# Patient Record
Sex: Male | Born: 1955
Health system: Southern US, Community
[De-identification: ages and names within clinical notes are randomized; demographics above are authoritative.]

## PROBLEM LIST (undated history)

## (undated) DIAGNOSIS — I10 Essential (primary) hypertension: Secondary | ICD-10-CM

## (undated) DIAGNOSIS — M1990 Unspecified osteoarthritis, unspecified site: Secondary | ICD-10-CM

## (undated) DIAGNOSIS — E119 Type 2 diabetes mellitus without complications: Secondary | ICD-10-CM

## (undated) HISTORY — PX: COLONOSCOPY: SHX174

## (undated) HISTORY — PX: HERNIA REPAIR: SHX51

## (undated) HISTORY — PX: ROTATOR CUFF REPAIR: SHX139

## (undated) HISTORY — PX: KNEE SURGERY: SHX244

---

## 1999-05-20 ENCOUNTER — Encounter: Payer: Self-pay | Admitting: Emergency Medicine

## 1999-05-20 ENCOUNTER — Emergency Department (HOSPITAL_COMMUNITY): Admission: EM | Admit: 1999-05-20 | Discharge: 1999-05-20 | Payer: Self-pay | Admitting: Emergency Medicine

## 2000-12-25 ENCOUNTER — Ambulatory Visit (HOSPITAL_COMMUNITY): Admission: RE | Admit: 2000-12-25 | Discharge: 2000-12-25 | Payer: Self-pay | Admitting: *Deleted

## 2000-12-25 ENCOUNTER — Encounter: Payer: Self-pay | Admitting: *Deleted

## 2002-08-18 HISTORY — PX: ROTATOR CUFF REPAIR: SHX139

## 2003-08-19 HISTORY — PX: KNEE SURGERY: SHX244

## 2008-04-27 ENCOUNTER — Ambulatory Visit (HOSPITAL_BASED_OUTPATIENT_CLINIC_OR_DEPARTMENT_OTHER): Admission: RE | Admit: 2008-04-27 | Discharge: 2008-04-27 | Payer: Self-pay | Admitting: Surgery

## 2009-02-01 ENCOUNTER — Ambulatory Visit (HOSPITAL_COMMUNITY): Admission: RE | Admit: 2009-02-01 | Discharge: 2009-02-02 | Payer: Self-pay | Admitting: Urology

## 2009-02-20 ENCOUNTER — Ambulatory Visit (HOSPITAL_COMMUNITY): Admission: RE | Admit: 2009-02-20 | Discharge: 2009-02-20 | Payer: Self-pay | Admitting: Urology

## 2010-11-25 LAB — CBC
HCT: 47.7 % (ref 39.0–52.0)
Hemoglobin: 15.6 g/dL (ref 13.0–17.0)
MCHC: 32.7 g/dL (ref 30.0–36.0)
MCV: 88 fL (ref 78.0–100.0)
Platelets: 214 10*3/uL (ref 150–400)
RBC: 5.42 MIL/uL (ref 4.22–5.81)
RDW: 12.4 % (ref 11.5–15.5)
WBC: 6.3 10*3/uL (ref 4.0–10.5)

## 2010-11-25 LAB — ABO/RH: ABO/RH(D): O POS

## 2010-11-25 LAB — PROTIME-INR
INR: 1 (ref 0.00–1.49)
Prothrombin Time: 13.3 seconds (ref 11.6–15.2)

## 2010-11-25 LAB — TYPE AND SCREEN
ABO/RH(D): O POS
Antibody Screen: NEGATIVE

## 2010-12-31 NOTE — Op Note (Signed)
NAME:  JAMAREE, HOSIER NO.:  [id number]   MEDICAL RECORD NO.:  [id number]          PATIENT TYPE:  AMB   LOCATION:  DSC                          FACILITY:  [facility]   PHYSICIAN:  Currie Paris, M.D.DATE OF BIRTH:  [birth year]   DATE OF PROCEDURE:  04/27/2008  DATE OF DISCHARGE:                               OPERATIVE REPORT   PREOPERATIVE DIAGNOSIS:  Left inguinal hernia.   POSTOPERATIVE DIAGNOSIS:  Left inguinal hernia - direct.   OPERATION:  Repair of left inguinal hernia.   SURGEON:  Currie Paris, MD   ANESTHESIA:  General.   CLINICAL HISTORY:  Mr. Lienhard is a 55 year old with a gradually  enlarging left inguinal hernia that was reducible.  He desired to have  this repaired.   DESCRIPTION OF PROCEDURE:  The patient was seen in the preoperative area  and had no further questions.  We both initialed the left side as the  operative side.   The patient was taken to the operating room and after satisfactory  general anesthesia had been obtained, the left groin area was clipped,  prepped, and draped.  The time-out was done.   To help with postop pain relief, I injected the Marcaine using 0.25%  plain along the incision line and below the fascia at the anterior-  superior iliac spine.  Incision was made and deepened to the external  oblique aponeurosis with bleeders coagulated or tied.  The aponeurosis  was opened in the line of its fibers.   The cord structures were dissected up off the inguinal floor and  surrounded with a Penrose drain.  There was a very large direct defect  with a large amount of material protruding through.  I freed this up  where it was stuck to the undersurface of the cord and stuck down to the  pubic tubercle, and I managed to get all this reduced.   At this point, I then inspected the cord and at the deep ring, there  appeared to be some preperitoneal fat (lipoma of the cord) protruding  through which I amputated and  ligated.  There was no evidence of  indirect sac.   I elected to do the repair with a mesh patch.  I first used a running 2-  0 Prolene to close the transversalis to keep the hernia reduced.  I then  took a pre-cut mesh patch and sutured it in, anchoring it to the pubic  tubercle and running a suture from medial to lateral inferiorly along  the shelving edge until I got beyond the deep ring.  He was then put  onto the anterior surface of the internal oblique and tacked down with  several sutures of Prolene.  The tails were crossed and tacked down.  This produced a nice coverage of this entire area with no tension.  I  put more Marcaine in to help with postop pain relief.  I irrigated and  made sure everything was dry.   Incision was then closed using a 3-0 Vicryl to close the external  oblique over the repair and also to close  Scarpa's.  Skin was closed  with 4-0 Monocryl subcuticular plus Dermabond.   The patient tolerated the procedure well and there were no  complications.  All counts were correct.      Currie Paris, M.D.  Electronically Signed     CJS/MEDQ  D:  04/27/2008  T:  04/27/2008  Job:  [id number]   cc:   Lianne Bushy, M.D.

## 2010-12-31 NOTE — Op Note (Signed)
NAME:  Antonio Nash, Antonio Nash NO.:  [id number]   MEDICAL RECORD NO.:  [id number]          PATIENT TYPE:  AMB   LOCATION:  DAY                          FACILITY:  [facility]   PHYSICIAN:  Martina Sinner, MD DATE OF BIRTH:  [birth year]   DATE OF PROCEDURE:  02/01/2009  DATE OF DISCHARGE:                               OPERATIVE REPORT   PREOPERATIVE DIAGNOSIS:  Urethral stricture.   POSTOPERATIVE DIAGNOSIS:  Urethral stricture.   SURGERY:  Bulbar urethroplasty plus full-thickness penile skin graft  plus cystoscopy. (surgeon: MacDiarmid; assistant: Dr Bjorn Pippin)   DESCRIPTION OF PROCEDURE:  Mr. Broad has a 2 cm mid bulbar urethral  stricture.  The patient is prepped and draped in usual fashion.  Extra  care was taken in positioning of legs.  He is given preoperative  antibiotics.  Preoperative lab tests were normal.   I initially cystoscoped and found an 8 French stricture in the mid  bulbar urethra and passed a Sensor wire followed by an open-end ureteral  catheter up into the bladder and with suprapubic pressure, I was happy  with urine return.   I then made a again with the patient in moderately high lithotomy, I  made a perineal incision and dissected sharply down to the bulbous  spongiosis muscle splitting it in the midline and mobilized the bulbar  urethra.  I carried the incision a little bit into the scrotum because  of the distal aspect of the stricture.   I used an 18-French red rubber catheter to sound the distal aspect of  the stricture.  I had Babcock in place and used a small scalpel to cut  down on the tip of the red rubber catheter.  I used DeBakey's in the  urethra and the open-end ureteral catheter to open up the urethra  approximately 4.5 cm using 3-0 silk pop offs to hold the urethra with a  double ring retractor opened with the shape of almost a hexagon.  He had  a large roof strip.  Scarring was minimal.  I cystoscoped proximally and  distally  and found it proximally and distally with a red rubber catheter  and I was back to healthy tissue.  He had a little bit of whitish  urothelium for approximately 1 cm distal to the sphincter but I did not  feel I needed to spatulate back to the sphincter.  Prostatic urethra and  verumontanum were intact.  Bladder neck was intact.   I measured approximately 4 cm with a 2 cm urethral roof strip.  I laid  out a graft with shape of a hexagon though rounded the both ends that  was approximately 4 cm x 1.7 or 8 mm.  I mobilized this on the penile  skin ventrally and using hairless skin.  I removed the graft and  defatted it with my usual technique.  I used a wax block with small  needles to do so.  The penile donor site was dry and closed with  interrupted 3-0 chromic.   With the urethra open, I placed a 3-0 Vicryl on the left  and right side  of the apex proximally and a 4-0 Vicryl on the left and right side  distally.  I placed an 18-French Foley catheter.  I then sewed the graft  to the proximal apex and then the distal apex.  I ran the 3-0 Vicryl  distally for 1.5 cm towards the meatus.  I then ran the 4-0 Vicryl down  to meet that suture on both sides.  I was picking up healthy sponge and  making sure that the suture went through healthy urothelium.  I narrowed  the graft down some, to reduce the diverticulum formation.  I was very  happy with the graft and how it was laid in with nontension manner.  I  fenestrated it minimally.   I then using interrupted 3-0 Vicryl to pick up urethral sponge tissue  and also the base of the graft in the midline and reapproximated the  urethral sponge in three locations along the 4 cm length.  I then ran a  3-0 Vicryl distally to proximally full-thickness sponge for hemostasis.   A small Jackson-Pratt drain was placed next to the urethra below the  bulbous spongiosis muscle.  Bulbous spongiosis muscle was reapproximated  with 3-0 Vicryl.  Two more layers  of soft tissue was closed with 3-0  Vicryl followed by 4-0 Vicryl for the subcuticular.  Dermabond was  applied.   Dermabond was applied with the penis on stretch to minimize any  discomfort with erections.  I then placed small Telfa followed by a very  loose Coban dressing.  Foley catheter and penis were in a cephalad  direction with mesh pants and fluff dressing at the end of the case.  Leg position was excellent.  Estimated blood loss was 300 mL or less.  The patient was stable throughout the case.   I was very happy with patient's procedure and hopefully it reached his  treatment goal.           ______________________________  Martina Sinner, MD  Electronically Signed     SAM/MEDQ  D:  02/01/2009  T:  02/01/2009  Job:  [id number]

## 2011-01-03 NOTE — H&P (Signed)
[facility]  Patient:    Antonio Nash, Antonio Nash                     MRN: [id number] Adm. Date:  12/25/00 Attending:  Radene Knee., M.D.                         History and Physical  DATE OF BIRTH:  1956-01-16  REASON FOR ADMISSION:  This patient, age 55, is scheduled on Dec 25, 2000 at [facility] for flexible cystoscopy, urethral dilation, with a chief complaint of difficulty emptying his bladder and urethral strictures dating back to 1970.  PRESENT ILLNESS:  This 55 year old male has been followed in our office since 1999 and gave a history of having strictures dating back to 22.  At that time, he was investigated, found to have a posterior urethral stricture, which was dilated, and office cystoscopy was carried out in June of 2000 and he was dilated at that time to 22-French and then he was last dilated in August of 2000 to 24-French in the office.  Patient did not return for any followup visits until he came to the office on Dec 24, 2000, reporting that he had had slowing of the urinary stream, difficulty emptying for at least four weeks. He had discomfort in his bladder and suprapubic area, some dysuria.  He had noted a red spot on his shorts but no gross blood in the urine, no fever.  His urine in the office showed 5 to 10 wbcs, some clumps, no protein, no sugar, pH was 5, and he was started on Cipro 500 mg b.i.d. and culture was obtained. The patient reports that he has been getting up twice a night and voiding every hour.  ALLERGIES:  No known allergies.  MEDICATIONS:  No medications except for the Cipro.  PAST HISTORY:  The patient had knee surgery, December 2001.  He had the office CYSTO and dilatation as noted above.  REVIEW OF SYSTEMS:  General health has been good, weight stable.  HEENT: Unremarkable.  CARDIORESPIRATORY:  No chest pain, heart attack or asthma.  GI: No hepatitis or peptic ulcer disease.  BONES, JOINTS AND  MUSCLES:  Denies any gout or arthritis.  NEUROPSYCHIATRIC:  No stroke, fainting or falling-out spells.  FAMILY HISTORY:  His father is living in his 14s.  Mother died at age 28 of cancer and renal problems.  There is diabetes in the family.  He has three siblings, four children and no other familial diseases to his knowledge.  SOCIAL HISTORY:  The patient does give a history of smoking.  He is married and has four children, does not abuse alcohol and is employed by ______, doing Architectural technologist.  PHYSICAL EXAMINATION:  GENERAL:  A well-developed, well-nourished 55 year old male with difficulty emptying his bladder.  VITAL SIGNS:  Temperature 98.2, blood pressure 140/90, pulse 78, respirations 16.  HEENT:  The ears and tympanic membranes are unremarkable.  Eyes react normally to light and accommodation.  Extraocular movements intact.  Pharynx benign. Teeth in poor condition.  NECK:  No enlargement of thyroid.  No nodes are palpable.  CHEST:  Clear to percussion and auscultation.  HEART:  Normal sinus rhythm.  No murmur.  ABDOMEN:  Mildly to moderately obese.  Liver, kidneys, spleen, masses, hernia, tenderness not detected.  GU:  The penis is circumcised.  The meatus is normal.  A #14 catheter meets an obstruction in  the posterior urethra and will not pass.  The scrotum is normal.  The testes are of good size and symmetrical.  Anus and perineum normal.  RECTAL:  Rectal tone good.  Prostate 15 to 20 g, symmetrical, smooth, bifid, firm, nontender.  EXTREMITIES:  No edema.  Good peripheral pulses.  NEUROLOGIC:  Grossly normal reflexes and sensation.  ULTRASOUND FINDINGS:  Ultrasound examination reveals no hydronephrosis, right or left, but he does have approximately 4 ounces of residual urine.  DIAGNOSES: 1. Urethral stricture. 2. Urinary tract infection, culture pending. 3. Prostatitis and benign prostatic hypertrophy.  PLAN:  The plan is to culture the urine and  start Cipro 500 mg b.i.d.  We will schedule him for CYSTO and dilation under anesthesia, Dec 25, 2000, at [place]. DD:  12/24/00 TD:  12/25/00 Job: [id number]

## 2011-01-03 NOTE — Op Note (Signed)
[facility]  Patient:    Antonio Nash, Antonio Nash                      MRN: [id number] Proc. Date: 12/25/00 Adm. Date:  [id number] Attending:  Dalbert Mayotte                           Operative Report  PREOPERATIVE DIAGNOSES: 1. Urethral stricture. 2. Urinary tract infection.  OPERATION:  Flexible cystoscopy, urethral dilation with filiformes.  DESCRIPTION OF OPERATION:  This 55 year old male with a history of stricture dating back to 1970, presented in the office with urinary tract infection and 4-6 ounces of residual urine and was placed on Cipro preoperatively and brought to the hospital Dec 25, 2000, prepped and draped in a supine position after satisfactory induction of general anesthesia and receiving 80 mg gentamicin IV.  The patients urethra was inspected with a flexible cystoscope.  He had a tight posterior urethral stricture in the posterior bulb and using the flexible cystoscope, an .038 Glidewire was passed through the tight area into the bladder, and the flexible cystoscope was then removed, and the slip-over sounds were used to dilate the stricture through 30 [place].  The bladder was then emptied with a #18 catheter, and flexible cystoscopy was carried out.  The patient had mild to moderate hyperemic erythema, a little trabeculation of the bladder, but no stone, no tumor, no ulcer was noted.  The right and left ureteral orifices appeared normal.  The bladder neck was elevated posteriorly with median bar formation.  There was no lateral lobe hypertrophy.  The stricture appeared to be just below the membranous urethra and extended for about 2 cm of the posterior bulbous urethra.  The patients anterior urethra appeared relatively normal.  The bladder was drained with a #18 Foley catheter and left indwelling, and the patient returned to the recovery area in a stable condition.  The plan is for the patient to go home on Cipro 500 b.i.d.,  Vicodin 1-2 q.4h. p.r.n. for pain, and he is to return to the office Monday or Tuesday for removal of his Foley catheter. DD:  12/25/00 TD:  12/26/00 Job: [id number]

## 2011-02-06 ENCOUNTER — Ambulatory Visit (HOSPITAL_BASED_OUTPATIENT_CLINIC_OR_DEPARTMENT_OTHER)
Admission: RE | Admit: 2011-02-06 | Discharge: 2011-02-06 | Disposition: A | Discharge status: Home / Self Care | Payer: PRIVATE HEALTH INSURANCE | Source: Ambulatory Visit | Attending: Urology | Admitting: Urology

## 2011-02-06 DIAGNOSIS — I498 Other specified cardiac arrhythmias: Secondary | ICD-10-CM | POA: Insufficient documentation

## 2011-02-06 DIAGNOSIS — I10 Essential (primary) hypertension: Secondary | ICD-10-CM | POA: Insufficient documentation

## 2011-02-06 DIAGNOSIS — Z01812 Encounter for preprocedural laboratory examination: Secondary | ICD-10-CM | POA: Insufficient documentation

## 2011-02-06 DIAGNOSIS — Z01810 Encounter for preprocedural cardiovascular examination: Secondary | ICD-10-CM | POA: Insufficient documentation

## 2011-02-06 DIAGNOSIS — N35919 Unspecified urethral stricture, male, unspecified site: Secondary | ICD-10-CM | POA: Insufficient documentation

## 2011-02-06 DIAGNOSIS — F17200 Nicotine dependence, unspecified, uncomplicated: Secondary | ICD-10-CM | POA: Insufficient documentation

## 2011-02-06 DIAGNOSIS — E119 Type 2 diabetes mellitus without complications: Secondary | ICD-10-CM | POA: Insufficient documentation

## 2011-02-06 LAB — POCT I-STAT 4, (NA,K, GLUC, HGB,HCT)
Hemoglobin: 16 g/dL (ref 13.0–17.0)
Potassium: 4.2 mEq/L (ref 3.5–5.1)
Sodium: 143 mEq/L (ref 135–145)

## 2011-02-18 NOTE — Op Note (Signed)
  NAME:  Antonio Nash, SAXER NO.:  [id number]  MEDICAL RECORD NO.:  [id number]  LOCATION:                                 FACILITY:  PHYSICIAN:  Martina Sinner, MD      DATE OF BIRTH:  DATE OF PROCEDURE:  02/06/2011 DATE OF DISCHARGE:                              OPERATIVE REPORT   PREOPERATIVE DIAGNOSIS:  Urethral stricture.  POSTOPERATIVE DIAGNOSIS:  Urethral stricture.  SURGERY:  Cystoscopy, retrograde urethrogram, balloon dilation of stricture.  The patient has the above diagnosis.  He has had a formal urethroplasty. He was given preoperative antibiotics.  The patient was prepped and draped in usual fashion.  A 17-French scope was utilized.  Penile and distal bulbar urethra were normal.  In the proximal third of his bulbar urethra, he looked to have a 14-French narrowing.  One could argue it was even larger than this, but he did not feel comfortable scoping through it.  It was in the area of the urethroplasty cystoscopically and radiographically when I pressed on the fluoro.  I was actually quite surprised that he was symptomatic from this.  Retrograde urethrogram:  I then did a retrograde urethrogram using 6- [place] open-ended ureteral catheter placed into the mid bulbar urethra. I injected approximately 10 cc of contrast and it easily went up into the bladder.  One could argue there was a few millimeters of mild narrowing approximately 1 cm distal to the membranous urethra.  It was not that impressive.  He was in an AP lithotomy position.  I had the penis oblique to the left side under stretch.  I passed a sensor wire cystoscopically into the bladder.  I passed a balloon dilation catheter to the bladder neck.  I balloon dilated under 18 atmospheres of pressure for 5 minutes.  I deflated and removed the balloon.  I cystoscoped along the wire easily into the bladder with a 17- [place] scope.  I removed the wire.  Bladder mucosa and trigone were normal.   There was no stitch, foreign body or carcinoma.  Prostatic urethra was normal.  Membranous urethra normal.  One could see that I dilated his previous urethroplasty with approximately 24-French lumen in size.  There was no perforation.  There was no bleeding.  I did not catheterize the patient.  Hopefully, this will reach the patient's treatment goal.  Recurrence rate has been discussed.          ______________________________ Martina Sinner, MD     SAM/MEDQ  D:  02/06/2011  T:  02/06/2011  Job:  [id number]  Electronically Signed by Alfredo Martinez MD on 02/18/2011 12:29:19 PM

## 2011-05-21 LAB — BASIC METABOLIC PANEL
BUN: 11
CO2: 24
Chloride: 105
Creatinine, Ser: 0.94
Potassium: 3.9

## 2011-05-21 LAB — POCT HEMOGLOBIN-HEMACUE: Hemoglobin: 14.8

## 2017-03-10 DIAGNOSIS — R9431 Abnormal electrocardiogram [ECG] [EKG]: Secondary | ICD-10-CM | POA: Diagnosis not present

## 2019-02-16 DIAGNOSIS — E119 Type 2 diabetes mellitus without complications: Secondary | ICD-10-CM | POA: Diagnosis not present

## 2019-09-20 DIAGNOSIS — Z5181 Encounter for therapeutic drug level monitoring: Secondary | ICD-10-CM | POA: Diagnosis not present

## 2019-09-20 DIAGNOSIS — E119 Type 2 diabetes mellitus without complications: Secondary | ICD-10-CM | POA: Diagnosis not present

## 2019-09-20 DIAGNOSIS — Z7984 Long term (current) use of oral hypoglycemic drugs: Secondary | ICD-10-CM | POA: Diagnosis not present

## 2019-09-20 DIAGNOSIS — I10 Essential (primary) hypertension: Secondary | ICD-10-CM | POA: Diagnosis not present

## 2019-12-19 DIAGNOSIS — E119 Type 2 diabetes mellitus without complications: Secondary | ICD-10-CM | POA: Diagnosis not present

## 2020-02-02 DIAGNOSIS — M160 Bilateral primary osteoarthritis of hip: Secondary | ICD-10-CM | POA: Diagnosis not present

## 2020-02-02 DIAGNOSIS — M47816 Spondylosis without myelopathy or radiculopathy, lumbar region: Secondary | ICD-10-CM | POA: Diagnosis not present

## 2020-02-06 DIAGNOSIS — M1612 Unilateral primary osteoarthritis, left hip: Secondary | ICD-10-CM | POA: Diagnosis not present

## 2020-03-19 DIAGNOSIS — I10 Essential (primary) hypertension: Secondary | ICD-10-CM | POA: Diagnosis not present

## 2020-03-19 DIAGNOSIS — M25512 Pain in left shoulder: Secondary | ICD-10-CM | POA: Diagnosis not present

## 2020-03-19 DIAGNOSIS — E1140 Type 2 diabetes mellitus with diabetic neuropathy, unspecified: Secondary | ICD-10-CM | POA: Diagnosis not present

## 2020-03-19 DIAGNOSIS — M25552 Pain in left hip: Secondary | ICD-10-CM | POA: Diagnosis not present

## 2020-03-27 DIAGNOSIS — Z7984 Long term (current) use of oral hypoglycemic drugs: Secondary | ICD-10-CM | POA: Diagnosis not present

## 2020-03-27 DIAGNOSIS — E119 Type 2 diabetes mellitus without complications: Secondary | ICD-10-CM | POA: Diagnosis not present

## 2020-03-27 DIAGNOSIS — Z5181 Encounter for therapeutic drug level monitoring: Secondary | ICD-10-CM | POA: Diagnosis not present

## 2020-04-17 DIAGNOSIS — M1612 Unilateral primary osteoarthritis, left hip: Secondary | ICD-10-CM | POA: Diagnosis not present

## 2020-04-17 DIAGNOSIS — Z01811 Encounter for preprocedural respiratory examination: Secondary | ICD-10-CM | POA: Diagnosis not present

## 2020-04-17 DIAGNOSIS — Z01818 Encounter for other preprocedural examination: Secondary | ICD-10-CM | POA: Diagnosis not present

## 2020-04-17 DIAGNOSIS — M25552 Pain in left hip: Secondary | ICD-10-CM | POA: Diagnosis not present

## 2020-04-17 DIAGNOSIS — Z01812 Encounter for preprocedural laboratory examination: Secondary | ICD-10-CM | POA: Diagnosis not present

## 2020-04-17 DIAGNOSIS — Z01810 Encounter for preprocedural cardiovascular examination: Secondary | ICD-10-CM | POA: Diagnosis not present

## 2020-04-30 DIAGNOSIS — E119 Type 2 diabetes mellitus without complications: Secondary | ICD-10-CM | POA: Diagnosis not present

## 2020-04-30 DIAGNOSIS — Z5181 Encounter for therapeutic drug level monitoring: Secondary | ICD-10-CM | POA: Diagnosis not present

## 2020-04-30 DIAGNOSIS — Z7984 Long term (current) use of oral hypoglycemic drugs: Secondary | ICD-10-CM | POA: Diagnosis not present

## 2020-05-08 DIAGNOSIS — E119 Type 2 diabetes mellitus without complications: Secondary | ICD-10-CM | POA: Diagnosis not present

## 2020-07-09 DIAGNOSIS — Z0279 Encounter for issue of other medical certificate: Secondary | ICD-10-CM

## 2020-07-16 ENCOUNTER — Emergency Department (HOSPITAL_COMMUNITY): Payer: No Typology Code available for payment source

## 2020-07-16 ENCOUNTER — Other Ambulatory Visit: Payer: Self-pay

## 2020-07-16 ENCOUNTER — Encounter (HOSPITAL_COMMUNITY): Payer: Self-pay | Admitting: Emergency Medicine

## 2020-07-16 ENCOUNTER — Emergency Department (HOSPITAL_COMMUNITY)
Admission: EM | Admit: 2020-07-16 | Discharge: 2020-07-16 | Disposition: A | Discharge status: Home / Self Care | Payer: No Typology Code available for payment source | Attending: Emergency Medicine | Admitting: Emergency Medicine

## 2020-07-16 DIAGNOSIS — E119 Type 2 diabetes mellitus without complications: Secondary | ICD-10-CM | POA: Insufficient documentation

## 2020-07-16 DIAGNOSIS — Z20822 Contact with and (suspected) exposure to covid-19: Secondary | ICD-10-CM | POA: Diagnosis not present

## 2020-07-16 DIAGNOSIS — I10 Essential (primary) hypertension: Secondary | ICD-10-CM | POA: Diagnosis not present

## 2020-07-16 DIAGNOSIS — N3000 Acute cystitis without hematuria: Secondary | ICD-10-CM | POA: Diagnosis not present

## 2020-07-16 DIAGNOSIS — M791 Myalgia, unspecified site: Secondary | ICD-10-CM | POA: Insufficient documentation

## 2020-07-16 DIAGNOSIS — R509 Fever, unspecified: Secondary | ICD-10-CM | POA: Diagnosis not present

## 2020-07-16 DIAGNOSIS — R0602 Shortness of breath: Secondary | ICD-10-CM | POA: Diagnosis present

## 2020-07-16 HISTORY — DX: Type 2 diabetes mellitus without complications: E11.9

## 2020-07-16 HISTORY — DX: Essential (primary) hypertension: I10

## 2020-07-16 LAB — CBC WITH DIFFERENTIAL/PLATELET
Abs Immature Granulocytes: 0.04 10*3/uL (ref 0.00–0.07)
Basophils Absolute: 0 10*3/uL (ref 0.0–0.1)
Basophils Relative: 0 %
Eosinophils Absolute: 0 10*3/uL (ref 0.0–0.5)
Eosinophils Relative: 0 %
HCT: 44.4 % (ref 39.0–52.0)
Hemoglobin: 14 g/dL (ref 13.0–17.0)
Immature Granulocytes: 0 %
Lymphocytes Relative: 4 %
Lymphs Abs: 0.3 10*3/uL — ABNORMAL LOW (ref 0.7–4.0)
MCH: 28.1 pg (ref 26.0–34.0)
MCHC: 31.5 g/dL (ref 30.0–36.0)
MCV: 89 fL (ref 80.0–100.0)
Monocytes Absolute: 0.3 10*3/uL (ref 0.1–1.0)
Monocytes Relative: 3 %
Neutro Abs: 8.2 10*3/uL — ABNORMAL HIGH (ref 1.7–7.7)
Neutrophils Relative %: 93 %
Platelets: 171 10*3/uL (ref 150–400)
RBC: 4.99 MIL/uL (ref 4.22–5.81)
RDW: 13 % (ref 11.5–15.5)
WBC: 8.9 10*3/uL (ref 4.0–10.5)
nRBC: 0 % (ref 0.0–0.2)

## 2020-07-16 LAB — COMPREHENSIVE METABOLIC PANEL
ALT: 31 U/L (ref 0–44)
AST: 21 U/L (ref 15–41)
Albumin: 3.5 g/dL (ref 3.5–5.0)
Alkaline Phosphatase: 42 U/L (ref 38–126)
Anion gap: 13 (ref 5–15)
BUN: 21 mg/dL (ref 8–23)
CO2: 22 mmol/L (ref 22–32)
Calcium: 8.4 mg/dL — ABNORMAL LOW (ref 8.9–10.3)
Chloride: 101 mmol/L (ref 98–111)
Creatinine, Ser: 1.32 mg/dL — ABNORMAL HIGH (ref 0.61–1.24)
GFR, Estimated: 59 mL/min — ABNORMAL LOW (ref >60)
Glucose, Bld: 153 mg/dL — ABNORMAL HIGH (ref 70–99)
Potassium: 3.4 mmol/L — ABNORMAL LOW (ref 3.5–5.1)
Sodium: 136 mmol/L (ref 135–145)
Total Bilirubin: 1.1 mg/dL (ref 0.3–1.2)
Total Protein: 6.5 g/dL (ref 6.5–8.1)

## 2020-07-16 LAB — URINALYSIS, ROUTINE W REFLEX MICROSCOPIC
Bilirubin Urine: NEGATIVE
Glucose, UA: >=500 mg/dL — AB
Ketones, ur: 5 mg/dL — AB
Nitrite: NEGATIVE
Protein, ur: 30 mg/dL — AB
Specific Gravity, Urine: 1.029 (ref 1.005–1.030)
WBC, UA: >50 WBC/hpf — ABNORMAL HIGH (ref 0–5)
pH: 5 (ref 5.0–8.0)

## 2020-07-16 LAB — RESP PANEL BY RT-PCR (FLU A&B, COVID) ARPGX2
Influenza A by PCR: NEGATIVE
Influenza B by PCR: NEGATIVE
SARS Coronavirus 2 by RT PCR: NEGATIVE

## 2020-07-16 LAB — LACTIC ACID, PLASMA: Lactic Acid, Venous: 1.6 mmol/L (ref 0.5–1.9)

## 2020-07-16 MED ORDER — SODIUM CHLORIDE 0.9 % IV BOLUS (SEPSIS)
1000.0000 mL | Freq: Once | INTRAVENOUS | Status: AC
  Administered 2020-07-16: 1000 mL via INTRAVENOUS

## 2020-07-16 MED ORDER — SODIUM CHLORIDE 0.9 % IV SOLN
1.0000 g | Freq: Once | INTRAVENOUS | Status: AC
  Administered 2020-07-16: 1 g via INTRAVENOUS
  Filled 2020-07-16: qty 10

## 2020-07-16 MED ORDER — CEPHALEXIN 500 MG PO CAPS: 500.0000 mg | ORAL_CAPSULE | Freq: Two times a day (BID) | ORAL | 0 refills | Status: DC

## 2020-07-16 MED ORDER — ACETAMINOPHEN 500 MG PO TABS
1000.0000 mg | ORAL_TABLET | Freq: Once | ORAL | Status: AC
  Administered 2020-07-16: 1000 mg via ORAL
  Filled 2020-07-16: qty 2

## 2020-07-16 NOTE — ED Triage Notes (Signed)
Pt c/o difficulty breathing, fever up to 103 at home, chills, body aches, pt took tylenol PTA. No known sick contacts, +covid vaccine.

## 2020-07-16 NOTE — ED Notes (Signed)
Wife at bedside.

## 2020-07-16 NOTE — Discharge Instructions (Addendum)
You may take Tylenol 1000 mg every 6 hours as needed for pain and fever.  Your labs, chest x-ray today were reassuring.  Your Covid and flu test were negative.  Your urine appears infected today and likely the cause of your fever.  Steps to find a Primary Care Provider (PCP):  Call [phone] or [phone] to access "[place] Find a Doctor Service."  2.  You may also go on the [facility] website at [email or web address]  3.  [facility] also frequently accepts new patients.  [facility]  [address] [place] [address] [phone]  4.  There are also multiple [facility], [facility] and [facility] practices throughout the [place] that are frequently accepting new patients. You may find a clinic that is close to your home and contact them.  [facility] [email or web address] [phone]  [facility] [email or web address]  [email or web address] [phone]  [facility] [email or web address] [phone]  5.  Local Health Departments also can provide primary care services.  [facility]  [address] [place], [place] [address] [phone]  [facility] Department [address] [place] [address] [phone]  [facility] [address] [place] [address]  [place] [address] [phone]

## 2020-07-16 NOTE — ED Provider Notes (Addendum)
TIME SEEN: 4:53 AM  CHIEF COMPLAINT: Fever, nasal congestion, chills, body aches  HPI: Patient is a 64 year old male with history of hypertension, diabetes who presents to the emergency department with fever of 103 at home, shaking chills, body aches, nasal congestion.  States he is having difficulty breathing through his nose but no difficulty breathing through his mouth.  No cough.  No vomiting or diarrhea.  No dysuria or hematuria.  No headache, neck pain or neck stiffness.  No known sick contacts but did recently visit family in [place].  Has had 2 COVID-19 vaccinations.  Took Aleve and ibuprofen prior to arrival.  ROS: See HPI Constitutional:  fever  Eyes: no drainage  ENT:  runny nose   Cardiovascular:  no chest pain  Resp: no SOB  GI: no vomiting GU: no dysuria Integumentary: no rash  Allergy: no hives  Musculoskeletal: no leg swelling  Neurological: no slurred speech ROS otherwise negative  PAST MEDICAL HISTORY/PAST SURGICAL HISTORY:  Past Medical History:  Diagnosis Date   Diabetes mellitus without complication (HCC)    Hypertension     MEDICATIONS:  Prior to Admission medications   Not on File    ALLERGIES:  Allergies  Allergen Reactions   Codeine     SOCIAL HISTORY:  Social History   Tobacco Use   Smoking status: Never Smoker   Smokeless tobacco: Never Used  Substance Use Topics   Alcohol use: Not Currently    FAMILY HISTORY: No family history on file.  EXAM: BP 105/77 (BP Location: Right Arm)    Pulse (!) 110    Temp (!) 100.5 F (38.1 C) (Oral)    Resp 17    SpO2 95%  CONSTITUTIONAL: Alert and oriented and responds appropriately to questions. Well-appearing; well-nourished, nontoxic-appearing, well-hydrated HEAD: Normocephalic EYES: Conjunctivae clear, pupils appear equal, EOM appear intact ENT: normal nose; moist mucous membranes NECK: Supple, normal ROM CARD: Regular and tachycardic; S1 and S2 appreciated; no murmurs, no clicks, no  rubs, no gallops RESP: Normal chest excursion without splinting or tachypnea; breath sounds clear and equal bilaterally; no wheezes, no rhonchi, no rales, no hypoxia or respiratory distress, speaking full sentences ABD/GI: Normal bowel sounds; non-distended; soft, non-tender, no rebound, no guarding, no peritoneal signs, no hepatosplenomegaly BACK:  The back appears normal EXT: Normal ROM in all joints; no deformity noted, no edema; no cyanosis SKIN: Normal color for age and race; warm; no rash on exposed skin NEURO: Moves all extremities equally PSYCH: The patient's mood and manner are appropriate.   MEDICAL DECISION MAKING: Patient here with fever, chills, body aches.  Suspect viral illness.  Will test for COVID-19 and influenza.  Will give Tylenol here.  Will obtain labs, urine, cultures to ensure no bacterial source.  He is febrile and minimally tachycardic here but very well-appearing and I have low suspicion for sepsis at this time.  Will hold on IV fluids and antibiotics until we have more information.  ED PROGRESS: Patient's labs are reassuring.  Normal lactate.  Covid and flu swabs negative.  Chest x-ray clear.  Urine does show moderate leukocytes, greater than 50,000 white blood cells and many bacteria.  No previous urine cultures in our system.  Urine and blood cultures pending.  Continues to be well-appearing.  Vital signs have improved with antipyretics.  Will give dose of IV Rocephin but I feel patient is safe for discharge home on oral antibiotics.  He is comfortable with this plan.  Minimally elevated creatinine compared to 2012.  Unclear what his baseline is but will give 1 L of IV fluid here prior to discharge.   At this time, I do not feel there is any life-threatening condition present. I have reviewed, interpreted and discussed all results (EKG, imaging, lab, urine as appropriate) and exam findings with patient/family. I have reviewed nursing notes and appropriate previous  records.  I feel the patient is safe to be discharged home without further emergent workup and can continue workup as an outpatient as needed. Discussed usual and customary return precautions. Patient/family verbalize understanding and are comfortable with this plan.  Outpatient follow-up has been provided as needed. All questions have been answered.     EKG Interpretation  Date/Time:  Monday July 16 2020 05:21:58 EST Ventricular Rate:  95 PR Interval:    QRS Duration: 91 QT Interval:  337 QTC Calculation: 424 R Axis:   20 Text Interpretation: Sinus rhythm Borderline repolarization abnormality Confirmed by Rochele Raring [phone] on 07/16/2020 5:54:34 AM         Antonio Nash was evaluated in Emergency Department on 07/16/2020 for the symptoms described in the history of present illness. He was evaluated in the context of the global COVID-19 pandemic, which necessitated consideration that the patient might be at risk for infection with the SARS-CoV-2 virus that causes COVID-19. Institutional protocols and algorithms that pertain to the evaluation of patients at risk for COVID-19 are in a state of rapid change based on information released by regulatory bodies including the CDC and federal and state organizations. These policies and algorithms were followed during the patient's care in the ED.      Antonio Nash, Antonio Maw, DO 07/16/20 0743    Antonio Nash, Antonio Maw, DO 07/16/20 [phone]

## 2020-07-16 NOTE — ED Notes (Signed)
Patient verbalized understanding of discharge instructions. Opportunity for questions and answers.  

## 2020-07-17 LAB — URINE CULTURE

## 2020-07-17 LAB — CULTURE, BLOOD (ROUTINE X 2): Special Requests: ADEQUATE

## 2020-07-18 LAB — URINE CULTURE: Culture: 80000 — AB

## 2020-07-19 ENCOUNTER — Emergency Department (HOSPITAL_COMMUNITY)
Admission: EM | Admit: 2020-07-19 | Discharge: 2020-07-20 | Disposition: A | Discharge status: Home / Self Care | Payer: No Typology Code available for payment source | Attending: Emergency Medicine | Admitting: Emergency Medicine

## 2020-07-19 ENCOUNTER — Encounter (HOSPITAL_COMMUNITY): Payer: Self-pay | Admitting: Emergency Medicine

## 2020-07-19 ENCOUNTER — Other Ambulatory Visit: Payer: Self-pay

## 2020-07-19 ENCOUNTER — Telehealth: Payer: Self-pay | Admitting: *Deleted

## 2020-07-19 ENCOUNTER — Ambulatory Visit (HOSPITAL_COMMUNITY)
Admission: EM | Admit: 2020-07-19 | Discharge: 2020-07-19 | Disposition: A | Discharge status: Home / Self Care | Payer: No Typology Code available for payment source | Attending: Emergency Medicine | Admitting: Emergency Medicine

## 2020-07-19 DIAGNOSIS — R5383 Other fatigue: Secondary | ICD-10-CM | POA: Insufficient documentation

## 2020-07-19 DIAGNOSIS — I10 Essential (primary) hypertension: Secondary | ICD-10-CM | POA: Insufficient documentation

## 2020-07-19 DIAGNOSIS — S29012A Strain of muscle and tendon of back wall of thorax, initial encounter: Secondary | ICD-10-CM | POA: Insufficient documentation

## 2020-07-19 DIAGNOSIS — N3001 Acute cystitis with hematuria: Secondary | ICD-10-CM | POA: Diagnosis not present

## 2020-07-19 DIAGNOSIS — N12 Tubulo-interstitial nephritis, not specified as acute or chronic: Secondary | ICD-10-CM

## 2020-07-19 DIAGNOSIS — R059 Cough, unspecified: Secondary | ICD-10-CM

## 2020-07-19 DIAGNOSIS — M549 Dorsalgia, unspecified: Secondary | ICD-10-CM

## 2020-07-19 DIAGNOSIS — R509 Fever, unspecified: Secondary | ICD-10-CM

## 2020-07-19 DIAGNOSIS — T148XXA Other injury of unspecified body region, initial encounter: Secondary | ICD-10-CM

## 2020-07-19 DIAGNOSIS — E119 Type 2 diabetes mellitus without complications: Secondary | ICD-10-CM | POA: Insufficient documentation

## 2020-07-19 DIAGNOSIS — S299XXA Unspecified injury of thorax, initial encounter: Secondary | ICD-10-CM | POA: Diagnosis present

## 2020-07-19 DIAGNOSIS — X58XXXA Exposure to other specified factors, initial encounter: Secondary | ICD-10-CM | POA: Insufficient documentation

## 2020-07-19 DIAGNOSIS — S39012A Strain of muscle, fascia and tendon of lower back, initial encounter: Secondary | ICD-10-CM | POA: Diagnosis not present

## 2020-07-19 DIAGNOSIS — Z79899 Other long term (current) drug therapy: Secondary | ICD-10-CM | POA: Diagnosis not present

## 2020-07-19 LAB — POCT URINALYSIS DIPSTICK, ED / UC
Glucose, UA: NEGATIVE mg/dL
Ketones, ur: 80 mg/dL — AB
Leukocytes,Ua: NEGATIVE
Nitrite: NEGATIVE
Protein, ur: >=300 mg/dL — AB
Specific Gravity, Urine: >=1.03 (ref 1.005–1.030)
Urobilinogen, UA: 1 mg/dL (ref 0.0–1.0)
pH: 5 (ref 5.0–8.0)

## 2020-07-19 LAB — CULTURE, BLOOD (ROUTINE X 2): Special Requests: ADEQUATE

## 2020-07-19 MED ORDER — ACETAMINOPHEN 500 MG PO TABS
1000.0000 mg | ORAL_TABLET | Freq: Once | ORAL | Status: AC
  Administered 2020-07-20: 1000 mg via ORAL
  Filled 2020-07-19: qty 2

## 2020-07-19 NOTE — ED Triage Notes (Signed)
Pt c/o back pain onset yesterday. Pt states he was coughing and felt a sharp pull across his back. Pt states he feels like after he had a covid test he starting feeling sinus pressure.

## 2020-07-19 NOTE — Telephone Encounter (Signed)
Post ED Visit - Positive Culture Follow-up: Unsuccessful Patient Follow-up  Culture assessed and recommendations reviewed by:  []  , Pharm.D. []  Enzo Bi, Pharm.D., BCPS AQ-ID []  , Pharm.D., BCPS []  Celedonio Miyamoto, .D., BCPS []  [place], .D., BCPS, AAHIVP []  Georgina Pillion, Pharm.D., BCPS, AAHIVP []  [address], PharmD []  , PharmD, BCPS  Positive urine culture  []  Patient discharged without antimicrobial prescription and treatment is now indicated [x]  Organism is resistant to prescribed ED discharge antimicrobial []  Patient with positive blood cultures  Plan:  Stop Cephalexin and start Cefdinir 300mg  po BID x 10 days, [place], PA-C Unable to contact patient after 3 attempts, letter will be sent to address on file  [address] 07/19/2020, 10:06 AM

## 2020-07-19 NOTE — ED Provider Notes (Signed)
____________________________________________  Time seen: Approximately 8:20 PM  I have reviewed the triage vital signs and the nursing notes.   HISTORY  Chief Complaint Fever, Back Pain, and Cough   Historian Patient    HPI Antonio Nash is a 64 y.o. male with a history of diabetes and hypertension recently diagnosed with a febrile UTI, presents to the urgent care after 5 days of Keflex with worsening fever, malaise and low back pain.  Patient had IV Rocephin at emergency department encounter on 07/16/2020 and has not felt improved.  He denies chest pain, chest tightness or abdominal pain.  Patient is accompanied by his wife.  Urine culture obtained at emergency department encounter shows E. coli growth.   Past Medical History:  Diagnosis Date  . Diabetes mellitus without complication (HCC)   . Hypertension      Immunizations up to date:  Yes.     Past Medical History:  Diagnosis Date  . Diabetes mellitus without complication (HCC)   . Hypertension     There are no problems to display for this patient.   History reviewed. No pertinent surgical history.  Prior to Admission medications   Medication Sig Start Date End Date Taking? Authorizing Provider  cephALEXin (KEFLEX) 500 MG capsule Take 1 capsule (500 mg total) by mouth 2 (two) times daily. 07/16/20  Yes Ward, [name], DO    Allergies Codeine  History reviewed. No pertinent family history.  Social History Social History   Tobacco Use  . Smoking status: Never Smoker  . Smokeless tobacco: Never Used  Substance Use Topics  . Alcohol use: Not Currently  . Drug use: Never     Review of Systems  Constitutional: Patient has fever.  Eyes:  No discharge ENT: No upper respiratory complaints. Respiratory: no cough. No SOB/ use of accessory muscles to breath Gastrointestinal:   No nausea, no vomiting.  No diarrhea.  No constipation. Musculoskeletal: Patient has back pain.  Skin: Negative for rash,  abrasions, lacerations, ecchymosis.    ____________________________________________   PHYSICAL EXAM:  VITAL SIGNS: ED Triage Vitals  Enc Vitals Group     BP 07/19/20 1941 (!) 218/203     Pulse Rate 07/19/20 1941 (!) 110     Resp --      Temp 07/19/20 1941 (!) 101.9 F (38.8 C)     Temp Source 07/19/20 1941 Oral     SpO2 07/19/20 1941 97 %     Weight --      Height --      Head Circumference --      Peak Flow --      Pain Score 07/19/20 1939 9     Pain Loc --      Pain Edu? --      Excl. in GC? --      Constitutional: Alert and oriented. Well appearing and in no acute distress. Eyes: Conjunctivae are normal. PERRL. EOMI. Head: Atraumatic. ENT:      Nose: No congestion/rhinnorhea.      Mouth/Throat: Mucous membranes are moist.  Neck: No stridor.  No cervical spine tenderness to palpation. Cardiovascular: Normal rate, regular rhythm. Normal S1 and S2.  Good peripheral circulation. Respiratory: Normal respiratory effort without tachypnea or retractions. Lungs CTAB. Good air entry to the bases with no decreased or absent breath sounds Gastrointestinal: Bowel sounds x 4 quadrants. Soft and nontender to palpation. No guarding or rigidity. No distention. Patient has CVA tenderness.  Musculoskeletal: Full range of motion to all extremities.  No obvious deformities noted Neurologic:  Normal for age. No gross focal neurologic deficits are appreciated.  Skin:  Skin is warm, dry and intact. No rash noted. Psychiatric: Mood and affect are normal for age. Speech and behavior are normal.   ____________________________________________   LABS (all labs ordered are listed, but only abnormal results are displayed)  Labs Reviewed  POCT URINALYSIS DIPSTICK, ED / UC - Abnormal; Notable for the following components:      Result Value   Bilirubin Urine SMALL (*)    Ketones, ur 80 (*)    Hgb urine dipstick MODERATE (*)    Protein, ur >=300 (*)    All other components within normal  limits   ____________________________________________  EKG   ____________________________________________  RADIOLOGY  No results found.  ____________________________________________    PROCEDURES  Procedure(s) performed:     Procedures     Medications - No data to display   ____________________________________________   INITIAL IMPRESSION / ASSESSMENT AND PLAN / ED COURSE  Pertinent labs & imaging results that were available during my care of the patient were reviewed by me and considered in my medical decision making (see chart for details).      Assessment and Plan:  Pyelonephritis Fever 64 year old male presents to the urgent care with 5 days of fever currently on Keflex with a urine culture that indicates E. Coli growth  Patient was febrile, tachycardic and extremely hypertensive at triage.  Patient was referred to the emergency department for further care and management as patient will likely need admission for failed outpatient treatment for a urinary tract infection which now clinically suggest pyelonephritis.  Patient education regarding shortest available wait times were given.  Patient's wife feels comfortable taking patient to the emergency department.    ____________________________________________  FINAL CLINICAL IMPRESSION(S) / ED DIAGNOSES  Final diagnoses:  Fever, unspecified fever cause  Pyelonephritis      NEW MEDICATIONS STARTED DURING THIS VISIT:  ED Discharge Orders    None          This chart was dictated using voice recognition software/Dragon. Despite best efforts to proofread, errors can occur which can change the meaning. Any change was purely unintentional.     Orvil Feil, PA-C 07/19/20 2025

## 2020-07-19 NOTE — ED Triage Notes (Signed)
Pt sent by PCP for possible kidney infection from previously dx UTI. Pt was dx with UTI on Monday and was rx'd abx. Pt now c/o mid-upper back pain on both sides. Denies urinary symptoms. Denies N/V/D.

## 2020-07-19 NOTE — Discharge Instructions (Signed)
Please go straight to emergency department for further care and management.  

## 2020-07-20 LAB — CBC WITH DIFFERENTIAL/PLATELET
Abs Immature Granulocytes: 0.1 10*3/uL — ABNORMAL HIGH (ref 0.00–0.07)
Basophils Absolute: 0.1 10*3/uL (ref 0.0–0.1)
Basophils Relative: 1 %
Eosinophils Absolute: 0 10*3/uL (ref 0.0–0.5)
Eosinophils Relative: 0 %
HCT: 41.1 % (ref 39.0–52.0)
Hemoglobin: 13.4 g/dL (ref 13.0–17.0)
Immature Granulocytes: 1 %
Lymphocytes Relative: 12 %
Lymphs Abs: 1.1 10*3/uL (ref 0.7–4.0)
MCH: 28.6 pg (ref 26.0–34.0)
MCHC: 32.6 g/dL (ref 30.0–36.0)
MCV: 87.6 fL (ref 80.0–100.0)
Monocytes Absolute: 1.1 10*3/uL — ABNORMAL HIGH (ref 0.1–1.0)
Monocytes Relative: 12 %
Neutro Abs: 6.9 10*3/uL (ref 1.7–7.7)
Neutrophils Relative %: 74 %
Platelets: 162 10*3/uL (ref 150–400)
RBC: 4.69 MIL/uL (ref 4.22–5.81)
RDW: 13.1 % (ref 11.5–15.5)
WBC: 9.2 10*3/uL (ref 4.0–10.5)
nRBC: 0 % (ref 0.0–0.2)

## 2020-07-20 LAB — COMPREHENSIVE METABOLIC PANEL
ALT: 30 U/L (ref 0–44)
AST: 18 U/L (ref 15–41)
Albumin: 3.3 g/dL — ABNORMAL LOW (ref 3.5–5.0)
Alkaline Phosphatase: 45 U/L (ref 38–126)
Anion gap: 11 (ref 5–15)
BUN: 19 mg/dL (ref 8–23)
CO2: 22 mmol/L (ref 22–32)
Calcium: 8.6 mg/dL — ABNORMAL LOW (ref 8.9–10.3)
Chloride: 100 mmol/L (ref 98–111)
Creatinine, Ser: 1.03 mg/dL (ref 0.61–1.24)
GFR, Estimated: >60 mL/min (ref >60)
Glucose, Bld: 177 mg/dL — ABNORMAL HIGH (ref 70–99)
Potassium: 3.6 mmol/L (ref 3.5–5.1)
Sodium: 133 mmol/L — ABNORMAL LOW (ref 135–145)
Total Bilirubin: 1.2 mg/dL (ref 0.3–1.2)
Total Protein: 7 g/dL (ref 6.5–8.1)

## 2020-07-20 LAB — LACTIC ACID, PLASMA: Lactic Acid, Venous: 0.9 mmol/L (ref 0.5–1.9)

## 2020-07-20 MED ORDER — FENTANYL CITRATE (PF) 100 MCG/2ML IJ SOLN
50.0000 ug | Freq: Once | INTRAMUSCULAR | Status: AC
  Administered 2020-07-20: 50 ug via INTRAVENOUS
  Filled 2020-07-20: qty 2

## 2020-07-20 MED ORDER — SODIUM CHLORIDE 0.9 % IV BOLUS (SEPSIS)
1000.0000 mL | Freq: Once | INTRAVENOUS | Status: AC
  Administered 2020-07-20: 1000 mL via INTRAVENOUS

## 2020-07-20 MED ORDER — SULFAMETHOXAZOLE-TRIMETHOPRIM 800-160 MG PO TABS: 1.0000 | ORAL_TABLET | Freq: Two times a day (BID) | ORAL | 0 refills | Status: AC

## 2020-07-20 MED ORDER — SODIUM CHLORIDE 0.9 % IV SOLN
1000.0000 mL | INTRAVENOUS | Status: DC
  Administered 2020-07-20: 1000 mL via INTRAVENOUS

## 2020-07-20 MED ORDER — SULFAMETHOXAZOLE-TRIMETHOPRIM 800-160 MG PO TABS
1.0000 | ORAL_TABLET | Freq: Once | ORAL | Status: AC
  Administered 2020-07-20: 1 via ORAL
  Filled 2020-07-20: qty 1

## 2020-07-20 NOTE — ED Provider Notes (Signed)
[facility]-EMERGENCY DEPT Provider Note  CSN: [id number] Arrival date & time: 07/19/20 2034  Chief Complaint(s) Flank Pain  HPI Antonio Nash is a 64 y.o. male with a history of hypertension and diabetes recently diagnosed with a urinary tract infection currently on Keflex who presents to the emergency department for new onset back pain.  He was sent from urgent care for further evaluation.  He reports that the pain began after he sneezed.  Pain location is from the left mid thoracic back.  Pain is worse with palpation and movement.  He denies any falls or traumas.  No chest pain or shortness of breath.  No flank pain.  No abdominal pain.  No nausea or vomiting.  Patient is feeling fatigued.  Still having urinary symptoms.  HPI  Past Medical History Past Medical History:  Diagnosis Date  . Diabetes mellitus without complication (HCC)   . Hypertension    There are no problems to display for this patient.  Home Medication(s) Prior to Admission medications   Medication Sig Start Date End Date Taking? Authorizing Provider  carvedilol (COREG) 12.5 MG tablet Take 12.5 mg by mouth in the morning and at bedtime. 08/03/17  Yes [provider]  lisinopril (ZESTRIL) 40 MG tablet Take 1 tablet by mouth daily. 08/03/17  Yes [provider]  sulfamethoxazole-trimethoprim (BACTRIM DS) 800-160 MG tablet Take 1 tablet by mouth 2 (two) times daily for 10 days. 07/20/20 07/30/20  Nira Conn, MD                                                                                                                                    Past Surgical History History reviewed. No pertinent surgical history. Family History No family history on file.  Social History Social History   Tobacco Use  . Smoking status: Never Smoker  . Smokeless tobacco: Never Used  Substance Use Topics  . Alcohol use: Not Currently  . Drug use: Never   Allergies Codeine  Review  of Systems Review of Systems All other systems are reviewed and are negative for acute change except as noted in the HPI  Physical Exam Vital Signs  I have reviewed the triage vital signs BP (!) 141/80 (BP Location: Right Arm)   Pulse (!) 101   Temp (!) 101.7 F (38.7 C) (Oral)   Resp 16   Ht 5\' 9"  (1.753 m)   Wt 92.5 kg   SpO2 98%   BMI 30.13 kg/m   Physical Exam Vitals reviewed.  Constitutional:      General: He is not in acute distress.    Appearance: He is well-developed. He is not diaphoretic.  HENT:     Head: Normocephalic and atraumatic.     Nose: Nose normal.  Eyes:     General: No scleral icterus.       Right eye: No discharge.  Left eye: No discharge.     Conjunctiva/sclera: Conjunctivae normal.     Pupils: Pupils are equal, round, and reactive to light.  Cardiovascular:     Rate and Rhythm: Normal rate and regular rhythm.     Heart sounds: No murmur heard.  No friction rub. No gallop.   Pulmonary:     Effort: Pulmonary effort is normal. No respiratory distress.     Breath sounds: Normal breath sounds. No stridor. No rales.  Abdominal:     General: There is no distension.     Palpations: Abdomen is soft.     Tenderness: There is no abdominal tenderness. There is no right CVA tenderness or left CVA tenderness.  Musculoskeletal:     Cervical back: Normal range of motion and neck supple.     Thoracic back: Spasms and tenderness present.       Back:  Skin:    General: Skin is warm and dry.     Findings: No erythema or rash.  Neurological:     Mental Status: He is alert and oriented to person, place, and time.     ED Results and Treatments Labs (all labs ordered are listed, but only abnormal results are displayed) Labs Reviewed  CBC WITH DIFFERENTIAL/PLATELET - Abnormal; Notable for the following components:      Result Value   Monocytes Absolute 1.1 (*)    Abs Immature Granulocytes 0.10 (*)    All other components within normal limits   COMPREHENSIVE METABOLIC PANEL - Abnormal; Notable for the following components:   Sodium 133 (*)    Glucose, Bld 177 (*)    Calcium 8.6 (*)    Albumin 3.3 (*)    All other components within normal limits  LACTIC ACID, PLASMA                                                                                                                         EKG  EKG Interpretation  Date/Time:    Ventricular Rate:    PR Interval:    QRS Duration:   QT Interval:    QTC Calculation:   R Axis:     Text Interpretation:        Radiology No results found.  Pertinent labs & imaging results that were available during my care of the patient were reviewed by me and considered in my medical decision making (see chart for details).  Medications Ordered in ED Medications  sodium chloride 0.9 % bolus 1,000 mL (0 mLs Intravenous Stopped 07/20/20 0421)    Followed by  0.9 %  sodium chloride infusion (1,000 mLs Intravenous New Bag/Given 07/20/20 0329)  acetaminophen (TYLENOL) tablet 1,000 mg (1,000 mg Oral Given 07/20/20 0008)  fentaNYL (SUBLIMAZE) injection 50 mcg (50 mcg Intravenous Given 07/20/20 0422)  sulfamethoxazole-trimethoprim (BACTRIM DS) 800-160 MG per tablet 1 tablet (1 tablet Oral Given 07/20/20 0415)  Procedures Procedures  (including critical care time)  Medical Decision Making / ED Course I have reviewed the nursing notes for this encounter and the patient's prior records (if available in EHR or on provided paperwork).   Antonio Nash was evaluated in Emergency Department on 07/20/2020 for the symptoms described in the history of present illness. He was evaluated in the context of the global COVID-19 pandemic, which necessitated consideration that the patient might be at risk for infection with the SARS-CoV-2 virus that causes COVID-19. Institutional protocols  and algorithms that pertain to the evaluation of patients at risk for COVID-19 are in a state of rapid change based on information released by regulatory bodies including the CDC and federal and state organizations. These policies and algorithms were followed during the patient's care in the ED.  Patient presents with mid upper back pain after sneezing. Tenderness to palpation of the parascapular muscles on the left which appear to be spastic. This is most consistent with muscle strain/spasm. Low suspicion for intrathoracic processes.  No CVA tenderness the patient is currently being treated for urinary tract infection with Keflex. Patient spiked a fever here to 101.7. On review of records, patient's urine culture grew out E. coli resistant to first generation cephalosporins.  It is susceptible to all the generations of cephalosporins as well as fluoroquinolones and Bactrim.  Screening labs were obtained and are grossly reassuring without leukocytosis or anemia.  No significant electrolyte derangements.  Patient is not septic.  Given IV fluids.  Will change antibiotic to Bactrim.  Given first dose here.      Final Clinical Impression(s) / ED Diagnoses Final diagnoses:  Acute cystitis with hematuria  Muscle strain   The patient appears reasonably screened and/or stabilized for discharge and I doubt any other medical condition or other [facility] requiring further screening, evaluation, or treatment in the ED at this time prior to discharge. Safe for discharge with strict return precautions.  Disposition: Discharge  Condition: Good  I have discussed the results, Dx and Tx plan with the patient/family who expressed understanding and agree(s) with the plan. Discharge instructions discussed at length. The patient/family was given strict return precautions who verbalized understanding of the instructions. No further questions at time of discharge.    ED Discharge Orders         Ordered     sulfamethoxazole-trimethoprim (BACTRIM DS) 800-160 MG tablet  2 times daily        07/20/20 0608           Follow Up: Primary care provider  Call  To schedule an appointment for close follow up      This chart was dictated using voice recognition software.  Despite best efforts to proofread,  errors can occur which can change the documentation meaning.   Nira Conn, MD 07/20/20 [phone]

## 2020-07-20 NOTE — Discharge Instructions (Signed)
You may use over-the-counter Motrin (Ibuprofen), Acetaminophen (Tylenol), topical muscle creams such as SalonPas, Icy Hot, Bengay, etc. Please stretch, apply heat, and have massage therapy for additional assistance. ° °

## 2020-07-21 LAB — CULTURE, BLOOD (ROUTINE X 2)
Culture: NO GROWTH
Culture: NO GROWTH

## 2020-08-20 DIAGNOSIS — M546 Pain in thoracic spine: Secondary | ICD-10-CM | POA: Diagnosis not present

## 2020-08-28 DIAGNOSIS — M546 Pain in thoracic spine: Secondary | ICD-10-CM | POA: Diagnosis not present

## 2020-08-31 ENCOUNTER — Other Ambulatory Visit: Payer: Self-pay

## 2020-08-31 ENCOUNTER — Inpatient Hospital Stay (HOSPITAL_COMMUNITY)
Admission: EM | Admit: 2020-08-31 | Discharge: 2020-09-04 | DRG: 638 | Disposition: A | Discharge status: Home Health | Payer: No Typology Code available for payment source | Attending: Internal Medicine | Admitting: Internal Medicine

## 2020-08-31 ENCOUNTER — Encounter (HOSPITAL_COMMUNITY): Payer: Self-pay

## 2020-08-31 DIAGNOSIS — E1169 Type 2 diabetes mellitus with other specified complication: Principal | ICD-10-CM | POA: Diagnosis present

## 2020-08-31 DIAGNOSIS — D75839 Thrombocytosis, unspecified: Secondary | ICD-10-CM

## 2020-08-31 DIAGNOSIS — M869 Osteomyelitis, unspecified: Secondary | ICD-10-CM | POA: Diagnosis present

## 2020-08-31 DIAGNOSIS — Z885 Allergy status to narcotic agent status: Secondary | ICD-10-CM

## 2020-08-31 DIAGNOSIS — M4626 Osteomyelitis of vertebra, lumbar region: Secondary | ICD-10-CM | POA: Diagnosis not present

## 2020-08-31 DIAGNOSIS — E119 Type 2 diabetes mellitus without complications: Secondary | ICD-10-CM | POA: Diagnosis not present

## 2020-08-31 DIAGNOSIS — G061 Intraspinal abscess and granuloma: Secondary | ICD-10-CM | POA: Diagnosis not present

## 2020-08-31 DIAGNOSIS — Z20822 Contact with and (suspected) exposure to covid-19: Secondary | ICD-10-CM | POA: Diagnosis present

## 2020-08-31 DIAGNOSIS — Z79899 Other long term (current) drug therapy: Secondary | ICD-10-CM | POA: Diagnosis not present

## 2020-08-31 DIAGNOSIS — M4624 Osteomyelitis of vertebra, thoracic region: Secondary | ICD-10-CM | POA: Diagnosis not present

## 2020-08-31 DIAGNOSIS — Z0389 Encounter for observation for other suspected diseases and conditions ruled out: Secondary | ICD-10-CM | POA: Diagnosis not present

## 2020-08-31 DIAGNOSIS — M4644 Discitis, unspecified, thoracic region: Secondary | ICD-10-CM | POA: Diagnosis not present

## 2020-08-31 DIAGNOSIS — Z9889 Other specified postprocedural states: Secondary | ICD-10-CM

## 2020-08-31 DIAGNOSIS — M8608 Acute hematogenous osteomyelitis, other sites: Secondary | ICD-10-CM

## 2020-08-31 DIAGNOSIS — I10 Essential (primary) hypertension: Secondary | ICD-10-CM | POA: Diagnosis present

## 2020-08-31 LAB — CBC WITH DIFFERENTIAL/PLATELET
Abs Immature Granulocytes: 0.04 10*3/uL (ref 0.00–0.07)
Basophils Absolute: 0 10*3/uL (ref 0.0–0.1)
Basophils Relative: 0 %
Eosinophils Absolute: 0.1 10*3/uL (ref 0.0–0.5)
Eosinophils Relative: 1 %
HCT: 39.1 % (ref 39.0–52.0)
Hemoglobin: 11.9 g/dL — ABNORMAL LOW (ref 13.0–17.0)
Immature Granulocytes: 0 %
Lymphocytes Relative: 23 %
Lymphs Abs: 2.3 10*3/uL (ref 0.7–4.0)
MCH: 26.6 pg (ref 26.0–34.0)
MCHC: 30.4 g/dL (ref 30.0–36.0)
MCV: 87.5 fL (ref 80.0–100.0)
Monocytes Absolute: 1 10*3/uL (ref 0.1–1.0)
Monocytes Relative: 10 %
Neutro Abs: 6.6 10*3/uL (ref 1.7–7.7)
Neutrophils Relative %: 66 %
Platelets: 662 10*3/uL — ABNORMAL HIGH (ref 150–400)
RBC: 4.47 MIL/uL (ref 4.22–5.81)
RDW: 12.4 % (ref 11.5–15.5)
WBC: 10.1 10*3/uL (ref 4.0–10.5)
nRBC: 0 % (ref 0.0–0.2)

## 2020-08-31 LAB — COMPREHENSIVE METABOLIC PANEL
ALT: 20 U/L (ref 0–44)
AST: 17 U/L (ref 15–41)
Albumin: 3.3 g/dL — ABNORMAL LOW (ref 3.5–5.0)
Alkaline Phosphatase: 41 U/L (ref 38–126)
Anion gap: 11 (ref 5–15)
BUN: 13 mg/dL (ref 8–23)
CO2: 24 mmol/L (ref 22–32)
Calcium: 9.3 mg/dL (ref 8.9–10.3)
Chloride: 102 mmol/L (ref 98–111)
Creatinine, Ser: 1.27 mg/dL — ABNORMAL HIGH (ref 0.61–1.24)
GFR, Estimated: >60 mL/min (ref >60)
Glucose, Bld: 132 mg/dL — ABNORMAL HIGH (ref 70–99)
Potassium: 4.1 mmol/L (ref 3.5–5.1)
Sodium: 137 mmol/L (ref 135–145)
Total Bilirubin: 0.9 mg/dL (ref 0.3–1.2)
Total Protein: 8.8 g/dL — ABNORMAL HIGH (ref 6.5–8.1)

## 2020-08-31 LAB — SARS CORONAVIRUS 2 (TAT 6-24 HRS): SARS Coronavirus 2: NEGATIVE

## 2020-08-31 LAB — LACTIC ACID, PLASMA: Lactic Acid, Venous: 1.4 mmol/L (ref 0.5–1.9)

## 2020-08-31 LAB — PROTIME-INR
INR: 1.2 (ref 0.8–1.2)
Prothrombin Time: 14.6 seconds (ref 11.4–15.2)

## 2020-08-31 MED ORDER — MORPHINE SULFATE (PF) 4 MG/ML IV SOLN: 4.0000 mg | INTRAVENOUS | Status: DC | PRN

## 2020-08-31 MED ORDER — TRAMADOL HCL 50 MG PO TABS
50.0000 mg | ORAL_TABLET | Freq: Four times a day (QID) | ORAL | Status: DC | PRN
  Administered 2020-08-31 – 2020-09-04 (×10): 50 mg via ORAL
  Filled 2020-08-31 (×10): qty 1

## 2020-08-31 MED ORDER — MORPHINE SULFATE (PF) 2 MG/ML IV SOLN: 2.0000 mg | INTRAVENOUS | Status: DC | PRN

## 2020-08-31 MED ORDER — SENNOSIDES-DOCUSATE SODIUM 8.6-50 MG PO TABS: 1.0000 | ORAL_TABLET | Freq: Every evening | ORAL | Status: DC | PRN

## 2020-08-31 MED ORDER — ONDANSETRON HCL 4 MG PO TABS: 4.0000 mg | ORAL_TABLET | Freq: Four times a day (QID) | ORAL | Status: DC | PRN

## 2020-08-31 MED ORDER — ONDANSETRON HCL 4 MG/2ML IJ SOLN: 4.0000 mg | Freq: Four times a day (QID) | INTRAMUSCULAR | Status: DC | PRN

## 2020-08-31 MED ORDER — SODIUM CHLORIDE 0.9 % IV SOLN: Freq: Once | INTRAVENOUS | Status: AC

## 2020-08-31 MED ORDER — LISINOPRIL 20 MG PO TABS
40.0000 mg | ORAL_TABLET | Freq: Every day | ORAL | Status: DC
  Administered 2020-09-01 – 2020-09-04 (×4): 40 mg via ORAL
  Filled 2020-08-31 (×4): qty 2

## 2020-08-31 MED ORDER — CARVEDILOL 12.5 MG PO TABS
12.5000 mg | ORAL_TABLET | Freq: Two times a day (BID) | ORAL | Status: DC
  Administered 2020-08-31 – 2020-09-04 (×9): 12.5 mg via ORAL
  Filled 2020-08-31 (×10): qty 1

## 2020-08-31 NOTE — Plan of Care (Signed)

## 2020-08-31 NOTE — ED Provider Notes (Signed)
[facility] EMERGENCY DEPARTMENT Provider Note   CSN: [id number] Arrival date & time: 08/31/20  1155     History Chief Complaint  Patient presents with  . Back Pain  . Osteomyelitis     Antonio Nash is a 65 y.o. male.  HPI Was referred to the emergency department by Dr. Yevette Edwards.  Patient had MRI done that shows T5-T6 marrow edema with concern for osteomyelitis and discitis.  Patient referred for admission to medical service with plan for ID consult and biopsy.  Patient reports has been having a lot of problems with pain in his thoracic back.  It has been ongoing since mid December.  Earlier, he was diagnosed with pyelonephritis and treated with 2 courses of antibiotics.  He did have a urine culture that was positive for E. coli.  He reports last week he was having some low-grade fevers around 100 degrees.  Patient reports he is not having weakness of the legs.  However, if he stands he will start to get pain in tingling sensation from around his hips down the legs. He also reports that he is having difficulty having a bowel movement.  He denies abdominal pain.  He denies any recent cough or general body aches.  He reports pain medications that were prescribed were not very effective.  He is just trying Tylenol for some pain relief.    Past Medical History:  Diagnosis Date  . Diabetes mellitus without complication (HCC)   . Hypertension     There are no problems to display for this patient.   History reviewed. No pertinent surgical history.     No family history on file.  Social History   Tobacco Use  . Smoking status: Never Smoker  . Smokeless tobacco: Never Used  Substance Use Topics  . Alcohol use: Not Currently  . Drug use: Never    Home Medications Prior to Admission medications   Medication Sig Start Date End Date Taking? Authorizing Provider  carvedilol (COREG) 12.5 MG tablet Take 12.5 mg by mouth in the morning and at bedtime. 08/03/17    [provider]  lisinopril (ZESTRIL) 40 MG tablet Take 1 tablet by mouth daily. 08/03/17   [provider]    Allergies    Codeine  Review of Systems   Review of Systems 10 systems reviewed and negative except as per HPI Physical Exam Updated Vital Signs BP (!) 147/84 (BP Location: Right Arm)   Pulse 84   Resp 18   SpO2 100%   Physical Exam Constitutional:      Comments: Alert and nontoxic.  Mental status clear.  HENT:     Head: Normocephalic and atraumatic.  Eyes:     Conjunctiva/sclera: Conjunctivae normal.  Cardiovascular:     Rate and Rhythm: Normal rate and regular rhythm.  Pulmonary:     Effort: Pulmonary effort is normal.     Breath sounds: Normal breath sounds.  Abdominal:     General: There is no distension.     Palpations: Abdomen is soft.     Tenderness: There is no abdominal tenderness. There is no guarding.  Musculoskeletal:     Cervical back: Neck supple.     Comments: Patient experiences severe midthoracic back trying to sit for the forward flexion.  Also precipitated by rotational twisting.  Some reproducible element of pain in the mid thoracic spine to palpation.  No soft tissue abnormalities overlying.  No peripheral edema of the lower extremities.  Feet are  warm and dry.  Skin:    General: Skin is warm and dry.  Neurological:     General: No focal deficit present.     Mental Status: He is oriented to person, place, and time.     Coordination: Coordination normal.     ED Results / Procedures / Treatments   Labs (all labs ordered are listed, but only abnormal results are displayed) Labs Reviewed  COMPREHENSIVE METABOLIC PANEL - Abnormal; Notable for the following components:      Result Value   Glucose, Bld 132 (*)    Creatinine, Ser 1.27 (*)    Total Protein 8.8 (*)    Albumin 3.3 (*)    All other components within normal limits  CBC WITH DIFFERENTIAL/PLATELET - Abnormal; Notable for the following components:   Hemoglobin  11.9 (*)    Platelets 662 (*)    All other components within normal limits  CULTURE, BLOOD (ROUTINE X 2)  CULTURE, BLOOD (ROUTINE X 2)  SARS CORONAVIRUS 2 (TAT 6-24 HRS)  LACTIC ACID, PLASMA  PROTIME-INR    EKG None  Radiology No results found.  Procedures Procedures (including critical care time)  Medications Ordered in ED Medications  0.9 %  sodium chloride infusion (has no administration in time range)  morphine 4 MG/ML injection 4 mg (has no administration in time range)    ED Course  I have reviewed the triage vital signs and the nursing notes.  Pertinent labs & imaging results that were available during my care of the patient were reviewed by me and considered in my medical decision making (see chart for details).    MDM Rules/Calculators/A&P                          Consult: [facility] hospitalist for admission  Patient presents with plan for admission due to abnormalities identified on MRI concerning for osteomyelitis and discitis of the T5 T6 region.  Patient has been experiencing pain for a number of weeks.  He does describe some low-grade fevers.  He was diagnosed with a UTI early December.  He does not appear to have acute neurologic deficits.  Dr. Yevette Edwards of [facility] orthopedic and sports medicine has referred the patient.  Consultation placed to hospitalist for admission.  Orders placed for pain control and fluids. Final Clinical Impression(s) / ED Diagnoses Final diagnoses:  Osteomyelitis of other site, unspecified type [facility])    Rx / DC Orders ED Discharge Orders    None       Arby Barrette, MD 08/31/20 1641

## 2020-08-31 NOTE — ED Triage Notes (Signed)
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 Pt sent here for admission and ID consult. Pt ambulatory, back brace in place.

## 2020-08-31 NOTE — H&P (Addendum)
History and Physical    Antonio Nash YPP:[id number] DOB: 1956-05-04 DOA: 08/31/2020  PCP: Patient, No Pcp Per   Patient coming from: Home  I have personally briefly reviewed patient's old medical records in [facility]  Chief Complaint: Back pain  HPI: Antonio Nash is a 65 y.o. male with medical history significant of hypertension and diverticulitis type II: Probably diet controlled was referred to ED by Dr. Dumonski/orthopedics for MRI done as an outpatient which showed concern for osteomyelitis/discitis.  Patient states that he has been having worsening back pain for almost 6 weeks now, mostly in the lower back region, 6-7 out of 10 in intensity, worsening with movement, with no bowel or bladder incontinence and intermittent low-grade fevers.  Patient denies any weakness in his lower extremities but complains of pain and tingling sensation from around his hips down the legs.  Denies abdominal pain, diarrhea, dysuria, cough, shortness of breath, loss of consciousness, seizures.  He was recently treated for pyelonephritis and treated with 2 courses of antibiotics.  He had an MRI done as an outpatient few days ago and was subsequently seen at a Dr. Marshell Levan office today and patient was sent to ED for hospitalization, CT-guided biopsy and ID consult.  MRI was concerning for osteomyelitis and discitis of the T5-T6 region.  ED Course: He was afebrile with no leukocytosis.   Hospitalist service was called to evaluate the patient.  Review of Systems: As per HPI otherwise all other systems were reviewed and are negative.   Past Medical History:  Diagnosis Date  . Diabetes mellitus without complication (HCC)   . Hypertension     History reviewed. No pertinent surgical history.  Social history  reports that he has never smoked. He has never used smokeless tobacco. He reports previous alcohol use. He reports that he does not use drugs.  Allergies  Allergen Reactions  . Codeine      Other reaction(s): Other (See Comments) Skin crawls    No history of cancer or TB  Prior to Admission medications   Medication Sig Start Date End Date Taking? Authorizing Provider  carvedilol (COREG) 12.5 MG tablet Take 12.5 mg by mouth in the morning and at bedtime. 08/03/17  Yes [provider]  lisinopril (ZESTRIL) 40 MG tablet Take 1 tablet by mouth daily. 08/03/17  Yes [provider]    Physical Exam: Vitals:   08/31/20 1600 08/31/20 1615 08/31/20 1630 08/31/20 1645  BP: (!) 147/84 (!) 146/83 140/78 (!) 147/85  Pulse: 84 89 90 92  Resp: 18 18 17  (!) 22  SpO2: 100% 100% 100% 100%    Constitutional: NAD, calm, comfortable Vitals:   08/31/20 1600 08/31/20 1615 08/31/20 1630 08/31/20 1645  BP: (!) 147/84 (!) 146/83 140/78 (!) 147/85  Pulse: 84 89 90 92  Resp: 18 18 17  (!) 22  SpO2: 100% 100% 100% 100%   Eyes: PERRL, lids and conjunctivae normal ENMT: Mucous membranes are moist. Posterior pharynx clear of any exudate or lesions. Neck: normal, supple, no masses, no thyromegaly Respiratory: bilateral decreased breath sounds at bases, no wheezing, no crackles. Normal respiratory effort. No accessory muscle use.  Cardiovascular: S1 S2 positive, rate controlled. No extremity edema. 2+ pedal pulses.  Abdomen: no tenderness, no masses palpated. No hepatosplenomegaly. Bowel sounds positive.  Musculoskeletal: no clubbing / cyanosis. No joint deformity upper and lower extremities.  Skin: no rashes, lesions, ulcers. No induration Neurologic: CN 2-12 grossly intact. Moving extremities. No focal neurologic deficits.  Psychiatric: Normal  judgment and insight. Alert and oriented x 3. Normal mood.  Back: Tenderness over the mid thoracic/lower thoracic back with some paravertebral tenderness   Labs on Admission: I have personally reviewed following labs and imaging studies  CBC: Recent Labs  Lab 08/31/20 1208  WBC 10.1  NEUTROABS 6.6  HGB 11.9*  HCT 39.1  MCV  87.5  PLT 662*   Basic Metabolic Panel: Recent Labs  Lab 08/31/20 1208  NA 137  K 4.1  CL 102  CO2 24  GLUCOSE 132*  BUN 13  CREATININE 1.27*  CALCIUM 9.3   GFR: CrCl cannot be calculated (Unknown ideal weight.). Liver Function Tests: Recent Labs  Lab 08/31/20 1208  AST 17  ALT 20  ALKPHOS 41  BILITOT 0.9  PROT 8.8*  ALBUMIN 3.3*   No results for input(s): LIPASE, AMYLASE in the last 168 hours. No results for input(s): AMMONIA in the last 168 hours. Coagulation Profile: Recent Labs  Lab 08/31/20 1208  INR 1.2   Cardiac Enzymes: No results for input(s): CKTOTAL, CKMB, CKMBINDEX, TROPONINI in the last 168 hours. BNP (last 3 results) No results for input(s): PROBNP in the last 8760 hours. HbA1C: No results for input(s): HGBA1C in the last 72 hours. CBG: No results for input(s): GLUCAP in the last 168 hours. Lipid Profile: No results for input(s): CHOL, HDL, LDLCALC, TRIG, CHOLHDL, LDLDIRECT in the last 72 hours. Thyroid Function Tests: No results for input(s): TSH, T4TOTAL, FREET4, T3FREE, THYROIDAB in the last 72 hours. Anemia Panel: No results for input(s): VITAMINB12, FOLATE, FERRITIN, TIBC, IRON, RETICCTPCT in the last 72 hours. Urine analysis:    Component Value Date/Time   COLORURINE YELLOW 07/16/2020 0641   APPEARANCEUR CLOUDY (A) 07/16/2020 0641   LABSPEC >=1.030 07/19/2020 1943   PHURINE 5.0 07/19/2020 1943   GLUCOSEU NEGATIVE 07/19/2020 1943   HGBUR MODERATE (A) 07/19/2020 1943   BILIRUBINUR SMALL (A) 07/19/2020 1943   KETONESUR 80 (A) 07/19/2020 1943   PROTEINUR >=300 (A) 07/19/2020 1943   UROBILINOGEN 1.0 07/19/2020 1943   NITRITE NEGATIVE 07/19/2020 1943   LEUKOCYTESUR NEGATIVE 07/19/2020 1943    Radiological Exams on Admission: No results found.  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 Marshell Levan office and sent for admission and CT-guided biopsy and ID consult -I have spoken to Dr. Almyra Free: patient will possibly have IR guided biopsy in a.m.  Keep n.p.o. past midnight. -I have communicated with infectious disease/Dr. Luciana Axe regarding the patient.  Patient is currently afebrile with no leukocytosis: will hold off on starting antibiotics at this time -Spoke to Dr. Yevette Edwards on phone: He mentioned no need for IV steroids at this time since patient is not having neurological symptoms of weakness of lower extremities.  He is expecting that the MRI findings of phlegmon/edema will improve with antibiotic treatment and patient does not need any surgical intervention at this time.    Hypertension -Resume home regimen of Coreg and lisinopril. -Monitor blood pressure  Diabetes mellitus type 2, probably  diet controlled -A1c in AM.  CBGs with SSI  Thrombocytosis -Possibly reactive.  Monitor.   DVT prophylaxis: SCDs.  Will not start Lovenox till CT-guided biopsy Code Status: Full Family Communication: None at bedside Disposition Plan: To be decided at this time Consults called: IR/ID.  Spoke to Dr. Yevette Edwards on phone Admission status: Inpatient/MedSurg  Severity of Illness: The appropriate patient status for this patient is INPATIENT. Inpatient status is judged to be reasonable and necessary in order to provide the required intensity of service to ensure the patient's safety. The patient's  presenting symptoms, physical exam findings, and initial radiographic and laboratory data in the context of their chronic comorbidities is felt to place them at high risk for further clinical deterioration. Furthermore, it is not anticipated that the patient will be medically stable for discharge from the hospital within 2 midnights of admission. The following factors support the patient status of inpatient.   " The patient's presenting symptoms include worsening back pain. " The worrisome physical exam findings include back tenderness. " The initial radiographic and laboratory data are worrisome because of discitis/osteomyelitis of T5-6. " The chronic co-morbidities include diabetes mellitus type 2, hypertension.   * I certify that at the point of admission it is my clinical judgment that the patient will require inpatient hospital care spanning beyond 2 midnights from the point of admission due to high intensity of service, high risk for further deterioration and high frequency of surveillance required.Glade Lloyd MD [facility]  08/31/2020, 5:07 PM

## 2020-08-31 NOTE — Plan of Care (Signed)

## 2020-09-01 ENCOUNTER — Inpatient Hospital Stay: Payer: Self-pay

## 2020-09-01 ENCOUNTER — Inpatient Hospital Stay (HOSPITAL_COMMUNITY): Payer: No Typology Code available for payment source

## 2020-09-01 DIAGNOSIS — M869 Osteomyelitis, unspecified: Secondary | ICD-10-CM | POA: Diagnosis not present

## 2020-09-01 DIAGNOSIS — I10 Essential (primary) hypertension: Secondary | ICD-10-CM | POA: Diagnosis not present

## 2020-09-01 DIAGNOSIS — M4626 Osteomyelitis of vertebra, lumbar region: Secondary | ICD-10-CM

## 2020-09-01 DIAGNOSIS — E119 Type 2 diabetes mellitus without complications: Secondary | ICD-10-CM | POA: Diagnosis not present

## 2020-09-01 LAB — CBC WITH DIFFERENTIAL/PLATELET
Abs Immature Granulocytes: 0.04 10*3/uL (ref 0.00–0.07)
Basophils Absolute: 0 10*3/uL (ref 0.0–0.1)
Basophils Relative: 0 %
Eosinophils Absolute: 0.1 10*3/uL (ref 0.0–0.5)
Eosinophils Relative: 2 %
HCT: 32.8 % — ABNORMAL LOW (ref 39.0–52.0)
Hemoglobin: 10.2 g/dL — ABNORMAL LOW (ref 13.0–17.0)
Immature Granulocytes: 1 %
Lymphocytes Relative: 34 %
Lymphs Abs: 2.6 10*3/uL (ref 0.7–4.0)
MCH: 26.8 pg (ref 26.0–34.0)
MCHC: 31.1 g/dL (ref 30.0–36.0)
MCV: 86.1 fL (ref 80.0–100.0)
Monocytes Absolute: 1 10*3/uL (ref 0.1–1.0)
Monocytes Relative: 12 %
Neutro Abs: 4 10*3/uL (ref 1.7–7.7)
Neutrophils Relative %: 51 %
Platelets: 580 10*3/uL — ABNORMAL HIGH (ref 150–400)
RBC: 3.81 MIL/uL — ABNORMAL LOW (ref 4.22–5.81)
RDW: 12.6 % (ref 11.5–15.5)
WBC: 7.8 10*3/uL (ref 4.0–10.5)
nRBC: 0 % (ref 0.0–0.2)

## 2020-09-01 LAB — C-REACTIVE PROTEIN: CRP: 8 mg/dL — ABNORMAL HIGH (ref <1.0)

## 2020-09-01 LAB — BASIC METABOLIC PANEL
Anion gap: 10 (ref 5–15)
BUN: 15 mg/dL (ref 8–23)
CO2: 22 mmol/L (ref 22–32)
Calcium: 8.5 mg/dL — ABNORMAL LOW (ref 8.9–10.3)
Chloride: 104 mmol/L (ref 98–111)
Creatinine, Ser: 0.99 mg/dL (ref 0.61–1.24)
GFR, Estimated: >60 mL/min (ref >60)
Glucose, Bld: 112 mg/dL — ABNORMAL HIGH (ref 70–99)
Potassium: 4 mmol/L (ref 3.5–5.1)
Sodium: 136 mmol/L (ref 135–145)

## 2020-09-01 LAB — GLUCOSE, CAPILLARY
Glucose-Capillary: 138 mg/dL — ABNORMAL HIGH (ref 70–99)
Glucose-Capillary: 135 mg/dL — ABNORMAL HIGH (ref 70–99)

## 2020-09-01 LAB — HEMOGLOBIN A1C
Hgb A1c MFr Bld: 7.4 % — ABNORMAL HIGH (ref 4.8–5.6)
Mean Plasma Glucose: 165.68 mg/dL

## 2020-09-01 LAB — MAGNESIUM: Magnesium: 2.1 mg/dL (ref 1.7–2.4)

## 2020-09-01 LAB — HIV ANTIBODY (ROUTINE TESTING W REFLEX): HIV Screen 4th Generation wRfx: NONREACTIVE

## 2020-09-01 MED ORDER — FENTANYL CITRATE (PF) 100 MCG/2ML IJ SOLN
INTRAMUSCULAR | Status: AC | PRN
  Administered 2020-09-01: 50 ug via INTRAVENOUS

## 2020-09-01 MED ORDER — MIDAZOLAM HCL 2 MG/2ML IJ SOLN
INTRAMUSCULAR | Status: AC
  Filled 2020-09-01: qty 2

## 2020-09-01 MED ORDER — INSULIN ASPART 100 UNIT/ML ~~LOC~~ SOLN: 0.0000 [IU] | Freq: Every day | SUBCUTANEOUS | Status: DC

## 2020-09-01 MED ORDER — LIDOCAINE HCL 1 % IJ SOLN
INTRAMUSCULAR | Status: AC
  Filled 2020-09-01: qty 20

## 2020-09-01 MED ORDER — INSULIN ASPART 100 UNIT/ML ~~LOC~~ SOLN
0.0000 [IU] | Freq: Three times a day (TID) | SUBCUTANEOUS | Status: DC
  Administered 2020-09-01: 2 [IU] via SUBCUTANEOUS
  Administered 2020-09-02: 3 [IU] via SUBCUTANEOUS
  Administered 2020-09-03: 2 [IU] via SUBCUTANEOUS

## 2020-09-01 MED ORDER — VANCOMYCIN HCL 2000 MG/400ML IV SOLN
2000.0000 mg | Freq: Once | INTRAVENOUS | Status: AC
  Administered 2020-09-01: 2000 mg via INTRAVENOUS
  Filled 2020-09-01: qty 400

## 2020-09-01 MED ORDER — SODIUM CHLORIDE 0.9 % IV SOLN
2.0000 g | INTRAVENOUS | Status: DC
  Administered 2020-09-01 – 2020-09-04 (×4): 2 g via INTRAVENOUS
  Filled 2020-09-01 (×4): qty 20

## 2020-09-01 MED ORDER — MIDAZOLAM HCL 2 MG/2ML IJ SOLN
INTRAMUSCULAR | Status: AC | PRN
  Administered 2020-09-01: 1 mg via INTRAVENOUS

## 2020-09-01 MED ORDER — FENTANYL CITRATE (PF) 100 MCG/2ML IJ SOLN
INTRAMUSCULAR | Status: AC
  Filled 2020-09-01: qty 2

## 2020-09-01 MED ORDER — VANCOMYCIN HCL 1750 MG/350ML IV SOLN
1750.0000 mg | INTRAVENOUS | Status: DC
  Filled 2020-09-01: qty 350

## 2020-09-01 NOTE — Progress Notes (Signed)
Patient ID: Antonio Nash, male   DOB: 01/03/1956, 65 y.o.   MRN: [id number]  PROGRESS NOTE    Antonio Nash  TIW:[id number] DOB: 1955-08-27 DOA: 08/31/2020 PCP: Patient, No Pcp Per   Brief Narrative:  65 y.o. male with medical history significant of hypertension and diabetes mellitus type II: Probably diet controlled was referred to ED on 08/30/2020 by Dr. Dumonski/orthopedics for MRI done as an outpatient which showed concern for osteomyelitis/discitis of T5-T6.  ID and IR were consulted.  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 Marshell Levan office and sent for admission and CT-guided biopsy and ID consult -Spoke to Dr. Yevette Edwards on phone on 08/31/2020: He mentioned no need for IV steroids at this time since patient is not having neurological symptoms of weakness of lower extremities.  He is expecting that the MRI findings of phlegmon/edema will improve with antibiotic treatment and patient does not need any surgical intervention at this time.   -For possible CT-guided biopsy by IR today.  Keep n.p.o. for today.  Hold off on antibiotics.  Follow ID recommendations. -Afebrile since admission.  No leukocytosis.  CRP today  Hypertension -Resume home regimen of Coreg and lisinopril. -Monitor blood pressure  Diabetes mellitus type 2, probably diet controlled -A1c pending. CBGs with SSI  Thrombocytosis -Possibly reactive.  Monitor.  DVT prophylaxis: SCDs.  Will not start Lovenox till CT-guided biopsy Code Status: Full Family Communication: None at bedside Disposition Plan: Status is: Inpatient  Remains inpatient appropriate because:Inpatient level of care appropriate due to severity of illness   Dispo: The patient is from: Home              Anticipated d/c is to: Home              Anticipated  d/c date is: To be decided              Patient currently is not medically stable to d/c.  Consultants: IR/ID.  Spoke to Dr. Yevette Edwards on phone  Procedures: None  Antimicrobials: None   Subjective: Patient seen and examined at bedside.  Denies overnight fever, vomiting, worsening shortness of breath.  No weakness in his lower extremities.  Complains of back pain.  Objective: Vitals:   08/31/20 1820 08/31/20 2028 09/01/20 0404 09/01/20 0405  BP: (!) 148/82 119/71 122/72   Pulse: 95 88 79   Resp: 17 18 18    Temp: 99.7 F (37.6 C) 99.5 F (37.5 C)  97.6 F (36.4 C)  TempSrc: Oral Oral  Oral  SpO2: 93% 100% 100%     Intake/Output Summary (Last 24 hours) at 09/01/2020 0817 Last data filed at 09/01/2020 0350 Gross per 24 hour  Intake 245.83 ml  Output 400 ml  Net -154.17 ml   There were no vitals filed for this visit.  Examination:  General exam: No acute distress.   Respiratory system: Bilateral decreased breath sounds at bases Cardiovascular system: S1 & S2 heard, Rate controlled Gastrointestinal system: Abdomen is nondistended, soft and nontender. Normal bowel sounds heard. Extremities: No cyanosis, clubbing; trace lower extremity edema Central nervous system: Alert and oriented. No focal neurological deficits. Moving extremities Skin: No rashes, lesions or ulcers Psychiatry: Judgement and insight appear normal. Mood & affect appropriate.     Data Reviewed: I have personally reviewed following labs and imaging studies  CBC: Recent Labs  Lab 08/31/20 1208 09/01/20 0214  WBC 10.1 7.8  NEUTROABS 6.6 4.0  HGB 11.9* 10.2*  HCT 39.1 32.8*  MCV 87.5 86.1  PLT 662* 580*   Basic Metabolic Panel: Recent Labs  Lab 08/31/20 1208 09/01/20 0214  NA 137 136  K 4.1 4.0  CL 102 104  CO2 24 22  GLUCOSE 132* 112*  BUN 13 15  CREATININE 1.27* 0.99  CALCIUM 9.3 8.5*  MG  --  2.1   GFR: CrCl cannot be calculated (Unknown ideal weight.). Liver Function  Tests: Recent Labs  Lab 08/31/20 1208  AST 17  ALT 20  ALKPHOS 41  BILITOT 0.9  PROT 8.8*  ALBUMIN 3.3*   No results for input(s): LIPASE, AMYLASE in the last 168 hours. No results for input(s): AMMONIA in the last 168 hours. Coagulation Profile: Recent Labs  Lab 08/31/20 1208  INR 1.2   Cardiac Enzymes: No results for input(s): CKTOTAL, CKMB, CKMBINDEX, TROPONINI in the last 168 hours. BNP (last 3 results) No results for input(s): PROBNP in the last 8760 hours. HbA1C: No results for input(s): HGBA1C in the last 72 hours. CBG: No results for input(s): GLUCAP in the last 168 hours. Lipid Profile: No results for input(s): CHOL, HDL, LDLCALC, TRIG, CHOLHDL, LDLDIRECT in the last 72 hours. Thyroid Function Tests: No results for input(s): TSH, T4TOTAL, FREET4, T3FREE, THYROIDAB in the last 72 hours. Anemia Panel: No results for input(s): VITAMINB12, FOLATE, FERRITIN, TIBC, IRON, RETICCTPCT in the last 72 hours. Sepsis Labs: Recent Labs  Lab 08/31/20 1215  LATICACIDVEN 1.4    Recent Results (from the past 240 hour(s))  SARS CORONAVIRUS 2 (TAT 6-24 HRS) Nasopharyngeal Nasopharyngeal Swab     Status: None   Collection Time: 08/31/20  4:10 PM   Specimen: Nasopharyngeal Swab  Result Value Ref Range Status   SARS Coronavirus 2 NEGATIVE NEGATIVE Final    Comment: (NOTE) SARS-CoV-2 target nucleic acids are NOT DETECTED.  The SARS-CoV-2 RNA is generally detectable in upper and lower respiratory specimens during the acute phase of infection. Negative results do not preclude SARS-CoV-2 infection, do not rule out co-infections with other pathogens, and should not be used as the sole basis for treatment or other patient management decisions. Negative results must be combined with clinical observations, patient history, and epidemiological information. The expected result is Negative.  Fact Sheet for Patients: HairSlick.no  Fact Sheet for  Healthcare Providers: quierodirigir.com  This test is not yet approved or cleared by the [place] FDA and  has been authorized for detection and/or diagnosis of SARS-CoV-2 by FDA under an Emergency Use Authorization (EUA). This EUA will remain  in effect (meaning this test can be used) for the duration of the COVID-19 declaration under Se ction 564(b)(1) of the Act, 21 U.S.C. section 360bbb-3(b)(1), unless the authorization is terminated or revoked sooner.  Performed at [facility], [address] [place], [place] [address]          Radiology Studies: No results found.      Scheduled Meds: . carvedilol  12.5 mg Oral BID WC  . lisinopril  40 mg Oral Daily   Continuous Infusions:        Glade Lloyd, MD [facility] 09/01/2020, 8:17 AM

## 2020-09-01 NOTE — Procedures (Signed)
Interventional Radiology Procedure:   Indications: Concern for discitis / osteomyelitis at T5-T6.   Procedure: CT aspiration of right paraspinal fluid / material in thoracic spine  Findings: Needle directed in right paraspinal soft tissue thickening / inflammation near T5-T6 level.  1 ml of bloody fluid aspirated.  Complications: None     EBL: Minimal, less than 5 ml  Plan: Send fluid for culture.  Will get CXR in one hour because needle directed near pleural space.     Princesa Willig R. Lowella Dandy, MD  Pager: [phone]

## 2020-09-01 NOTE — Progress Notes (Signed)
Pharmacy Antibiotic Note  Antonio Nash is a 65 y.o. male admitted on 08/31/2020 with osteomyelitis/discitis .  Pharmacy has been consulted for vancomycin dosing.   Plan: - Vancomycin 2000 mg IV once, followed by 1750 mg IV every 24 hours (estimated AUC 450, goal 400-500) - Continue ceftriaxone 2 g IV every 24 hours - Monitor clinical status and renal function - Follow-up cultures to de-escalate     Temp (24hrs), Avg:99.2 F (37.3 C), Min:97.6 F (36.4 C), Max:100 F (37.8 C)  Recent Labs  Lab 08/31/20 1208 08/31/20 1215 09/01/20 0214  WBC 10.1  --  7.8  CREATININE 1.27*  --  0.99  LATICACIDVEN  --  1.4  --     CrCl cannot be calculated (Unknown ideal weight.).    Allergies  Allergen Reactions  . Codeine     Other reaction(s): Other (See Comments) Skin crawls    Antimicrobials this admission: 1/15 CTX >> 1/15 Vancomycin >>  Dose adjustments this admission:   Microbiology results: 1/14 COVID: negative 1/14 BCx x2: NG <24 hours, pending 1/15 Abscess cx: sent      Zareah Hunzeker L. Arlester Marker, PharmD, MBA [facility] PGY2 Pharmacy Resident Weekends 7:00 am - 3:00 pm, please call [phone] 09/01/20      2:25 PM  Please check AMION for all [facility] phone numbers After 10:00 PM, call the Main Pharmacy [phone]

## 2020-09-01 NOTE — Progress Notes (Signed)
Chief Complaint: Patient was seen in consultation today for  Chief Complaint  Patient presents with  . Back Pain  . Osteomyelitis     Referring Physician(s): Dr. Hanley Ben  Supervising Physician: Richarda Overlie  Patient Status: [facility] - In-pt  History of Present Illness: Antonio Nash is a 65 y.o. male with several week hx of progressive back. Was seen by Dr. Yevette Edwards as outpt and had MRI done. This suggested discitis/osteomyelitis at the T5-T6 level canal stenosis and cord compression. He was admitted for further workup. IR is asked to perform disc aspiration for sampling. PMHx, meds, labs, imaging, allergies reviewed. Has been NPO this am    Past Medical History:  Diagnosis Date  . Diabetes mellitus without complication (HCC)   . Hypertension     History reviewed. No pertinent surgical history.  Allergies: Codeine  Medications:  Current Facility-Administered Medications:  .  carvedilol (COREG) tablet 12.5 mg, 12.5 mg, Oral, BID WC, Alekh, Kshitiz, MD, 12.5 mg at 09/01/20 1019 .  insulin aspart (novoLOG) injection 0-15 Units, 0-15 Units, Subcutaneous, TID WC, Alekh, Kshitiz, MD .  insulin aspart (novoLOG) injection 0-5 Units, 0-5 Units, Subcutaneous, QHS, Alekh, Kshitiz, MD .  lisinopril (ZESTRIL) tablet 40 mg, 40 mg, Oral, Daily, Alekh, Kshitiz, MD, 40 mg at 09/01/20 1018 .  morphine 2 MG/ML injection 2 mg, 2 mg, Intravenous, Q2H PRN, Alekh, Kshitiz, MD .  ondansetron (ZOFRAN) tablet 4 mg, 4 mg, Oral, Q6H PRN **OR** ondansetron (ZOFRAN) injection 4 mg, 4 mg, Intravenous, Q6H PRN, Alekh, Kshitiz, MD .  senna-docusate (Senokot-S) tablet 1 tablet, 1 tablet, Oral, QHS PRN, Hanley Ben, Kshitiz, MD .  traMADol (ULTRAM) tablet 50 mg, 50 mg, Oral, Q6H PRN, Hanley Ben, Kshitiz, MD, 50 mg at 09/01/20 1037    No family history on file.  Social History   Socioeconomic History  . Marital status: Married    Spouse name: Not on file  . Number of children: Not on file  . Years of  education: Not on file  . Highest education level: Not on file  Occupational History  . Not on file  Tobacco Use  . Smoking status: Never Smoker  . Smokeless tobacco: Never Used  Substance and Sexual Activity  . Alcohol use: Not Currently  . Drug use: Never  . Sexual activity: Not on file  Other Topics Concern  . Not on file  Social History Narrative  . Not on file   Social Determinants of Health   Financial Resource Strain: Not on file  Food Insecurity: Not on file  Transportation Needs: Not on file  Physical Activity: Not on file  Stress: Not on file  Social Connections: Not on file    Review of Systems: A 12 point ROS discussed and pertinent positives are indicated in the HPI above.  All other systems are negative.  Review of Systems  Vital Signs: BP 115/76 (BP Location: Right Arm)   Pulse 79   Temp 99.1 F (37.3 C) (Oral)   Resp 18   SpO2 100%   Physical Exam Constitutional:      Appearance: Normal appearance. He is not ill-appearing or toxic-appearing.  HENT:     Mouth/Throat:     Mouth: Mucous membranes are moist.     Pharynx: Oropharynx is clear.  Cardiovascular:     Rate and Rhythm: Normal rate and regular rhythm.     Heart sounds: Normal heart sounds.  Pulmonary:     Effort: Pulmonary effort is normal. No respiratory distress.  Breath sounds: Normal breath sounds.  Skin:    General: Skin is warm and dry.  Neurological:     General: No focal deficit present.     Mental Status: He is alert and oriented to person, place, and time.  Psychiatric:        Mood and Affect: Mood normal.        Thought Content: Thought content normal.        Judgment: Judgment normal.     Imaging: No results found.  Labs:  CBC: Recent Labs    07/16/20 0603 07/20/20 0315 08/31/20 1208 09/01/20 0214  WBC 8.9 9.2 10.1 7.8  HGB 14.0 13.4 11.9* 10.2*  HCT 44.4 41.1 39.1 32.8*  PLT 171 162 662* 580*    COAGS: Recent Labs    08/31/20 1208  INR 1.2     BMP: Recent Labs    07/16/20 0603 07/20/20 0315 08/31/20 1208 09/01/20 0214  NA 136 133* 137 136  K 3.4* 3.6 4.1 4.0  CL 101 100 102 104  CO2 22 22 24 22   GLUCOSE 153* 177* 132* 112*  BUN 21 19 13 15   CALCIUM 8.4* 8.6* 9.3 8.5*  CREATININE 1.32* 1.03 1.27* 0.99  GFRNONAA 59* >60 >60 >60    LIVER FUNCTION TESTS: Recent Labs    07/16/20 0603 07/20/20 0315 08/31/20 1208  BILITOT 1.1 1.2 0.9  AST 21 18 17   ALT 31 30 20   ALKPHOS 42 45 41  PROT 6.5 7.0 8.8*  ALBUMIN 3.5 3.3* 3.3*    TUMOR MARKERS: No results for input(s): AFPTM, CEA, CA199, CHROMGRNA in the last 8760 hours.  Assessment and Plan: T5-T6 discitis/osteomyelitis Plan for CT guided aspiration/biopsy Labs reviewed. Risks and benefits of disc aspiration was discussed with the patient and/or patient's family including, but not limited to bleeding, infection, damage to adjacent structures or low yield requiring additional tests.  All of the questions were answered and there is agreement to proceed.  Consent signed and in chart.    Thank you for this interesting consult.  I greatly enjoyed meeting Antonio Nash and look forward to participating in their care.  A copy of this report was sent to the requesting provider on this date.  Electronically Signed: 09/02/20, PA-C 09/01/2020, 10:42 AM   I spent a total of 20 minutes in face to face in clinical consultation, greater than 50% of which was counseling/coordinating care for disc aspiration

## 2020-09-01 NOTE — Consult Note (Addendum)
[facility]       Reason for Consult: discitis/osteomyelitis   Referring Physician: Dr. Hanley Ben  Principal Problem:   Osteomyelitis [facility]) Active Problems:   HTN (hypertension)   Type 2 diabetes mellitus without complication (HCC)   . carvedilol  12.5 mg Oral BID WC  . fentaNYL      . insulin aspart  0-15 Units Subcutaneous TID WC  . insulin aspart  0-5 Units Subcutaneous QHS  . lidocaine      . lisinopril  40 mg Oral Daily  . midazolam        Recommendations: picc line Vancomycin and ceftriaxone  Assessment: He has osteomyelitis/discitis of the T5-6 region on MRI done by Dr. Yevette Edwards (report not available to me).  Now s/p disc aspiration so will start empiric antibiotics and order picc line.  Ok for discharge from ID standpoint once that is done, I can make changes after discharge as needed.  Blood cultures were sent yesterday so will wait until tomorrow for the picc line to be sure there is no early growth on the blood cultures.  If negative cultures tomorrow am, ok to place the picc line then.    Antibiotics: none  HPI: Antonio Nash is a 65 y.o. male with a history of DM, HTN who has had mid back pain since about November 29th and has progressively worsened.  In December he was treated for a UTI due to the back pain though was having no dysuria or frequency.  He was given Keflex for a UTI then Bactrim but continued to have mid back pain and fever.  He recent then went to Dr. Yevette Edwards and MRI done, c/w infection as above.  Seen by IR today and s/p aspiration.  He was also given antibiotics in September at the [facility] after blood work.  He was told his blood work determined he had an infection.     Review of Systems:  Constitutional: negative for malaise and anorexia Gastrointestinal: negative for diarrhea Integument/breast: negative for rash All other systems reviewed and are negative    Past Medical History:  Diagnosis Date  . Diabetes  mellitus without complication (HCC)   . Hypertension     Social History   Tobacco Use  . Smoking status: Never Smoker  . Smokeless tobacco: Never Used  Substance Use Topics  . Alcohol use: Not Currently  . Drug use: Never    No family history on file.  Allergies  Allergen Reactions  . Codeine     Other reaction(s): Other (See Comments) Skin crawls    Physical Exam: Constitutional: in no apparent distress  Vitals:   09/01/20 1255 09/01/20 1320  BP: 116/73 124/78  Pulse: 79 76  Resp: 14 14  Temp:    SpO2: 100% 98%   EYES: anicteric Cardiovascular: Cor RRR Respiratory: clear; Musculoskeletal: no edema Skin: negatives: no rash Neuro: non-focal  Lab Results  Component Value Date   WBC 7.8 09/01/2020   HGB 10.2 (L) 09/01/2020   HCT 32.8 (L) 09/01/2020   MCV 86.1 09/01/2020   PLT 580 (H) 09/01/2020    Lab Results  Component Value Date   CREATININE 0.99 09/01/2020   BUN 15 09/01/2020   NA 136 09/01/2020   K 4.0 09/01/2020   CL 104 09/01/2020   CO2 22 09/01/2020    Lab Results  Component Value Date   ALT 20 08/31/2020   AST 17 08/31/2020   ALKPHOS 41 08/31/2020  Microbiology: Recent Results (from the past 240 hour(s))  Culture, blood (Routine x 2)     Status: None (Preliminary result)   Collection Time: 08/31/20 12:15 PM   Specimen: Site Not Specified; Blood  Result Value Ref Range Status   Specimen Description SITE NOT SPECIFIED  Final   Special Requests   Final    BOTTLES DRAWN AEROBIC AND ANAEROBIC Blood Culture adequate volume   Culture   Final    NO GROWTH < 24 HOURS Performed at [facility], [address], [place], [place] [address]    Report Status PENDING  Incomplete  Culture, blood (Routine x 2)     Status: None (Preliminary result)   Collection Time: 08/31/20  3:29 PM   Specimen: BLOOD  Result Value Ref Range Status   Specimen Description BLOOD RIGHT ANTECUBITAL  Final   Special Requests   Final    BOTTLES DRAWN AEROBIC  AND ANAEROBIC Blood Culture adequate volume   Culture   Final    NO GROWTH < 24 HOURS Performed at [facility], [address] [place], [place] [address]    Report Status PENDING  Incomplete  SARS CORONAVIRUS 2 (TAT 6-24 HRS) Nasopharyngeal Nasopharyngeal Swab     Status: None   Collection Time: 08/31/20  4:10 PM   Specimen: Nasopharyngeal Swab  Result Value Ref Range Status   SARS Coronavirus 2 NEGATIVE NEGATIVE Final    Comment: (NOTE) SARS-CoV-2 target nucleic acids are NOT DETECTED.  The SARS-CoV-2 RNA is generally detectable in upper and lower respiratory specimens during the acute phase of infection. Negative results do not preclude SARS-CoV-2 infection, do not rule out co-infections with other pathogens, and should not be used as the sole basis for treatment or other patient management decisions. Negative results must be combined with clinical observations, patient history, and epidemiological information. The expected result is Negative.  Fact Sheet for Patients: HairSlick.no  Fact Sheet for Healthcare Providers: quierodirigir.com  This test is not yet approved or cleared by the [place] FDA and  has been authorized for detection and/or diagnosis of SARS-CoV-2 by FDA under an Emergency Use Authorization (EUA). This EUA will remain  in effect (meaning this test can be used) for the duration of the COVID-19 declaration under Se ction 564(b)(1) of the Act, 21 U.S.C. section 360bbb-3(b)(1), unless the authorization is terminated or revoked sooner.  Performed at [facility], [address], [place], [place] [address]     Gardiner Barefoot, MD [facility] [email or web address] 09/01/2020, 1:52 PM

## 2020-09-01 NOTE — Evaluation (Signed)
Physical Therapy Evaluation Patient Details Name: Antonio Nash MRN: [id number] DOB: [birth year] Today's Date: 09/01/2020   History of Present Illness  Patient is a 65 y/o male with PMH significant for DM, HTN, and diverticulitis. Patient found to have osteomyelitis/discitis of T5-6 area.  Clinical Impression  PTA, patient lives with wife and independent with mobility. Patient presents with impaired balance and generalized weakness. Patient requires min guard for ambulation with no AD due to balance deficits noted. Patient will benefit from skilled PT services to address listed deficits and improve dynamic standing balance. Recommend OPPT following discharge to improve mobility and balance to maximize independence.     Follow Up Recommendations Outpatient PT    Equipment Recommendations  None recommended by PT    Recommendations for Other Services       Precautions / Restrictions Precautions Precautions: Fall;Back Precaution Booklet Issued: No Precaution Comments: discussed precautions to prevent strain on back, patient receptive Required Braces or Orthoses: Other Brace Other Brace: Patient owns lumbar corset brace, however wears in around chest area near T5-6 ("where my pain is") Restrictions Weight Bearing Restrictions: No      Mobility  Bed Mobility Overal bed mobility: Needs Assistance Bed Mobility: Rolling;Sidelying to Sit;Sit to Sidelying Rolling: Supervision Sidelying to sit: Supervision     Sit to sidelying: Supervision General bed mobility comments: cues for log roll technique    Transfers Overall transfer level: Needs assistance Equipment used: None Transfers: Sit to/from Stand Sit to Stand: Supervision            Ambulation/Gait Ambulation/Gait assistance: Min guard Gait Distance (Feet): 250 Feet Assistive device: None Gait Pattern/deviations: Step-through pattern;Decreased stride length;Drifts right/left     General Gait Details: Drifts L/R  with mild scissoring. Patient demos unsteadiness with ambulation requiring min guard for safety.  Stairs Stairs: Yes Stairs assistance: Min guard Stair Management: No rails;Alternating pattern;Forwards (step to with descent) Number of Stairs: 3 General stair comments: min guard for safety  Wheelchair Mobility    Modified Rankin (Stroke Patients Only)       Balance Overall balance assessment: Needs assistance Sitting-balance support: No upper extremity supported;Feet supported Sitting balance-Leahy Scale: Good     Standing balance support: No upper extremity supported;During functional activity Standing balance-Leahy Scale: Poor Standing balance comment: unsteadiness noted with ambulation and stair negotiation, min guard for safety                             Pertinent Vitals/Pain Pain Assessment: 0-10 Pain Score: 5  Pain Location: back Pain Descriptors / Indicators: Grimacing Pain Intervention(s): Monitored during session    Home Living Family/patient expects to be discharged to:: Private residence Living Arrangements: Spouse/significant other Available Help at Discharge: Family;Available PRN/intermittently Type of Home: House Home Access: Stairs to enter Entrance Stairs-Rails: None Entrance Stairs-Number of Steps: 3 Home Layout: One level Home Equipment: Cane - single point      Prior Function Level of Independence: Independent         Comments: works as a Actor Extremity Assessment Upper Extremity Assessment: Overall WFL for tasks assessed    Lower Extremity Assessment Lower Extremity Assessment: Generalized weakness       Communication   Communication: No difficulties  Cognition Arousal/Alertness: Awake/alert Behavior During Therapy: WFL for tasks assessed/performed Overall Cognitive Status: Within Functional Limits for tasks assessed  General Comments      Exercises     Assessment/Plan    PT Assessment Patient needs continued PT services  PT Problem List Decreased strength;Decreased activity tolerance;Decreased balance;Decreased mobility       PT Treatment Interventions DME instruction;Gait training;Stair training;Functional mobility training;Therapeutic activities;Therapeutic exercise;Balance training;Patient/family education    PT Goals (Current goals can be found in the Care Plan section)  Acute Rehab PT Goals Patient Stated Goal: to reduce pain PT Goal Formulation: With patient Time For Goal Achievement: 09/15/20 Potential to Achieve Goals: Good    Frequency Min 3X/week   Barriers to discharge        Co-evaluation               AM-PAC PT "6 Clicks" Mobility  Outcome Measure Help needed turning from your back to your side while in a flat bed without using bedrails?: A Little Help needed moving from lying on your back to sitting on the side of a flat bed without using bedrails?: A Little Help needed moving to and from a bed to a chair (including a wheelchair)?: A Little Help needed standing up from a chair using your arms (e.g., wheelchair or bedside chair)?: A Little Help needed to walk in hospital room?: A Little Help needed climbing 3-5 steps with a railing? : A Little 6 Click Score: 18    End of Session Equipment Utilized During Treatment: Gait belt;Back brace Activity Tolerance: Patient tolerated treatment well Patient left: in bed;with call bell/phone within reach Nurse Communication: Mobility status PT Visit Diagnosis: Unsteadiness on feet (R26.81);Muscle weakness (generalized) (M62.81)    Time: 2876-8115 PT Time Calculation (min) (ACUTE ONLY): 13 min   Charges:   PT Evaluation $PT Eval Low Complexity: 1 Low          Thelton Graca A. Dan Humphreys PT, DPT Acute Rehabilitation Services Pager [phone] Office [phone]   Elissa Lovett 09/01/2020, 12:25 PM

## 2020-09-02 DIAGNOSIS — M869 Osteomyelitis, unspecified: Secondary | ICD-10-CM | POA: Diagnosis not present

## 2020-09-02 DIAGNOSIS — E119 Type 2 diabetes mellitus without complications: Secondary | ICD-10-CM | POA: Diagnosis not present

## 2020-09-02 DIAGNOSIS — M4626 Osteomyelitis of vertebra, lumbar region: Secondary | ICD-10-CM | POA: Diagnosis not present

## 2020-09-02 DIAGNOSIS — I10 Essential (primary) hypertension: Secondary | ICD-10-CM | POA: Diagnosis not present

## 2020-09-02 LAB — GLUCOSE, CAPILLARY
Glucose-Capillary: 156 mg/dL — ABNORMAL HIGH (ref 70–99)
Glucose-Capillary: 93 mg/dL (ref 70–99)
Glucose-Capillary: 122 mg/dL — ABNORMAL HIGH (ref 70–99)
Glucose-Capillary: 129 mg/dL — ABNORMAL HIGH (ref 70–99)

## 2020-09-02 LAB — SEDIMENTATION RATE: Sed Rate: 79 mm/hr — ABNORMAL HIGH (ref 0–16)

## 2020-09-02 MED ORDER — VANCOMYCIN HCL 2000 MG/400ML IV SOLN
2000.0000 mg | INTRAVENOUS | Status: DC
  Administered 2020-09-02 – 2020-09-03 (×2): 2000 mg via INTRAVENOUS
  Filled 2020-09-02 (×4): qty 400

## 2020-09-02 MED ORDER — CHLORHEXIDINE GLUCONATE CLOTH 2 % EX PADS
6.0000 | MEDICATED_PAD | Freq: Every day | CUTANEOUS | Status: DC
  Administered 2020-09-03 – 2020-09-04 (×2): 6 via TOPICAL

## 2020-09-02 MED ORDER — SODIUM CHLORIDE 0.9% FLUSH
10.0000 mL | Freq: Two times a day (BID) | INTRAVENOUS | Status: DC
  Administered 2020-09-02 – 2020-09-03 (×2): 10 mL

## 2020-09-02 MED ORDER — SODIUM CHLORIDE 0.9% FLUSH: 10.0000 mL | INTRAVENOUS | Status: DC | PRN

## 2020-09-02 NOTE — Progress Notes (Signed)
Pharmacy Antibiotic Note  Antonio Nash is a 65 y.o. male admitted on 08/31/2020 with osteomyelitis/discitis .  Pharmacy has been consulted for vancomycin dosing.  WBC remains WNL, AF. Patient's weight now updated, will adjust vancomyin dose appropriately.  Plan: - Increase vancomycin to 2000 mg IV every 24 hours (estimated AUC 427, goal 400-500) - Continue ceftriaxone 2 g IV every 24 hours - Monitor clinical status and renal function - Follow-up cultures to de-escalate   Height: 5\' 9"  (175.3 cm) Weight: 84.4 kg (186 lb) IBW/kg (Calculated) : 70.7  Temp (24hrs), Avg:99.7 F (37.6 C), Min:99.4 F (37.4 C), Max:100 F (37.8 C)  Recent Labs  Lab 08/31/20 1208 08/31/20 1215 09/01/20 0214  WBC 10.1  --  7.8  CREATININE 1.27*  --  0.99  LATICACIDVEN  --  1.4  --     Estimated Creatinine Clearance: 73.4 mL/min (by C-G formula based on SCr of 0.99 mg/dL).    Allergies  Allergen Reactions  . Codeine     Other reaction(s): Other (See Comments) Skin crawls    Antimicrobials this admission: 1/15 CTX >> 1/15 Vancomycin >>  Dose adjustments this admission: Vancomycin 1750 mg IV every 24 hours >> 2000 mg IV every 24 hours  Microbiology results: 1/14 COVID: negative 1/14 BCx x2: NG <24 hours, pending 1/15 Abscess cx: sent      Kymani Laursen L. 2/15, PharmD, MBA [facility] PGY2 Pharmacy Resident Weekends 7:00 am - 3:00 pm, please call [phone] 09/02/20      10:26 AM  Please check AMION for all [facility] Pharmacy phone numbers After 10:00 PM, call the Main Pharmacy [phone]

## 2020-09-02 NOTE — Progress Notes (Addendum)
PHARMACY CONSULT NOTE FOR: Antonio Nash  OUTPATIENT  PARENTERAL ANTIBIOTIC THERAPY (OPAT)  Indication: Osteomyelitis Regimen:  - Ceftriaxone 2 g IV every 24 hours  - Vancomycin 2000 mg IV every 24 hours End date: Friday, October 12, 2020  IV antibiotic discharge orders are pended. To discharging provider:  please sign these orders via discharge navigator,  Select New Orders & click on the button choice - Manage This Unsigned Work.     Yanilen Adamik L. Arlester Marker, PharmD, MBA [facility] PGY2 Pharmacy Resident Weekends 7:00 am - 3:00 pm, please call [phone] 09/02/20      10:18 AM  Please check AMION for all [facility] Pharmacy phone numbers After 10:00 PM, call the Main Pharmacy [phone]

## 2020-09-02 NOTE — Plan of Care (Signed)
  Problem: Education: Goal: Knowledge of General Education information will improve Description Including pain rating scale, medication(s)/side effects and non-pharmacologic comfort measures Outcome: Progressing   Problem: Health Behavior/Discharge Planning: Goal: Ability to manage health-related needs will improve Outcome: Progressing   

## 2020-09-02 NOTE — Progress Notes (Signed)
[place] [facility]   Reason for visit: Follow up on discitis, osteomyelitis  Interval History: no growth from culture to date  Physical Exam: Constitutional:  Vitals:   09/02/20 0438 09/02/20 0820  BP: 121/71 128/78  Pulse: 75 74  Resp: 16 17  Temp: 99.4 F (37.4 C) 99.6 F (37.6 C)  SpO2: 100% 100%   patient appears in NAD  Impression: discitis/osteomyelitis.  On vancomycin and ceftriaxone.   Plan: 1.  [place] for PICC line today.  2.  [place] for discharge from [facility] for antibiotics through 10/12/20 Follow up arranged with me 09/19/20  Diagnosis: Thoracic discitis   Culture Result: pending, no growth to date  Allergies  Allergen Reactions  . Codeine     Other reaction(s): Other (See Comments) Skin crawls    OPAT Orders Discharge antibiotics to be given via PICC line Discharge antibiotics: vancomycin IV per pharmacy protocol and ceftriaxone 2 grams daily IV Per pharmacy protocol yes Aim for Vancomycin trough 15-20 or AUC 400-550 (unless otherwise indicated) Duration: 6 weeks End Date: 10/09/20  [facility] Care Per Protocol: yes  Home health RN for IV administration and teaching; PICC line care and labs.    Labs weekly while on IV antibiotics: _x_ CBC with differential __ BMP _x_ CMP _x_ CRP _x_ ESR _x_ Vancomycin trough __ CK  __ Please pull PIC at completion of IV antibiotics _x_ Please leave PIC in place until doctor has seen patient or been notified  Fax weekly labs to [phone]  Clinic Follow Up Appt: 09/19/20  @ 2;30 pm with Dr. Linus Salmons

## 2020-09-02 NOTE — Progress Notes (Signed)
Peripherally Inserted Central Catheter Placement  The IV Nurse has discussed with the patient and/or persons authorized to consent for the patient, the purpose of this procedure and the potential benefits and risks involved with this procedure.  The benefits include less needle sticks, lab draws from the catheter, and the patient may be discharged home with the catheter. Risks include, but not limited to, infection, bleeding, blood clot (thrombus formation), and puncture of an artery; nerve damage and irregular heartbeat and possibility to perform a PICC exchange if needed/ordered by physician.  Alternatives to this procedure were also discussed.  Bard Power PICC patient education guide, fact sheet on infection prevention and patient information card has been provided to patient /or left at bedside.    PICC Placement Documentation  PICC Single Lumen 09/02/20 PICC Right Brachial 40 cm 0 cm (Active)  Indication for Insertion or Continuance of Line Home intravenous therapies (PICC only) 09/02/20 1837  Exposed Catheter (cm) 0 cm 09/02/20 1837  Site Assessment Clean;Dry;Intact 09/02/20 1837  Line Status Flushed;Saline locked;Blood return noted 09/02/20 1837  Dressing Type Transparent 09/02/20 1837  Dressing Status Clean;Dry;Intact 09/02/20 1837  Antimicrobial disc in place? Yes 09/02/20 1837  Safety Lock Not Applicable 09/02/20 1837  Dressing Intervention New dressing 09/02/20 1837  Dressing Change Due 09/09/20 09/02/20 1837       Ethelda Chick 09/02/2020, 6:39 PM

## 2020-09-02 NOTE — Progress Notes (Signed)
Patient ID: Antonio Nash, male   DOB: Aug 15, 1956, 65 y.o.   MRN: [id number]  PROGRESS NOTE    Antonio Nash  IZT:[id number] DOB: June 09, 1956 DOA: 08/31/2020 PCP: Patient, No Pcp Per   Brief Narrative:  65 y.o. male with medical history significant of hypertension and diabetes mellitus type II: Probably diet controlled was referred to ED on 08/30/2020 by Dr. Dumonski/orthopedics for MRI done as an outpatient which showed concern for osteomyelitis/discitis of T5-T6.  ID and IR were consulted.  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 Marshell Levan office and sent for admission and CT-guided biopsy and ID consult -Spoke to Dr. Yevette Edwards on phone on 08/31/2020: He mentioned no need for IV steroids at this time since patient is not having neurological symptoms of weakness of lower extremities.  He is expecting that the MRI findings of phlegmon/edema will improve with antibiotic treatment and patient does not need any surgical intervention at this time.   -Status post CT-guided biopsy by IR on 09/01/2020. Follow cultures. Blood cultures have been negative so far.  -ID evaluation appreciated: For PICC line placement today. Currently on Rocephin and vancomycin. We will follow-up with ID regarding duration of treatment. -Afebrile since admission.  No leukocytosis.  CRP 8.0 on 09/01/2020  Hypertension -Continue Coreg and lisinopril. -Monitor blood pressure  Diabetes mellitus type 2, probably diet controlled -A1c 7.4. CBGs with SSI  Thrombocytosis -Possibly reactive.  Monitor.  DVT prophylaxis: SCDs.  Will not start Lovenox till CT-guided biopsy Code Status: Full Family Communication: None at bedside Disposition Plan: Status is: Inpatient  Remains inpatient appropriate because:Inpatient level of care  appropriate due to severity of illness   Dispo: The patient is from: Home              Anticipated d/c is to: Home              Anticipated d/c date is: 1 day              Patient currently is not medically stable to d/c. Awaiting PICC line placement and finalization of antibiotic regimen by ID  Consultants: IR/ID.  Spoke to Dr. Yevette Edwards on phone  Procedures: None  Antimicrobials: Rocephin and vancomycin from 09/01/2020 onwards   Subjective: Patient seen and examined at bedside. Denies worsening abdominal pain, nausea, vomiting or back pain. Hoping to go home tomorrow. Objective: Vitals:   09/01/20 1500 09/01/20 1952 09/02/20 0438 09/02/20 0820  BP:  117/72 121/71 128/78  Pulse:  76 75 74  Resp:  17 16 17   Temp:  100 F (37.8 C) 99.4 F (37.4 C) 99.6 F (37.6 C)  TempSrc:  Oral Oral Oral  SpO2:  100% 100% 100%  Weight: 84.4 kg     Height: 5\' 9"  (1.753 m)       Intake/Output Summary (Last 24 hours) at 09/02/2020 0956 Last data filed at 09/02/2020 0820 Gross per 24 hour  Intake 240 ml  Output 450 ml  Net -210 ml   Filed Weights   09/01/20 1500  Weight: 84.4 kg    Examination:  General exam: No distress. Respiratory system: Bilateral decreased breath sounds at bases with some scattered crackles Cardiovascular system: Rate controlled, S1-S2 heard Gastrointestinal system: Abdomen is nondistended, soft and nontender. Bowel sounds are heard  extremities: Mild lower extremity edema. No clubbing Central nervous system: Alert  and awake. No focal neurological deficits. Moves extremities Skin: No obvious ecchymosis/lesions Psychiatry: Normal mood and affect and judgment    Data Reviewed: I have personally reviewed following labs and imaging studies  CBC: Recent Labs  Lab 08/31/20 1208 09/01/20 0214  WBC 10.1 7.8  NEUTROABS 6.6 4.0  HGB 11.9* 10.2*  HCT 39.1 32.8*  MCV 87.5 86.1  PLT 662* 580*   Basic Metabolic Panel: Recent Labs  Lab 08/31/20 1208  09/01/20 0214  NA 137 136  K 4.1 4.0  CL 102 104  CO2 24 22  GLUCOSE 132* 112*  BUN 13 15  CREATININE 1.27* 0.99  CALCIUM 9.3 8.5*  MG  --  2.1   GFR: Estimated Creatinine Clearance: 73.4 mL/min (by C-G formula based on SCr of 0.99 mg/dL). Liver Function Tests: Recent Labs  Lab 08/31/20 1208  AST 17  ALT 20  ALKPHOS 41  BILITOT 0.9  PROT 8.8*  ALBUMIN 3.3*   No results for input(s): LIPASE, AMYLASE in the last 168 hours. No results for input(s): AMMONIA in the last 168 hours. Coagulation Profile: Recent Labs  Lab 08/31/20 1208  INR 1.2   Cardiac Enzymes: No results for input(s): CKTOTAL, CKMB, CKMBINDEX, TROPONINI in the last 168 hours. BNP (last 3 results) No results for input(s): PROBNP in the last 8760 hours. HbA1C: Recent Labs    09/01/20 0214  HGBA1C 7.4*   CBG: Recent Labs  Lab 09/01/20 1708 09/01/20 2309 09/02/20 0700  GLUCAP 138* 135* 156*   Lipid Profile: No results for input(s): CHOL, HDL, LDLCALC, TRIG, CHOLHDL, LDLDIRECT in the last 72 hours. Thyroid Function Tests: No results for input(s): TSH, T4TOTAL, FREET4, T3FREE, THYROIDAB in the last 72 hours. Anemia Panel: No results for input(s): VITAMINB12, FOLATE, FERRITIN, TIBC, IRON, RETICCTPCT in the last 72 hours. Sepsis Labs: Recent Labs  Lab 08/31/20 1215  LATICACIDVEN 1.4    Recent Results (from the past 240 hour(s))  Culture, blood (Routine x 2)     Status: None (Preliminary result)   Collection Time: 08/31/20 12:15 PM   Specimen: Site Not Specified; Blood  Result Value Ref Range Status   Specimen Description SITE NOT SPECIFIED  Final   Special Requests   Final    BOTTLES DRAWN AEROBIC AND ANAEROBIC Blood Culture adequate volume   Culture   Final    NO GROWTH 2 DAYS Performed at [facility], [address] [place], [place] [address]    Report Status PENDING  Incomplete  Culture, blood (Routine x 2)     Status: None (Preliminary result)   Collection Time: 08/31/20   3:29 PM   Specimen: BLOOD  Result Value Ref Range Status   Specimen Description BLOOD RIGHT ANTECUBITAL  Final   Special Requests   Final    BOTTLES DRAWN AEROBIC AND ANAEROBIC Blood Culture adequate volume   Culture   Final    NO GROWTH 2 DAYS Performed at [facility], [address], [place], [place] [address]    Report Status PENDING  Incomplete  SARS CORONAVIRUS 2 (TAT 6-24 HRS) Nasopharyngeal Nasopharyngeal Swab     Status: None   Collection Time: 08/31/20  4:10 PM   Specimen: Nasopharyngeal Swab  Result Value Ref Range Status   SARS Coronavirus 2 NEGATIVE NEGATIVE Final    Comment: (NOTE) SARS-CoV-2 target nucleic acids are NOT DETECTED.  The SARS-CoV-2 RNA is generally detectable in upper and lower respiratory specimens during the acute phase of infection. Negative results do not  preclude SARS-CoV-2 infection, do not rule out co-infections with other pathogens, and should not be used as the sole basis for treatment or other patient management decisions. Negative results must be combined with clinical observations, patient history, and epidemiological information. The expected result is Negative.  Fact Sheet for Patients: HairSlick.no  Fact Sheet for Healthcare Providers: quierodirigir.com  This test is not yet approved or cleared by the [place] FDA and  has been authorized for detection and/or diagnosis of SARS-CoV-2 by FDA under an Emergency Use Authorization (EUA). This EUA will remain  in effect (meaning this test can be used) for the duration of the COVID-19 declaration under Se ction 564(b)(1) of the Act, 21 U.S.C. section 360bbb-3(b)(1), unless the authorization is terminated or revoked sooner.  Performed at [facility], [address], [place], [place] [address]   Aerobic/Anaerobic Culture (surgical/deep wound)     Status: None (Preliminary result)   Collection Time: 09/01/20  1:23 PM    Specimen: Abscess  Result Value Ref Range Status   Specimen Description ABSCESS  Final   Special Requests RIGHT PARASPINAL FLUID  Final   Gram Stain   Final    RARE WBC PRESENT, PREDOMINANTLY PMN NO ORGANISMS SEEN Performed at [facility], [address], [place], [place] [address]    Culture PENDING  Incomplete   Report Status PENDING  Incomplete         Radiology Studies: CT ASPIRATION  Result Date: 09/01/2020 INDICATION: 65 year old with an presumed osteomyelitis and discitis at T5-T6. Patient needs tissue sampling. EXAM: CT-GUIDED ASPIRATION OF THORACIC PARASPINAL TISSUE MEDICATIONS: Moderate sedation ANESTHESIA/SEDATION: Fentanyl 50 mcg IV; Versed 1.0 mg IV Moderate Sedation Time:  11 minutes The patient was continuously monitored during the procedure by the interventional radiology nurse under my direct supervision. COMPLICATIONS: None immediate. PROCEDURE: Informed written consent was obtained from the patient after a thorough discussion of the procedural risks, benefits and alternatives. All questions were addressed. A timeout was performed prior to the initiation of the procedure. Patient was placed prone. CT images through the thoracic spine were obtained. Paraspinal tissue and bone abnormalities at T5-T6 was targeted. The right side of the back was prepped with chlorhexidine and sterile field was created. Skin was anesthetized using 1% lidocaine. Using CT guidance, an 18 gauge trocar needle was directed into the right paraspinal tissues. Approximately 1 mL of bloody fluid was aspirated. Fluid was sent for culture. Needle was removed. Bandage placed over the puncture site. Follow up CT images were obtained. FINDINGS: Marked soft tissue and bone abnormality at the T5-T6 level compatible with previous thoracic MRI. Needle was directed into the right paraspinal tissue just lateral to the vertebral bodies. Small amount of bloody fluid was aspirated. IMPRESSION: CT-guided  aspiration of the abnormal right paraspinal tissue. Fluid was sent for culture. Electronically Signed   By: Richarda Overlie M.D.   On: 09/01/2020 13:59   DG Chest Port 1 View  Result Date: 09/01/2020 CLINICAL DATA:  Status post T-spine biopsy. EXAM: PORTABLE CHEST 1 VIEW COMPARISON:  07/16/2020 FINDINGS: Cardiac silhouette normal in size. No mediastinal or hilar masses. No evidence of a paravertebral hematoma. Clear lungs.  No pleural effusion or pneumothorax. Skeletal structures are grossly intact. IMPRESSION: No active disease. Electronically Signed   By: Amie Portland M.D.   On: 09/01/2020 13:50   [place] EKG SITE RITE  Result Date: 09/01/2020 If Site Rite image not attached, placement could not be confirmed due to current cardiac rhythm.  Scheduled Meds: . carvedilol  12.5 mg Oral BID WC  . insulin aspart  0-15 Units Subcutaneous TID WC  . insulin aspart  0-5 Units Subcutaneous QHS  . lisinopril  40 mg Oral Daily   Continuous Infusions: . cefTRIAXone (ROCEPHIN)  IV 2 g (09/01/20 1621)  . vancomycin            Antonio LloydKshitiz Gionni Freese, MD [facility] 09/02/2020, 9:56 AM

## 2020-09-02 NOTE — Plan of Care (Signed)
  Problem: Activity: Goal: Risk for activity intolerance will decrease Outcome: Progressing   Problem: Coping: Goal: Level of anxiety will decrease Outcome: Progressing   Problem: Pain Managment: Goal: General experience of comfort will improve Outcome: Progressing   Problem: Safety: Goal: Ability to remain free from injury will improve Outcome: Progressing   Problem: Skin Integrity: Goal: Risk for impaired skin integrity will decrease Outcome: Progressing   

## 2020-09-03 DIAGNOSIS — I10 Essential (primary) hypertension: Secondary | ICD-10-CM | POA: Diagnosis not present

## 2020-09-03 DIAGNOSIS — M869 Osteomyelitis, unspecified: Secondary | ICD-10-CM | POA: Diagnosis not present

## 2020-09-03 DIAGNOSIS — E119 Type 2 diabetes mellitus without complications: Secondary | ICD-10-CM | POA: Diagnosis not present

## 2020-09-03 LAB — GLUCOSE, CAPILLARY
Glucose-Capillary: 104 mg/dL — ABNORMAL HIGH (ref 70–99)
Glucose-Capillary: 111 mg/dL — ABNORMAL HIGH (ref 70–99)
Glucose-Capillary: 118 mg/dL — ABNORMAL HIGH (ref 70–99)
Glucose-Capillary: 121 mg/dL — ABNORMAL HIGH (ref 70–99)

## 2020-09-03 MED ORDER — CEFTRIAXONE IV (FOR PTA / DISCHARGE USE ONLY): 2.0000 g | INTRAVENOUS | 0 refills | Status: DC

## 2020-09-03 MED ORDER — VANCOMYCIN IV (FOR PTA / DISCHARGE USE ONLY): 2000.0000 mg | INTRAVENOUS | 0 refills | Status: DC

## 2020-09-03 MED ORDER — TRAMADOL HCL 50 MG PO TABS: 50.0000 mg | ORAL_TABLET | Freq: Four times a day (QID) | ORAL | 0 refills | Status: DC | PRN

## 2020-09-03 NOTE — Plan of Care (Signed)

## 2020-09-03 NOTE — Discharge Summary (Addendum)
Physician Discharge Summary  Antonio Nash IEP:[id number] DOB: October 19, 1955 DOA: 08/31/2020  PCP: Patient, No Pcp Per  Admit date: 08/31/2020 Discharge date: 09/03/2020  Admitted From: Home Disposition: Home  Recommendations for Outpatient Follow-up:  1. Follow up with PCP in 1 week with repeat CBC/BMP 2. Outpatient follow-up with infectious disease.  Complete IV antibiotic treatment at home as per ID recommendations 3. Follow-up with Dr. Lynann Bologna as needed 4. Follow up in ED if symptoms worsen or new appear   Home Health: Home health RN Equipment/Devices: PICC line placed on 09/02/2020  Discharge Condition: Stable CODE STATUS: Full Diet recommendation: Heart healthy/carb modified  Brief/Interim Summary:65 y.o.malewith medical history significant ofhypertension and diabetes mellitus type II: Probably diet controlled was referred to ED on 08/30/2020 by Dr. Dumonski/orthopedics for MRI done as an outpatient which showed concern for osteomyelitis/discitis of T5-T6.  ID and IR were consulted.  He underwent CT-guided biopsy by IR on 09/01/2020.  Cultures are pending.  Blood cultures are negative so far.  He had PICC line placed on 09/02/2020.  ID has recommended  Iv antibiotics till 10/12/2020 with outpatient follow-up with ID.  He is hemodynamically stable, afebrile with no worsening neurological symptoms.  He will be discharged home today.   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 Laurena Bering office and sent for admission and CT-guided biopsy and ID consult -Spoke to Dr. Lynann Bologna on phone on 08/31/2020: He mentioned no need forIV steroids at this time since patient is not having neurological symptoms of weakness of lower extremities. He is expecting that the MRI findings of phlegmon/edema will improve  with antibiotic treatment and patient does not need any surgical intervention at this time.  -Status post CT-guided biopsy by IR on 09/01/2020.  -Cultures are pending.  Blood cultures are negative so far.  He had PICC line placed on 09/02/2020.  ID has recommended iv antibiotics till 10/12/2020 with outpatient follow-up with ID.  ID has cleared the patient for discharge. -He is hemodynamically stable, afebrile with no worsening neurological symptoms.  He will be discharged home today.  Hypertension -Continue Coreg and lisinopril. -Outpatient follow-up.  Diabetes mellitus type 2, probably diet controlled -A1c 7.4.  Carb modified diet.  Outpatient follow-up  Thrombocytosis -Possibly reactive. Monitor.  Discharge Instructions  Discharge Instructions    [facility] pharmacist to adjust dose for Vancomycin, Aminoglycosides and other anti-infective therapies as requested by physician.   Complete by: As directed    [facility] to provide Cath Flo 36m   Complete by: As directed    Administer for PICC line occlusion and as ordered by physician for other access device issues.   Ambulatory referral to Infectious Disease   Complete by: As directed    Osteomyelitis followup   Anaphylaxis Kit: Provided to treat any anaphylactic reaction to the medication being provided to the patient if First Dose or when requested by physician   Complete by: As directed    Epinephrine 157mml vial / amp: Administer 0.80m71m0.80ml2mubcutaneously once for moderate to severe anaphylaxis, nurse to call physician and pharmacy when reaction occurs and call 911 if needed for immediate care   Diphenhydramine 50mg25mIV vial: Administer 25-50mg 47mM PRN for first dose reaction, rash, itching, mild reaction, nurse to call physician and pharmacy when reaction occurs   Sodium Chloride 0.9% NS 500ml I35mdminister if  needed for hypovolemic blood pressure drop or as ordered by physician after call to  physician with anaphylactic reaction   Change dressing on IV access line weekly and PRN   Complete by: As directed    Diet - low sodium heart healthy   Complete by: As directed    Diet Carb Modified   Complete by: As directed    Flush IV access with Sodium Chloride 0.9% and Heparin 10 units/ml or 100 units/ml   Complete by: As directed    Home infusion instructions - [facility]   Complete by: As directed    Instructions: Flush IV access with Sodium Chloride 0.9% and Heparin 10units/ml or 100units/ml   Change dressing on IV access line: Weekly and PRN   Instructions Cath Flo 31m: Administer for PICC Line occlusion and as ordered by physician for other access device   [facility] pharmacist to adjust dose for: Vancomycin, Aminoglycosides and other anti-infective therapies as requested by physician   Increase activity slowly   Complete by: As directed    Method of administration may be changed at the discretion of home infusion pharmacist based upon assessment of the patient and/or caregiver's ability to self-administer the medication ordered   Complete by: As directed    No wound care   Complete by: As directed      Anti-infectives (From admission, onward)   Start     Dose/Rate Route Frequency Ordered Stop   09/04/20 1200  DAPTOmycin (CUBICIN) 750 mg in sodium chloride 0.9 % IVPB        750 mg 230 mL/hr over 30 Minutes Intravenous Daily 09/04/20 1045     09/04/20 0000  daptomycin (CUBICIN) IVPB        750 mg Intravenous Every 24 hours 09/04/20 1105 10/12/20 2359   09/03/20 0000  cefTRIAXone (ROCEPHIN) IVPB        2 g Intravenous Every 24 hours 09/03/20 0852 10/13/20 2359   09/03/20 0000  vancomycin IVPB  Status:  Discontinued        2,000 mg Intravenous Every 24 hours 09/03/20 0852 09/04/20    09/02/20 1500  vancomycin (VANCOREADY) IVPB 1750 mg/350 mL  Status:  Discontinued        1,750 mg 175 mL/hr over 120 Minutes Intravenous Every 24 hours 09/01/20 1425  09/02/20 1027   09/02/20 1500  vancomycin (VANCOREADY) IVPB 2000 mg/400 mL  Status:  Discontinued        2,000 mg 200 mL/hr over 120 Minutes Intravenous Every 24 hours 09/02/20 1027 09/04/20 1045   09/01/20 1515  vancomycin (VANCOREADY) IVPB 2000 mg/400 mL        2,000 mg 200 mL/hr over 120 Minutes Intravenous  Once 09/01/20 1425 09/01/20 1923   09/01/20 1445  cefTRIAXone (ROCEPHIN) 2 g in sodium chloride 0.9 % 100 mL IVPB        2 g 200 mL/hr over 30 Minutes Intravenous Every 24 hours 09/01/20 1356         Follow-up Information    PCP. Schedule an appointment as soon as possible for a visit in 1 week(s).              Allergies  Allergen Reactions  . Codeine     Other reaction(s): Other (See Comments) Skin crawls    Consultations: IR/ID.Spoke toDr. DMaye Hidesphone   Procedures/Studies: CT ASPIRATION  Result Date: 09/01/2020 INDICATION: 65year old with an presumed osteomyelitis and discitis at T5-T6. Patient needs tissue sampling. EXAM: CT-GUIDED ASPIRATION OF THORACIC  PARASPINAL TISSUE MEDICATIONS: Moderate sedation ANESTHESIA/SEDATION: Fentanyl 50 mcg IV; Versed 1.0 mg IV Moderate Sedation Time:  11 minutes The patient was continuously monitored during the procedure by the interventional radiology nurse under my direct supervision. COMPLICATIONS: None immediate. PROCEDURE: Informed written consent was obtained from the patient after a thorough discussion of the procedural risks, benefits and alternatives. All questions were addressed. A timeout was performed prior to the initiation of the procedure. Patient was placed prone. CT images through the thoracic spine were obtained. Paraspinal tissue and bone abnormalities at T5-T6 was targeted. The right side of the back was prepped with chlorhexidine and sterile field was created. Skin was anesthetized using 1% lidocaine. Using CT guidance, an 18 gauge trocar needle was directed into the right paraspinal tissues. Approximately 1  mL of bloody fluid was aspirated. Fluid was sent for culture. Needle was removed. Bandage placed over the puncture site. Follow up CT images were obtained. FINDINGS: Marked soft tissue and bone abnormality at the T5-T6 level compatible with previous thoracic MRI. Needle was directed into the right paraspinal tissue just lateral to the vertebral bodies. Small amount of bloody fluid was aspirated. IMPRESSION: CT-guided aspiration of the abnormal right paraspinal tissue. Fluid was sent for culture. Electronically Signed   By: Markus Daft M.D.   On: 09/01/2020 13:59   DG Chest Port 1 View  Result Date: 09/01/2020 CLINICAL DATA:  Status post T-spine biopsy. EXAM: PORTABLE CHEST 1 VIEW COMPARISON:  07/16/2020 FINDINGS: Cardiac silhouette normal in size. No mediastinal or hilar masses. No evidence of a paravertebral hematoma. Clear lungs.  No pleural effusion or pneumothorax. Skeletal structures are grossly intact. IMPRESSION: No active disease. Electronically Signed   By: Lajean Manes M.D.   On: 09/01/2020 13:50   [place] EKG SITE RITE  Result Date: 09/01/2020 If Site Rite image not attached, placement could not be confirmed due to current cardiac rhythm.      Subjective: Patient seen and examined at bedside.  Denies worsening back pain.  No overnight fever, vomiting, shortness of breath reported.  Discharge Exam: Vitals:   09/03/20 0739 09/03/20 0743  BP: 128/74 (!) 121/92  Pulse: 75 82  Resp: 16 17  Temp: 98.7 F (37.1 C) 98.7 F (37.1 C)  SpO2: 100% 100%    General: Pt is alert, awake, not in acute distress Cardiovascular: rate controlled, S1/S2 + Respiratory: bilateral decreased breath sounds at bases Abdominal: Soft, NT, ND, bowel sounds + Extremities: Trace lower extremity edema; no cyanosis    The results of significant diagnostics from this hospitalization (including imaging, microbiology, ancillary and laboratory) are listed below for reference.     Microbiology: Recent Results  (from the past 240 hour(s))  Culture, blood (Routine x 2)     Status: None (Preliminary result)   Collection Time: 08/31/20 12:15 PM   Specimen: Site Not Specified; Blood  Result Value Ref Range Status   Specimen Description SITE NOT SPECIFIED  Final   Special Requests   Final    BOTTLES DRAWN AEROBIC AND ANAEROBIC Blood Culture adequate volume   Culture   Final    NO GROWTH 2 DAYS Performed at [facility], [address] [place], [place] [address]    Report Status PENDING  Incomplete  Culture, blood (Routine x 2)     Status: None (Preliminary result)   Collection Time: 08/31/20  3:29 PM   Specimen: BLOOD  Result Value Ref Range Status   Specimen Description BLOOD RIGHT ANTECUBITAL  Final  Special Requests   Final    BOTTLES DRAWN AEROBIC AND ANAEROBIC Blood Culture adequate volume   Culture   Final    NO GROWTH 2 DAYS Performed at [facility], [place] [address] [place], [place] [address]    Report Status PENDING  Incomplete  SARS CORONAVIRUS 2 (TAT 6-24 HRS) Nasopharyngeal Nasopharyngeal Swab     Status: None   Collection Time: 08/31/20  4:10 PM   Specimen: Nasopharyngeal Swab  Result Value Ref Range Status   SARS Coronavirus 2 NEGATIVE NEGATIVE Final    Comment: (NOTE) SARS-CoV-2 target nucleic acids are NOT DETECTED.  The SARS-CoV-2 RNA is generally detectable in upper and lower respiratory specimens during the acute phase of infection. Negative results do not preclude SARS-CoV-2 infection, do not rule out co-infections with other pathogens, and should not be used as the sole basis for treatment or other patient management decisions. Negative results must be combined with clinical observations, patient history, and epidemiological information. The expected result is Negative.  Fact Sheet for Patients: SugarRoll.be  Fact Sheet for Healthcare Providers: https://www.woods-mathews.com/  This test is not yet approved  or cleared by the [place] FDA and  has been authorized for detection and/or diagnosis of SARS-CoV-2 by FDA under an Emergency Use Authorization (EUA). This EUA will remain  in effect (meaning this test can be used) for the duration of the COVID-19 declaration under Se ction 564(b)(1) of the Act, 21 U.S.C. section 360bbb-3(b)(1), unless the authorization is terminated or revoked sooner.  Performed at [facility], [place] [address], [place], [place] [address]   Aerobic/Anaerobic Culture (surgical/deep wound)     Status: None (Preliminary result)   Collection Time: 09/01/20  1:23 PM   Specimen: Abscess  Result Value Ref Range Status   Specimen Description ABSCESS  Final   Special Requests RIGHT PARASPINAL FLUID  Final   Gram Stain   Final    RARE WBC PRESENT, PREDOMINANTLY PMN NO ORGANISMS SEEN    Culture   Final    NO GROWTH < 24 HOURS Performed at [facility], [place] [address], [place], [place] [address]    Report Status PENDING  Incomplete     Labs: BNP (last 3 results) No results for input(s): BNP in the last 8760 hours. Basic Metabolic Panel: Recent Labs  Lab 08/31/20 1208 09/01/20 0214  NA 137 136  K 4.1 4.0  CL 102 104  CO2 24 22  GLUCOSE 132* 112*  BUN 13 15  CREATININE 1.27* 0.99  CALCIUM 9.3 8.5*  MG  --  2.1   Liver Function Tests: Recent Labs  Lab 08/31/20 1208  AST 17  ALT 20  ALKPHOS 41  BILITOT 0.9  PROT 8.8*  ALBUMIN 3.3*   No results for input(s): LIPASE, AMYLASE in the last 168 hours. No results for input(s): AMMONIA in the last 168 hours. CBC: Recent Labs  Lab 08/31/20 1208 09/01/20 0214  WBC 10.1 7.8  NEUTROABS 6.6 4.0  HGB 11.9* 10.2*  HCT 39.1 32.8*  MCV 87.5 86.1  PLT 662* 580*   Cardiac Enzymes: No results for input(s): CKTOTAL, CKMB, CKMBINDEX, TROPONINI in the last 168 hours. BNP: Invalid input(s): POCBNP CBG: Recent Labs  Lab 09/02/20 0700 09/02/20 1200 09/02/20 1752 09/02/20 2247 09/03/20 0733   GLUCAP 156* 93 122* 129* 118*   D-Dimer No results for input(s): DDIMER in the last 72 hours. Hgb A1c Recent Labs    09/01/20 0214  HGBA1C 7.4*  Lipid Profile No results for input(s): CHOL, HDL, LDLCALC, TRIG, CHOLHDL, LDLDIRECT in the last 72 hours. Thyroid function studies No results for input(s): TSH, T4TOTAL, T3FREE, THYROIDAB in the last 72 hours.  Invalid input(s): FREET3 Anemia work up No results for input(s): VITAMINB12, FOLATE, FERRITIN, TIBC, IRON, RETICCTPCT in the last 72 hours. Urinalysis    Component Value Date/Time   COLORURINE YELLOW 07/16/2020 0641   APPEARANCEUR CLOUDY (A) 07/16/2020 0641   LABSPEC >=1.030 07/19/2020 1943   PHURINE 5.0 07/19/2020 1943   GLUCOSEU NEGATIVE 07/19/2020 1943   HGBUR MODERATE (A) 07/19/2020 1943   BILIRUBINUR SMALL (A) 07/19/2020 1943   KETONESUR 80 (A) 07/19/2020 1943   PROTEINUR >=300 (A) 07/19/2020 1943   UROBILINOGEN 1.0 07/19/2020 1943   NITRITE NEGATIVE 07/19/2020 1943   LEUKOCYTESUR NEGATIVE 07/19/2020 1943   Sepsis Labs Invalid input(s): PROCALCITONIN,  WBC,  LACTICIDVEN Microbiology Recent Results (from the past 240 hour(s))  Culture, blood (Routine x 2)     Status: None (Preliminary result)   Collection Time: 08/31/20 12:15 PM   Specimen: Site Not Specified; Blood  Result Value Ref Range Status   Specimen Description SITE NOT SPECIFIED  Final   Special Requests   Final    BOTTLES DRAWN AEROBIC AND ANAEROBIC Blood Culture adequate volume   Culture   Final    NO GROWTH 2 DAYS Performed at [facility], [address] [place], [place] [address]    Report Status PENDING  Incomplete  Culture, blood (Routine x 2)     Status: None (Preliminary result)   Collection Time: 08/31/20  3:29 PM   Specimen: BLOOD  Result Value Ref Range Status   Specimen Description BLOOD RIGHT ANTECUBITAL  Final   Special Requests   Final    BOTTLES DRAWN AEROBIC AND ANAEROBIC Blood Culture adequate volume   Culture   Final     NO GROWTH 2 DAYS Performed at [facility], [place] [address] [place], [place] [address]    Report Status PENDING  Incomplete  SARS CORONAVIRUS 2 (TAT 6-24 HRS) Nasopharyngeal Nasopharyngeal Swab     Status: None   Collection Time: 08/31/20  4:10 PM   Specimen: Nasopharyngeal Swab  Result Value Ref Range Status   SARS Coronavirus 2 NEGATIVE NEGATIVE Final    Comment: (NOTE) SARS-CoV-2 target nucleic acids are NOT DETECTED.  The SARS-CoV-2 RNA is generally detectable in upper and lower respiratory specimens during the acute phase of infection. Negative results do not preclude SARS-CoV-2 infection, do not rule out co-infections with other pathogens, and should not be used as the sole basis for treatment or other patient management decisions. Negative results must be combined with clinical observations, patient history, and epidemiological information. The expected result is Negative.  Fact Sheet for Patients: SugarRoll.be  Fact Sheet for Healthcare Providers: https://www.woods-mathews.com/  This test is not yet approved or cleared by the [place] FDA and  has been authorized for detection and/or diagnosis of SARS-CoV-2 by FDA under an Emergency Use Authorization (EUA). This EUA will remain  in effect (meaning this test can be used) for the duration of the COVID-19 declaration under Se ction 564(b)(1) of the Act, 21 U.S.C. section 360bbb-3(b)(1), unless the authorization is terminated or revoked sooner.  Performed at [facility], [place] [address], [place],  [address]   Aerobic/Anaerobic Culture (surgical/deep wound)     Status: None (Preliminary result)   Collection Time: 09/01/20  1:23 PM   Specimen: Abscess  Result Value  Ref Range Status   Specimen Description ABSCESS  Final   Special Requests RIGHT PARASPINAL FLUID  Final   Gram Stain   Final    RARE WBC PRESENT, PREDOMINANTLY PMN NO ORGANISMS SEEN     Culture   Final    NO GROWTH < 24 HOURS Performed at [place] [address], [place], [place] [address]    Report Status PENDING  Incomplete     Time coordinating discharge: 35 minutes  SIGNED:   Aline August, MD  [facility] 09/03/2020, 8:52 AM

## 2020-09-03 NOTE — TOC Initial Note (Addendum)
Transition of Care [facility]) - Initial/Assessment Note    Patient Details  Name: Antonio Nash MRN: [id number] Date of Birth: 02-24-1956  Transition of Care [facility] CM/SW Contact:    Epifanio Lesches, RN Phone Number: 09/03/2020, 10:10 AM  Clinical Narrative:                 Presents with T5-6 discitis/osteomyelitis , worsening back pain. From home with supportive wife. Pt states works [employer]. PTA independent with ADL's, no DME usage.   NCM called and left voice message with [facility] transfer coordinator informing of pt's current [facility] visit...[facility]  Pt states PCP: Dr. Leotis Shames [place] [place]  Per ID pt will need LT IV ABX until 10/12/2020. PICC line pending. Referral made with [facility] and [facility] approval pending. [facility] with [facility] benefits , authorization can take up to 24-48 hrs for approval, MD and pt made aware. Home infusion teaching needs to be completed per [facility] per to d/c. Pt states wife to assist with home infusion. Pt's home address: [address] [place], [address].  TOC team will continue to monitor and assist with needs......Marland Kitchen   09/04/2019 @ 1149 NCM received acceptance from [facility]/ [place] for [facility] services.  Expected Discharge Plan: Home w Home Health Services Barriers to Discharge: Other (comment) (PICC pending, awaiting authorization for home infusion)   Patient Goals and CMS Choice Patient states their goals for this hospitalization and ongoing recovery are:: to get home   Choice offered to / list presented to : Patient  Expected Discharge Plan and Services Expected Discharge Plan: Home w Home Health Services   Discharge Planning Services: CM Consult   Living arrangements for the past 2 months: Single Family Home Expected Discharge Date: 09/03/20                 DME Agency:  ([facility]/ IVabx therapy) Date DME Agency Contacted: 09/03/20 Time DME Agency  Contacted: 1004 Representative spoke with at DME Agency: [facility] HH Arranged: RN HH Agency: [facility] Date [facility] Agency Contacted: 09/03/20 Time HH Agency Contacted: 1005 Representative spoke with at [facility] Agency: Kandee Keen  Prior Living Arrangements/Services Living arrangements for the past 2 months: Single Family Home Lives with:: Spouse Patient language and need for interpreter reviewed:: Yes Do you feel safe going back to the place where you live?: Yes      Need for Family Participation in Patient Care: Yes (Comment) Care giver support system in place?: Yes (comment)   Criminal Activity/Legal Involvement Pertinent to Current Situation/Hospitalization: No - Comment as needed  Activities of Daily Living      Permission Sought/Granted                  Emotional Assessment Appearance:: Appears stated age Attitude/Demeanor/Rapport: Gracious Affect (typically observed): Accepting Orientation: : Oriented to Self,Oriented to Place,Oriented to  Time,Oriented to Situation Alcohol / Substance Use: Not Applicable Psych Involvement: No (comment)  Admission diagnosis:  Osteomyelitis (HCC) [M86.9] Osteomyelitis of other site, unspecified type [facility]) [M86.9] Patient Active Problem List   Diagnosis Date Noted  . Osteomyelitis (HCC) 08/31/2020  . HTN (hypertension) 08/31/2020  . Type 2 diabetes mellitus without complication (HCC) 08/31/2020   PCP:  Patient, No Pcp Per Pharmacy:   [id number] [place], [place] - [address] AT [facility] [address] [place] [address] Phone: [phone] Fax: [phone]     Social  Determinants of Health (SDOH) Interventions    Readmission Risk Interventions No flowsheet data found.

## 2020-09-04 ENCOUNTER — Encounter (HOSPITAL_COMMUNITY): Payer: Self-pay | Admitting: Internal Medicine

## 2020-09-04 LAB — GLUCOSE, CAPILLARY
Glucose-Capillary: 107 mg/dL — ABNORMAL HIGH (ref 70–99)
Glucose-Capillary: 102 mg/dL — ABNORMAL HIGH (ref 70–99)
Glucose-Capillary: 119 mg/dL — ABNORMAL HIGH (ref 70–99)

## 2020-09-04 LAB — BASIC METABOLIC PANEL
Anion gap: 10 (ref 5–15)
BUN: 13 mg/dL (ref 8–23)
CO2: 22 mmol/L (ref 22–32)
Calcium: 8.7 mg/dL — ABNORMAL LOW (ref 8.9–10.3)
Chloride: 102 mmol/L (ref 98–111)
Creatinine, Ser: 0.88 mg/dL (ref 0.61–1.24)
GFR, Estimated: >60 mL/min (ref >60)
Glucose, Bld: 122 mg/dL — ABNORMAL HIGH (ref 70–99)
Potassium: 4 mmol/L (ref 3.5–5.1)
Sodium: 134 mmol/L — ABNORMAL LOW (ref 135–145)

## 2020-09-04 LAB — CK: Total CK: 61 U/L (ref 49–397)

## 2020-09-04 MED ORDER — OXYCODONE HCL 5 MG PO TABS: 5.0000 mg | ORAL_TABLET | Freq: Four times a day (QID) | ORAL | Status: DC | PRN

## 2020-09-04 MED ORDER — SODIUM CHLORIDE 0.9 % IV SOLN
750.0000 mg | Freq: Every day | INTRAVENOUS | Status: DC
  Administered 2020-09-04: 750 mg via INTRAVENOUS
  Filled 2020-09-04: qty 15

## 2020-09-04 MED ORDER — ACETAMINOPHEN 325 MG PO TABS
650.0000 mg | ORAL_TABLET | Freq: Four times a day (QID) | ORAL | Status: DC | PRN
  Administered 2020-09-04: 650 mg via ORAL
  Filled 2020-09-04: qty 2

## 2020-09-04 MED ORDER — DAPTOMYCIN IV (FOR PTA / DISCHARGE USE ONLY): 750.0000 mg | INTRAVENOUS | 0 refills | Status: DC

## 2020-09-04 NOTE — Plan of Care (Signed)
  Problem: Coping: Goal: Level of anxiety will decrease Outcome: Progressing   Problem: Pain Managment: Goal: General experience of comfort will improve Outcome: Progressing   Problem: Safety: Goal: Ability to remain free from injury will improve Outcome: Progressing   

## 2020-09-04 NOTE — Progress Notes (Signed)
Patient ID: Antonio Nash, male   DOB: July 12, 1956, 65 y.o.   MRN: [id number] Patient was supposed to be discharged home on 09/03/2020 but home antibiotics infusion arrangements are still being made and hence patient is still in the hospital.  Patient seen and examined at bedside.  He is currently medically stable for discharge.  Please refer to the full discharge summary done by me on 09/03/2020 for full details.

## 2020-09-04 NOTE — Progress Notes (Addendum)
Pharmacy Antibiotic Note  Antonio Nash is a 65 y.o. male admitted on 08/31/2020 with osteomyelitis/discitis .  Originally was planned to do ceftriaxone and vancomycin for culture negative vertebral osteomyelitis. However, patient can have antibiotics covered at the [place] so we will do daptoymcin instead of vancomycin, continue ceftriaxone as planned. Spoke with Dr. Daiva Eves who agrees with the plan.  Plan: -Stop vancomycin -Start daptomycin 750mg  IV daily (9mg /kg) -Continue ceftriaxone 2 g IV every 24 hours - Monitor clinical status and renal function - Follow-up cultures to de-escalate   Height: 5\' 9"  (175.3 cm) Weight: 84.4 kg (186 lb) IBW/kg (Calculated) : 70.7  Temp (24hrs), Avg:98.8 F (37.1 C), Min:98.5 F (36.9 C), Max:99.1 F (37.3 C)  Recent Labs  Lab 08/31/20 1208 08/31/20 1215 09/01/20 0214 09/04/20 0321  WBC 10.1  --  7.8  --   CREATININE 1.27*  --  0.99 0.88  LATICACIDVEN  --  1.4  --   --     Estimated Creatinine Clearance: 82.6 mL/min (by C-G formula based on SCr of 0.88 mg/dL).    Allergies  Allergen Reactions  . Codeine     Other reaction(s): Other (See Comments) Skin crawls    Antimicrobials this admission: 1/15 CTX >> 1/15 Vancomycin >>  Dose adjustments this admission: Vancomycin 1750 mg IV every 24 hours >> 2000 mg IV every 24 hours  Microbiology results: 1/14 COVID: negative 1/14 BCx x2: NG <24 hours, pending 1/15 Abscess cx: sent      2/14, PharmD, BCIDP Infectious Disease Pharmacist  Phone: [phone] 09/04/20      10:47 AM  Please check AMION for all [facility] phone numbers After 10:00 PM, call the Main Pharmacy [phone]

## 2020-09-04 NOTE — Progress Notes (Signed)
PHARMACY CONSULT NOTE FOR: Antonio Nash  OUTPATIENT  PARENTERAL ANTIBIOTIC THERAPY (OPAT)  Indication: Osteomyelitis Regimen:  - Ceftriaxone 2 g IV every 24 hours  - Daptomycin 750 mg IV every 24 hours End date: Friday, October 12, 2020  IV antibiotic discharge orders are pended. To discharging provider:  please sign these orders via discharge navigator,  Select New Orders & click on the button choice - Manage This Unsigned Work.     Jettie Pagan, PharmD, BCIDP Infectious Disease Pharmacist  Phone: [phone] 09/04/20      10:46 AM  Please check AMION for all [facility] phone numbers After 10:00 PM, call the Main Pharmacy [phone]

## 2020-09-04 NOTE — Progress Notes (Signed)
Discharge package printed and instructions given to patient. Patient verbalizes understanding. 

## 2020-09-04 NOTE — Progress Notes (Signed)
Physical Therapy Treatment Patient Details Name: Antonio Nash MRN: [id number] DOB: March 16, 1956 Today's Date: 09/04/2020    History of Present Illness Patient is a 65 y/o male with PMH significant for DM, HTN, and diverticulitis. Patient found to have osteomyelitis/discitis of T5-6 area.    PT Comments    Pt reporting moderate back pain upon PT arrival to room, agreeable to OOB with prompting. Pt ambulatory around unit today, but required use of RW as pt with multiple LOB without use of AD. Pt states his LEs feel weak, instructed in EOB exercise and encouraged to perform sit to stands later with RN assist for LE strengthening. Will continue to follow.     Follow Up Recommendations  Outpatient PT     Equipment Recommendations  None recommended by PT;Other (comment) (pt states his father has an extra RW he can use)    Recommendations for Other Services       Precautions / Restrictions Precautions Precautions: Fall;Back Precaution Booklet Issued: No Restrictions Weight Bearing Restrictions: No    Mobility  Bed Mobility Overal bed mobility: Needs Assistance Bed Mobility: Supine to Sit;Sit to Supine     Supine to sit: Min assist Sit to supine: Supervision   General bed mobility comments: min assist for pull to sit from sidelying position, pt did good job of not forward flexing spine. Increased time and effort.  Transfers Overall transfer level: Needs assistance Equipment used: None Transfers: Sit to/from Stand Sit to Stand: Min guard         General transfer comment: for safety, reaching for environment to self-steady once standing.  Ambulation/Gait Ambulation/Gait assistance: Min assist Gait Distance (Feet): 400 Feet Assistive device: Rolling walker (2 wheeled);1 person hand held assist Gait Pattern/deviations: Step-through pattern;Decreased stride length;Drifts right/left;Trunk flexed Gait velocity: decr   General Gait Details: Initially performing gait  training without AD, pt with LOB x2 especially when manipulating environment (opening door, turning around bed) so PT opted for RW use. Verbal cuing for upright posture, placement inside RW.   Stairs             Wheelchair Mobility    Modified Rankin (Stroke Patients Only)       Balance Overall balance assessment: Needs assistance Sitting-balance support: No upper extremity supported;Feet supported Sitting balance-Leahy Scale: Good     Standing balance support: No upper extremity supported;During functional activity Standing balance-Leahy Scale: Poor Standing balance comment: reliant on external assist today                            Cognition Arousal/Alertness: Awake/alert Behavior During Therapy: WFL for tasks assessed/performed Overall Cognitive Status: Within Functional Limits for tasks assessed                                        Exercises General Exercises - Lower Extremity Long Arc Quad: AROM;Both;10 reps;Seated    General Comments        Pertinent Vitals/Pain Pain Assessment: Faces Faces Pain Scale: Hurts little more Pain Location: back Pain Descriptors / Indicators: Grimacing Pain Intervention(s): Limited activity within patient's tolerance;Monitored during session;Repositioned    Home Living                      Prior Function            PT Goals (current goals can now  be found in the care plan section) Acute Rehab PT Goals Patient Stated Goal: to reduce pain PT Goal Formulation: With patient Time For Goal Achievement: 09/15/20 Potential to Achieve Goals: Good Progress towards PT goals: Progressing toward goals    Frequency    Min 3X/week      PT Plan Current plan remains appropriate    Co-evaluation              AM-PAC PT "6 Clicks" Mobility   Outcome Measure  Help needed turning from your back to your side while in a flat bed without using bedrails?: A Little Help needed moving  from lying on your back to sitting on the side of a flat bed without using bedrails?: A Little Help needed moving to and from a bed to a chair (including a wheelchair)?: A Little Help needed standing up from a chair using your arms (e.g., wheelchair or bedside chair)?: A Little Help needed to walk in hospital room?: A Little Help needed climbing 3-5 steps with a railing? : A Little 6 Click Score: 18    End of Session   Activity Tolerance: Patient tolerated treatment well;Patient limited by pain Patient left: in bed;with call bell/phone within reach;with family/visitor present Nurse Communication: Mobility status PT Visit Diagnosis: Unsteadiness on feet (R26.81);Muscle weakness (generalized) (M62.81)     Time: 6073-7106 PT Time Calculation (min) (ACUTE ONLY): 14 min  Charges:  $Gait Training: 8-22 mins                     Marye Round, PT Acute Rehabilitation Services Pager [phone]  Office [phone]    Tyrone Apple E Christain [place] 09/04/2020, 5:11 PM

## 2020-09-05 LAB — CULTURE, BLOOD (ROUTINE X 2)
Culture: NO GROWTH
Culture: NO GROWTH
Special Requests: ADEQUATE
Special Requests: ADEQUATE

## 2020-09-05 NOTE — TOC Transition Note (Signed)
Transition of Care [facility]) - CM/SW Discharge Note   Patient Details  Name: Antonio Nash MRN: [id number] Date of Birth: [birth year]  Transition of Care [facility]) CM/SW Contact:  Deatra Robinson, [place] Phone Number: 09/05/2020, 11:43 AM   Clinical Narrative:   [facility]X [facility] by [facility]. Teaching completed by Pam with [facility]. Cory with [facility] aware of HHRN need.     Final next level of care: Home w Home Health Services Barriers to Discharge: No Barriers Identified   Patient Goals and CMS Choice Patient states their goals for this hospitalization and ongoing recovery are:: to get home CMS Medicare.gov Compare Post Acute Care list provided to:: Patient Choice offered to / list presented to : Patient  Discharge Placement                  Name of family member notified: [name] Patient and family notified of of transfer: 09/04/20  Discharge Plan and Services   Discharge Planning Services: CM Consult              DME Agency:  ([facility]/ IVabx therapy) Date DME Agency Contacted: 09/03/20 Time DME Agency Contacted: 1004 Representative spoke with at DME Agency: Elita Quick HH Arranged: RN HH Agency: [facility] Date [facility] Agency Contacted: 09/04/20 Time HH Agency Contacted: 1600 Representative spoke with at [facility] Agency: Elita Quick ([facility]) Kandee Keen [name])  Social Determinants of Health (SDOH) Interventions     Readmission Risk Interventions No flowsheet data found.

## 2020-09-06 LAB — AEROBIC/ANAEROBIC CULTURE W GRAM STAIN (SURGICAL/DEEP WOUND): Culture: NO GROWTH

## 2020-09-14 DIAGNOSIS — M4620 Osteomyelitis of vertebra, site unspecified: Secondary | ICD-10-CM | POA: Diagnosis not present

## 2020-09-14 DIAGNOSIS — M4640 Discitis, unspecified, site unspecified: Secondary | ICD-10-CM | POA: Diagnosis not present

## 2020-09-14 DIAGNOSIS — M25552 Pain in left hip: Secondary | ICD-10-CM | POA: Diagnosis not present

## 2020-09-14 DIAGNOSIS — I10 Essential (primary) hypertension: Secondary | ICD-10-CM | POA: Diagnosis not present

## 2020-09-18 DIAGNOSIS — J189 Pneumonia, unspecified organism: Secondary | ICD-10-CM

## 2020-09-18 HISTORY — DX: Pneumonia, unspecified organism: J18.9

## 2020-09-19 ENCOUNTER — Encounter: Payer: Self-pay | Admitting: Internal Medicine

## 2020-09-19 ENCOUNTER — Ambulatory Visit (INDEPENDENT_AMBULATORY_CARE_PROVIDER_SITE_OTHER): Payer: No Typology Code available for payment source | Admitting: Internal Medicine

## 2020-09-19 ENCOUNTER — Other Ambulatory Visit: Payer: Self-pay

## 2020-09-19 DIAGNOSIS — Z5181 Encounter for therapeutic drug level monitoring: Secondary | ICD-10-CM | POA: Insufficient documentation

## 2020-09-19 DIAGNOSIS — M4645 Discitis, unspecified, thoracolumbar region: Secondary | ICD-10-CM | POA: Insufficient documentation

## 2020-09-19 DIAGNOSIS — Z452 Encounter for adjustment and management of vascular access device: Secondary | ICD-10-CM | POA: Diagnosis not present

## 2020-09-19 NOTE — Assessment & Plan Note (Signed)
Will continue to follow labs including inflammatory markers.

## 2020-09-19 NOTE — Assessment & Plan Note (Signed)
He is doing better overall with improved pain.  No recent labs for comparison. I will have him return around 2/22 to see if he is doing well and can stop antibiotics at that time.  rtc 3 weeks

## 2020-09-19 NOTE — Progress Notes (Signed)
   Subjective:    Patient ID: Antonio Nash, male    DOB: 20-Feb-1956, 65 y.o.   MRN: [id number]  HPI Here for hsfu. He had several months of back pain and went to see Dr. Lynann Bologna and MRI c/w T5-6 discitis/osteomyelitis, though film or report not available to me.  He was sent in for management in the hospital and underwent IR guided biopsy, placement of picc line and started on vancomycin and ceftriaxone.  Culture remained negative and he has remained on the two antibiotics.  Baseline CRP of 8.0 and ESR 79.  He is feeling better with less back pain.  He did have a fall at home and using the walker more now.  No associated rash or diarrhea.    Review of Systems  Constitutional: Negative for fever.  Gastrointestinal: Negative for diarrhea and nausea.  Skin: Negative for rash.       Objective:   Physical Exam Eyes:     General: No scleral icterus. Cardiovascular:     Rate and Rhythm: Normal rate and regular rhythm.  Pulmonary:     Effort: Pulmonary effort is normal.  Neurological:     General: No focal deficit present.     Mental Status: He is alert.  Psychiatric:        Mood and Affect: Mood normal.   SH: no tobacco        Assessment & Plan:

## 2020-09-19 NOTE — Assessment & Plan Note (Signed)
Working well, no issues.   

## 2020-10-02 ENCOUNTER — Ambulatory Visit (INDEPENDENT_AMBULATORY_CARE_PROVIDER_SITE_OTHER): Payer: BC Managed Care – PPO | Admitting: Orthopaedic Surgery

## 2020-10-02 ENCOUNTER — Ambulatory Visit (INDEPENDENT_AMBULATORY_CARE_PROVIDER_SITE_OTHER): Payer: BC Managed Care – PPO

## 2020-10-02 ENCOUNTER — Encounter: Payer: Self-pay | Admitting: Orthopaedic Surgery

## 2020-10-02 ENCOUNTER — Ambulatory Visit (INDEPENDENT_AMBULATORY_CARE_PROVIDER_SITE_OTHER): Payer: No Typology Code available for payment source

## 2020-10-02 VITALS — Ht 69.0 in | Wt 192.0 lb

## 2020-10-02 DIAGNOSIS — M1612 Unilateral primary osteoarthritis, left hip: Secondary | ICD-10-CM

## 2020-10-02 DIAGNOSIS — M25552 Pain in left hip: Secondary | ICD-10-CM | POA: Diagnosis not present

## 2020-10-02 MED ORDER — TRAMADOL HCL 50 MG PO TABS
50.0000 mg | ORAL_TABLET | Freq: Three times a day (TID) | ORAL | 0 refills | Status: DC | PRN
Start: 2020-10-02 — End: 2020-10-15

## 2020-10-02 NOTE — Progress Notes (Signed)
Office Visit Note   Patient: Antonio Nash           Date of Birth: July 17, 1956           MRN: [id number] Visit Date: 10/02/2020              Requested by: No referring provider defined for this encounter. PCP: Antonio Nash   Assessment & Plan: Visit Diagnoses:  1. Primary osteoarthritis of left hip     Plan: Impression is advanced degenerative joint disease to the left hip and discitis at T5-T6.  In regards to his left hip, we did discuss treatment options for the future to include cortisone injection as well as surgical intervention.  Due to his current situation with the discitis we do not feel comfortable referring him for cortisone injection.  He is interested in a hip replacement in the future so we have also discussed the need for the lab work from his infection to be within normal limits and for him to be cleared by infectious disease before proceeding.  He will follow up with [place] at this point for further discussion.  Total face to face encounter time was greater than 45 minutes and over half of this time was spent in counseling and/or coordination of care.  Follow-Up Instructions: Return if symptoms worsen or fail to improve.   Orders:  Orders Placed This Encounter  Procedures  . XR HIP UNILAT W OR W/O PELVIS 2-3 VIEWS LEFT  . XR Lumbar Spine 2-3 Views   Meds ordered this encounter  Medications  . traMADol (ULTRAM) 50 MG tablet    Sig: Take 1 tablet (50 mg total) by mouth 3 (three) times daily as needed.    Dispense:  30 tablet    Refill:  0      Procedures: No procedures performed   Clinical Data: No additional findings.   Subjective: Chief Complaint  Patient presents with  . Left Hip - Pain    HPI patient is a pleasant 65 year old gentleman who comes in today with left hip pain.  He has been dealing with this for a while now where he has been seen by the [place].  He most recently has been dealing with discitis T5-T6 from a UTI.  He currently has a  PICC line and is getting IV antibiotics.  He is being followed by Dr. Luciana Nash of infectious disease.  The pain he is currently having is primarily to the lateral hip and occasionally down the left lower leg.  He is also complaining of pain to the mid thoracic spine.  He is taking tramadol for this.  No bowel or bladder change or saddle paresthesias.  Review of Systems as detailed in HPI.  All others reviewed and are negative.   Objective: Vital Signs: Ht 5\' 9"  (1.753 m)   Wt 192 lb (87.1 kg)   BMI 28.35 kg/m   Physical Exam well-developed well-nourished gentleman in no acute distress.  Alert and oriented x3.  Ortho Exam back exam shows moderate tenderness along the mid thoracic spine.  He also has mild tenderness to the paraspinous region of the lower lumbar spine.  He has increased pain with lumbar flexion.  No pain with extension.  Positive logroll and FADIR.  Very mild tenderness to the greater trochanter.  No focal weakness.  He is neurovascular intact distally.  Specialty Comments:  No specialty comments available.  Imaging: XR HIP UNILAT W OR W/O PELVIS 2-3 VIEWS LEFT  Result  Date: 10/02/2020 Advanced joint space narrowing consistent with degenerative joint disease  XR Lumbar Spine 2-3 Views  Result Date: 10/02/2020 Degenerative changes L4-5 and L5-S1    PMFS History: Patient Active Problem List   Diagnosis Date Noted  . Discitis of thoracolumbar region 09/19/2020  . PICC (peripherally inserted central catheter) in place 09/19/2020  . Medication monitoring encounter 09/19/2020  . Osteomyelitis (HCC) 08/31/2020  . HTN (hypertension) 08/31/2020  . Type 2 diabetes mellitus without complication (HCC) 08/31/2020   Past Medical History:  Diagnosis Date  . Diabetes mellitus without complication (HCC)   . Hypertension     History reviewed. No pertinent family history.  History reviewed. No pertinent surgical history. Social History   Occupational History  . Not on file   Tobacco Use  . Smoking status: Never Smoker  . Smokeless tobacco: Never Used  Substance and Sexual Activity  . Alcohol use: Not Currently  . Drug use: Never  . Sexual activity: Not on file

## 2020-10-03 ENCOUNTER — Telehealth: Payer: Self-pay

## 2020-10-03 NOTE — Telephone Encounter (Signed)
[employer] for short term disability requesting attending provider statement form completion.   Copy of forms placed in triage. One placed in MD box in triage.  Valarie Cones

## 2020-10-05 ENCOUNTER — Telehealth: Payer: Self-pay | Admitting: Infectious Diseases

## 2020-10-05 NOTE — Telephone Encounter (Signed)
Have not seen any labs come in for patient from [facility] team. I called [facility] for some assistance in tracking these down.   Spoke with Eunice Blase - They will fax results to our clinic.   Rexene Alberts, NP

## 2020-10-08 ENCOUNTER — Emergency Department (HOSPITAL_COMMUNITY): Payer: No Typology Code available for payment source

## 2020-10-08 ENCOUNTER — Telehealth: Payer: Self-pay

## 2020-10-08 ENCOUNTER — Other Ambulatory Visit: Payer: Self-pay

## 2020-10-08 ENCOUNTER — Inpatient Hospital Stay (HOSPITAL_COMMUNITY)
Admission: EM | Admit: 2020-10-08 | Discharge: 2020-10-15 | DRG: 314 | Disposition: A | Discharge status: Home / Self Care | Payer: No Typology Code available for payment source | Attending: Internal Medicine | Admitting: Internal Medicine

## 2020-10-08 ENCOUNTER — Encounter (HOSPITAL_COMMUNITY): Payer: Self-pay | Admitting: Emergency Medicine

## 2020-10-08 DIAGNOSIS — R509 Fever, unspecified: Secondary | ICD-10-CM

## 2020-10-08 DIAGNOSIS — I2699 Other pulmonary embolism without acute cor pulmonale: Secondary | ICD-10-CM | POA: Diagnosis present

## 2020-10-08 DIAGNOSIS — E119 Type 2 diabetes mellitus without complications: Secondary | ICD-10-CM | POA: Diagnosis not present

## 2020-10-08 DIAGNOSIS — E222 Syndrome of inappropriate secretion of antidiuretic hormone: Secondary | ICD-10-CM | POA: Diagnosis present

## 2020-10-08 DIAGNOSIS — I82A11 Acute embolism and thrombosis of right axillary vein: Secondary | ICD-10-CM | POA: Diagnosis present

## 2020-10-08 DIAGNOSIS — Z79899 Other long term (current) drug therapy: Secondary | ICD-10-CM

## 2020-10-08 DIAGNOSIS — Y848 Other medical procedures as the cause of abnormal reaction of the patient, or of later complication, without mention of misadventure at the time of the procedure: Secondary | ICD-10-CM | POA: Diagnosis present

## 2020-10-08 DIAGNOSIS — Z7901 Long term (current) use of anticoagulants: Secondary | ICD-10-CM | POA: Diagnosis not present

## 2020-10-08 DIAGNOSIS — G9519 Other vascular myelopathies: Secondary | ICD-10-CM | POA: Diagnosis not present

## 2020-10-08 DIAGNOSIS — Z452 Encounter for adjustment and management of vascular access device: Secondary | ICD-10-CM

## 2020-10-08 DIAGNOSIS — Z87891 Personal history of nicotine dependence: Secondary | ICD-10-CM

## 2020-10-08 DIAGNOSIS — Z6826 Body mass index (BMI) 26.0-26.9, adult: Secondary | ICD-10-CM | POA: Diagnosis not present

## 2020-10-08 DIAGNOSIS — R042 Hemoptysis: Secondary | ICD-10-CM | POA: Diagnosis not present

## 2020-10-08 DIAGNOSIS — Z7984 Long term (current) use of oral hypoglycemic drugs: Secondary | ICD-10-CM

## 2020-10-08 DIAGNOSIS — I2609 Other pulmonary embolism with acute cor pulmonale: Secondary | ICD-10-CM | POA: Diagnosis not present

## 2020-10-08 DIAGNOSIS — Z888 Allergy status to other drugs, medicaments and biological substances status: Secondary | ICD-10-CM

## 2020-10-08 DIAGNOSIS — T82868A Thrombosis of vascular prosthetic devices, implants and grafts, initial encounter: Principal | ICD-10-CM | POA: Diagnosis present

## 2020-10-08 DIAGNOSIS — M4644 Discitis, unspecified, thoracic region: Secondary | ICD-10-CM

## 2020-10-08 DIAGNOSIS — M40204 Unspecified kyphosis, thoracic region: Secondary | ICD-10-CM | POA: Diagnosis not present

## 2020-10-08 DIAGNOSIS — G9589 Other specified diseases of spinal cord: Secondary | ICD-10-CM | POA: Diagnosis not present

## 2020-10-08 DIAGNOSIS — E1169 Type 2 diabetes mellitus with other specified complication: Secondary | ICD-10-CM | POA: Diagnosis present

## 2020-10-08 DIAGNOSIS — M4624 Osteomyelitis of vertebra, thoracic region: Secondary | ICD-10-CM | POA: Diagnosis present

## 2020-10-08 DIAGNOSIS — I10 Essential (primary) hypertension: Secondary | ICD-10-CM | POA: Diagnosis present

## 2020-10-08 DIAGNOSIS — Z20822 Contact with and (suspected) exposure to covid-19: Secondary | ICD-10-CM | POA: Diagnosis present

## 2020-10-08 DIAGNOSIS — J9811 Atelectasis: Secondary | ICD-10-CM | POA: Diagnosis not present

## 2020-10-08 DIAGNOSIS — J181 Lobar pneumonia, unspecified organism: Secondary | ICD-10-CM | POA: Diagnosis present

## 2020-10-08 DIAGNOSIS — I82611 Acute embolism and thrombosis of superficial veins of right upper extremity: Secondary | ICD-10-CM | POA: Diagnosis present

## 2020-10-08 DIAGNOSIS — I82409 Acute embolism and thrombosis of unspecified deep veins of unspecified lower extremity: Secondary | ICD-10-CM | POA: Diagnosis present

## 2020-10-08 DIAGNOSIS — M869 Osteomyelitis, unspecified: Secondary | ICD-10-CM | POA: Diagnosis present

## 2020-10-08 DIAGNOSIS — R634 Abnormal weight loss: Secondary | ICD-10-CM | POA: Diagnosis present

## 2020-10-08 DIAGNOSIS — M4649 Discitis, unspecified, multiple sites in spine: Secondary | ICD-10-CM | POA: Diagnosis present

## 2020-10-08 DIAGNOSIS — I2693 Single subsegmental pulmonary embolism without acute cor pulmonale: Secondary | ICD-10-CM | POA: Diagnosis not present

## 2020-10-08 DIAGNOSIS — M4645 Discitis, unspecified, thoracolumbar region: Secondary | ICD-10-CM | POA: Diagnosis present

## 2020-10-08 DIAGNOSIS — Z885 Allergy status to narcotic agent status: Secondary | ICD-10-CM | POA: Diagnosis not present

## 2020-10-08 LAB — CBC WITH DIFFERENTIAL/PLATELET
Abs Immature Granulocytes: 0.04 10*3/uL (ref 0.00–0.07)
Basophils Absolute: 0 10*3/uL (ref 0.0–0.1)
Basophils Relative: 0 %
Eosinophils Absolute: 0 10*3/uL (ref 0.0–0.5)
Eosinophils Relative: 0 %
HCT: 35.7 % — ABNORMAL LOW (ref 39.0–52.0)
Hemoglobin: 10.8 g/dL — ABNORMAL LOW (ref 13.0–17.0)
Immature Granulocytes: 0 %
Lymphocytes Relative: 13 %
Lymphs Abs: 1.6 10*3/uL (ref 0.7–4.0)
MCH: 25.8 pg — ABNORMAL LOW (ref 26.0–34.0)
MCHC: 30.3 g/dL (ref 30.0–36.0)
MCV: 85.2 fL (ref 80.0–100.0)
Monocytes Absolute: 1.3 10*3/uL — ABNORMAL HIGH (ref 0.1–1.0)
Monocytes Relative: 10 %
Neutro Abs: 9.5 10*3/uL — ABNORMAL HIGH (ref 1.7–7.7)
Neutrophils Relative %: 77 %
Platelets: 489 10*3/uL — ABNORMAL HIGH (ref 150–400)
RBC: 4.19 MIL/uL — ABNORMAL LOW (ref 4.22–5.81)
RDW: 14 % (ref 11.5–15.5)
WBC: 12.5 10*3/uL — ABNORMAL HIGH (ref 4.0–10.5)
nRBC: 0 % (ref 0.0–0.2)

## 2020-10-08 LAB — URINALYSIS, ROUTINE W REFLEX MICROSCOPIC
Bilirubin Urine: NEGATIVE
Glucose, UA: >=500 mg/dL — AB
Ketones, ur: NEGATIVE mg/dL
Leukocytes,Ua: NEGATIVE
Nitrite: NEGATIVE
Protein, ur: 30 mg/dL — AB
Specific Gravity, Urine: >1.046 — ABNORMAL HIGH (ref 1.005–1.030)
pH: 5 (ref 5.0–8.0)

## 2020-10-08 LAB — COMPREHENSIVE METABOLIC PANEL
ALT: 42 U/L (ref 0–44)
AST: 32 U/L (ref 15–41)
Albumin: 3 g/dL — ABNORMAL LOW (ref 3.5–5.0)
Alkaline Phosphatase: 42 U/L (ref 38–126)
Anion gap: 13 (ref 5–15)
BUN: 24 mg/dL — ABNORMAL HIGH (ref 8–23)
CO2: 23 mmol/L (ref 22–32)
Calcium: 8.8 mg/dL — ABNORMAL LOW (ref 8.9–10.3)
Chloride: 96 mmol/L — ABNORMAL LOW (ref 98–111)
Creatinine, Ser: 1.37 mg/dL — ABNORMAL HIGH (ref 0.61–1.24)
GFR, Estimated: 57 mL/min — ABNORMAL LOW (ref >60)
Glucose, Bld: 112 mg/dL — ABNORMAL HIGH (ref 70–99)
Potassium: 4.2 mmol/L (ref 3.5–5.1)
Sodium: 132 mmol/L — ABNORMAL LOW (ref 135–145)
Total Bilirubin: 0.8 mg/dL (ref 0.3–1.2)
Total Protein: 7.9 g/dL (ref 6.5–8.1)

## 2020-10-08 LAB — RESP PANEL BY RT-PCR (FLU A&B, COVID) ARPGX2
Influenza A by PCR: NEGATIVE
Influenza B by PCR: NEGATIVE
SARS Coronavirus 2 by RT PCR: NEGATIVE

## 2020-10-08 LAB — GLUCOSE, CAPILLARY
Glucose-Capillary: 45 mg/dL — ABNORMAL LOW (ref 70–99)
Glucose-Capillary: 113 mg/dL — ABNORMAL HIGH (ref 70–99)
Glucose-Capillary: 68 mg/dL — ABNORMAL LOW (ref 70–99)

## 2020-10-08 LAB — SEDIMENTATION RATE: Sed Rate: 63 mm/hr — ABNORMAL HIGH (ref 0–16)

## 2020-10-08 LAB — LACTIC ACID, PLASMA
Lactic Acid, Venous: 2.5 mmol/L (ref 0.5–1.9)
Lactic Acid, Venous: 0.7 mmol/L (ref 0.5–1.9)

## 2020-10-08 LAB — PROTIME-INR
INR: 1.2 (ref 0.8–1.2)
Prothrombin Time: 15.1 seconds (ref 11.4–15.2)

## 2020-10-08 MED ORDER — SODIUM CHLORIDE 0.9 % IV BOLUS
1000.0000 mL | Freq: Once | INTRAVENOUS | Status: AC
  Administered 2020-10-08: 1000 mL via INTRAVENOUS

## 2020-10-08 MED ORDER — METHOCARBAMOL 500 MG PO TABS: 500.0000 mg | ORAL_TABLET | Freq: Four times a day (QID) | ORAL | Status: DC | PRN

## 2020-10-08 MED ORDER — INSULIN ASPART 100 UNIT/ML ~~LOC~~ SOLN: 0.0000 [IU] | Freq: Three times a day (TID) | SUBCUTANEOUS | Status: DC

## 2020-10-08 MED ORDER — TRAMADOL HCL 50 MG PO TABS
50.0000 mg | ORAL_TABLET | Freq: Once | ORAL | Status: AC
  Administered 2020-10-08: 50 mg via ORAL
  Filled 2020-10-08: qty 1

## 2020-10-08 MED ORDER — ACETAMINOPHEN 650 MG RE SUPP: 650.0000 mg | Freq: Four times a day (QID) | RECTAL | Status: DC | PRN

## 2020-10-08 MED ORDER — HEPARIN (PORCINE) 25000 UT/250ML-% IV SOLN
2400.0000 [IU]/h | INTRAVENOUS | Status: DC
  Administered 2020-10-08: 1400 [IU]/h via INTRAVENOUS
  Administered 2020-10-09: 1600 [IU]/h via INTRAVENOUS
  Administered 2020-10-09: 1850 [IU]/h via INTRAVENOUS
  Administered 2020-10-10: 2100 [IU]/h via INTRAVENOUS
  Filled 2020-10-08 (×5): qty 250

## 2020-10-08 MED ORDER — LISINOPRIL 40 MG PO TABS
40.0000 mg | ORAL_TABLET | Freq: Every day | ORAL | Status: DC
  Administered 2020-10-09: 40 mg via ORAL
  Filled 2020-10-08: qty 1

## 2020-10-08 MED ORDER — SODIUM CHLORIDE 0.9% FLUSH
10.0000 mL | INTRAVENOUS | Status: DC | PRN
  Administered 2020-10-11: 10 mL

## 2020-10-08 MED ORDER — SODIUM CHLORIDE 0.9 % IV SOLN
750.0000 mg | Freq: Every day | INTRAVENOUS | Status: DC
  Administered 2020-10-08 – 2020-10-11 (×4): 750 mg via INTRAVENOUS
  Filled 2020-10-08 (×5): qty 15

## 2020-10-08 MED ORDER — POLYETHYLENE GLYCOL 3350 17 G PO PACK: 17.0000 g | PACK | Freq: Every day | ORAL | Status: DC | PRN

## 2020-10-08 MED ORDER — IOHEXOL 350 MG/ML SOLN
50.0000 mL | Freq: Once | INTRAVENOUS | Status: AC | PRN
  Administered 2020-10-08: 50 mL via INTRAVENOUS

## 2020-10-08 MED ORDER — HEPARIN BOLUS VIA INFUSION
5500.0000 [IU] | Freq: Once | INTRAVENOUS | Status: AC
  Administered 2020-10-08: 5500 [IU] via INTRAVENOUS
  Filled 2020-10-08: qty 5500

## 2020-10-08 MED ORDER — ONDANSETRON HCL 4 MG/2ML IJ SOLN: 4.0000 mg | Freq: Four times a day (QID) | INTRAMUSCULAR | Status: DC | PRN

## 2020-10-08 MED ORDER — AMLODIPINE BESYLATE 10 MG PO TABS
10.0000 mg | ORAL_TABLET | Freq: Every day | ORAL | Status: DC
  Administered 2020-10-09 – 2020-10-15 (×7): 10 mg via ORAL
  Filled 2020-10-08 (×8): qty 1

## 2020-10-08 MED ORDER — ONDANSETRON HCL 4 MG PO TABS: 4.0000 mg | ORAL_TABLET | Freq: Four times a day (QID) | ORAL | Status: DC | PRN

## 2020-10-08 MED ORDER — TRAMADOL HCL 50 MG PO TABS
50.0000 mg | ORAL_TABLET | Freq: Three times a day (TID) | ORAL | Status: DC | PRN
  Administered 2020-10-09 – 2020-10-11 (×5): 50 mg via ORAL
  Filled 2020-10-08 (×5): qty 1

## 2020-10-08 MED ORDER — SODIUM CHLORIDE 0.9 % IV SOLN
2.0000 g | INTRAVENOUS | Status: AC
  Administered 2020-10-08 – 2020-10-12 (×5): 2 g via INTRAVENOUS
  Filled 2020-10-08 (×2): qty 2
  Filled 2020-10-08: qty 20
  Filled 2020-10-08 (×2): qty 2

## 2020-10-08 MED ORDER — CARVEDILOL 12.5 MG PO TABS
12.5000 mg | ORAL_TABLET | Freq: Every day | ORAL | Status: DC
  Administered 2020-10-09 – 2020-10-15 (×7): 12.5 mg via ORAL
  Filled 2020-10-08 (×7): qty 1

## 2020-10-08 MED ORDER — DAPTOMYCIN IV (FOR PTA / DISCHARGE USE ONLY): 750.0000 mg | INTRAVENOUS | Status: DC

## 2020-10-08 MED ORDER — CEFTRIAXONE SODIUM IN DEXTROSE 40 MG/ML IV SOLN
2.0000 g | INTRAVENOUS | Status: DC
  Filled 2020-10-08 (×2): qty 50

## 2020-10-08 MED ORDER — ACETAMINOPHEN 325 MG PO TABS
650.0000 mg | ORAL_TABLET | Freq: Four times a day (QID) | ORAL | Status: DC | PRN
  Administered 2020-10-08 – 2020-10-14 (×12): 650 mg via ORAL
  Filled 2020-10-08 (×12): qty 2

## 2020-10-08 MED ORDER — CHLORHEXIDINE GLUCONATE CLOTH 2 % EX PADS
6.0000 | MEDICATED_PAD | Freq: Every day | CUTANEOUS | Status: DC
  Administered 2020-10-08 – 2020-10-11 (×4): 6 via TOPICAL

## 2020-10-08 NOTE — Progress Notes (Signed)
   10/08/20 1906  Assess: MEWS Score  Temp (!) 101.4 F (38.6 C)  BP 137/71  Pulse Rate (!) 102  Resp 18  SpO2 94 %  O2 Device Nasal Cannula  O2 Flow Rate (L/min) 2 L/min  Assess: MEWS Score  MEWS Temp 1  MEWS Systolic 0  MEWS Pulse 1  MEWS RR 0  MEWS LOC 0  MEWS Score 2  MEWS Score Color Yellow  Assess: if the MEWS score is Yellow or Red  Were vital signs taken at a resting state? Yes  Focused Assessment Change from prior assessment (see assessment flowsheet)  Early Detection of Sepsis Score *See Row Information* Low  MEWS guidelines implemented *See Row Information* Yes  Treat  Pain Scale 0-10  Pain Score 2  Pain Location Back  Pain Intervention(s) Repositioned  Take Vital Signs  Increase Vital Sign Frequency  Yellow: Q 2hr X 2 then Q 4hr X 2, if remains yellow, continue Q 4hrs  Escalate  MEWS: Escalate Yellow: discuss with charge nurse/RN and consider discussing with provider and RRT  Notify: Charge Nurse/RN  Name of Charge Nurse/RN Notified LourdesA., RN  Date Charge Nurse/RN Notified 10/08/20  Time Charge Nurse/RN Notified 1915  Notify: Provider  Date Provider Notified 10/08/20  Time Provider Notified 2000  Notification Type Page  Notification Reason Change in status  Provider response No new orders (administered PRN Tylenol for elevated temp.)  Date of Provider Response 10/08/20  Time of Provider Response 2015  Document  Patient Outcome Stabilized after interventions (Green MEWS at 2322)

## 2020-10-08 NOTE — Progress Notes (Addendum)
At 2216, CBG=45, patient is asymptomatic and had not eaten dinner in ED d/t late admit, gave 8 oz orange juice per hypoglycemic protocol and recheck at 2252, CBG=68, gave another 6 oz of orange juice and 2 packs graham crackers, recheck CBG at 2327, CBG=113.   On call IMTS intern was made aware and called back.  No new order.  Patient's CBG within normal range now and is resting comfortably.

## 2020-10-08 NOTE — Progress Notes (Signed)
Pt to 6N02 from ED via stretcher, cardiac monitoring placed and verified by 2. O2 at 2L [place]. Heparin drip at 14 placed on pump.  Temp is 101.4.

## 2020-10-08 NOTE — ED Triage Notes (Signed)
Pt arrives to ED with chief complaint of coughing up blood that started on Sunday, he states he has had a chronic cough but Sunday was the first time he had blood tinged sputum. He also had a fever last night. He currently has a picc line and reports he is being treated for discitis.

## 2020-10-08 NOTE — ED Provider Notes (Signed)
[facility] EMERGENCY DEPARTMENT Provider Note   CSN: [id number] Arrival date & time: 10/08/20  1037     History Chief Complaint  Patient presents with  . Hemoptysis    Antonio Nash is a 65 y.o. male.  HPI   65 year old male with past medical history of HTN and DM presents to the emergency department concern for hemoptysis.  Of note patient currently has a PICC line in place and is being treated with outpatient IV antibiotics for concern of osteomyelitis/discitis of T5/T6.  He was discharged about a month ago after a stay where he had CT-guided biopsy, cultures and treatment.  He is currently following as an outpatient with infectious disease Dr. Linus Salmons.  Patient is accompanied by his wife.  They states starting 2 days ago he began to have blood-tinged sputum that transitioned to coughing up pure blood with a low-grade fever despite keeping him on track with his Tylenol/tramadol.  Patient endorses that he feels increasingly fatigued with a decreased appetite.  He is fully vaccinated for Covid.  He denies any vomiting or diarrhea.  At this time has no chest pain but admits that his back pain has been increasing to the level of when he was originally admitted for the possible infection.  He has been compliant with his IV antibiotics.  Past Medical History:  Diagnosis Date  . Diabetes mellitus without complication ([place])   . Hypertension     Patient Active Problem List   Diagnosis Date Noted  . Primary osteoarthritis of left hip 10/02/2020  . Discitis of thoracolumbar region 09/19/2020  . PICC (peripherally inserted central catheter) in place 09/19/2020  . Medication monitoring encounter 09/19/2020  . Osteomyelitis ([place]) 08/31/2020  . HTN (hypertension) 08/31/2020  . Type 2 diabetes mellitus without complication ([place]) 47/42/5956    History reviewed. No pertinent surgical history.     No family history on file.  Social History   Tobacco Use  . Smoking  status: Never Smoker  . Smokeless tobacco: Never Used  Substance Use Topics  . Alcohol use: Not Currently  . Drug use: Never    Home Medications Prior to Admission medications   Medication Sig Start Date End Date Taking? Authorizing Provider  amLODipine (NORVASC) 10 MG tablet     [provider]  carvedilol (COREG) 12.5 MG tablet Take 12.5 mg by mouth in the morning and at bedtime. 08/03/17   [provider]  cefTRIAXone (ROCEPHIN) IVPB Inject 2 g into the vein daily. Indication:  Ostemyelitis First Dose: No Last Day of Therapy:  10/12/2020 Labs - Once weekly:  CBC/D and BMP, Labs - Every other week:  ESR and CRP Method of administration: IV Push Method of administration may be changed at the discretion of home infusion pharmacist based upon assessment of the patient and/or caregiver's ability to self-administer the medication ordered. 09/03/20 10/13/20  Aline August, MD  daptomycin (CUBICIN) IVPB Inject 750 mg into the vein daily. Indication:  Osteomyelitis  First Dose: no  Last Day of Therapy:  10/12/2020 Labs - Once weekly:  CBC/D, BMP, and CPK Labs - Every other week:  ESR and CRP Method of administration: IV Push Method of administration may be changed at the discretion of home infusion pharmacist based upon assessment of the patient and/or caregiver's ability to self-administer the medication ordered. Patient taking differently: Inject 750 mg into the vein daily. Indication:  Osteomyelitis  First Dose: no  Last Day of Therapy:  10/12/2020 Labs - Once weekly:  CBC/D, BMP, and CPK Labs - Every other week:  ESR and CRP Method of administration: IV Push Method of administration may be changed at the discretion of home infusion pharmacist based upon assessment of the patient and/or caregiver's ability to self-administer the medication ordered. 09/04/20 10/12/20  Aline August, MD  empagliflozin (JARDIANCE) 25 MG TABS tablet     [provider]  glimepiride  (AMARYL) 4 MG tablet Take 4 mg by mouth daily with breakfast.    [provider]  lisinopril (ZESTRIL) 40 MG tablet Take 1 tablet by mouth daily. 08/03/17   [provider]  traMADol (ULTRAM) 50 MG tablet Take 1 tablet (50 mg total) by mouth 3 (three) times daily as needed. 10/02/20   Aundra Dubin, PA-C    Allergies    Codeine  Review of Systems   Review of Systems  Constitutional: Positive for appetite change, chills, fatigue and fever.  HENT: Negative for congestion.   Eyes: Negative for visual disturbance.  Respiratory: Positive for cough. Negative for shortness of breath.        + Hemoptysis  Cardiovascular: Negative for chest pain, palpitations and leg swelling.  Gastrointestinal: Negative for abdominal pain, diarrhea and vomiting.  Genitourinary: Negative for dysuria.  Musculoskeletal: Positive for back pain. Negative for neck pain.  Skin: Negative for rash.  Neurological: Positive for headaches.    Physical Exam Updated Vital Signs BP (!) 109/55 (BP Location: Left Arm)   Pulse 95   Temp 99.3 F (37.4 C)   Resp (!) 21   Ht _0  (1.753 m)   Wt 82.1 kg   SpO2 95%   BMI 26.73 kg/m   Physical Exam Vitals and nursing note reviewed.  Constitutional:      Appearance: Normal appearance.     Comments: Fatigued appearing but nontoxic  HENT:     Head: Normocephalic.     Mouth/Throat:     Mouth: Mucous membranes are moist.  Cardiovascular:     Rate and Rhythm: Normal rate.  Pulmonary:     Effort: Pulmonary effort is normal. No respiratory distress.     Breath sounds: Rales present. No wheezing.  Abdominal:     Palpations: Abdomen is soft.     Tenderness: There is no abdominal tenderness.  Musculoskeletal:        General: No swelling.  Skin:    General: Skin is warm.  Neurological:     Mental Status: He is alert and oriented to person, place, and time. Mental status is at baseline.  Psychiatric:        Mood and Affect: Mood normal.     ED  Results / Procedures / Treatments   Labs (all labs ordered are listed, but only abnormal results are displayed) Labs Reviewed  CBC WITH DIFFERENTIAL/PLATELET - Abnormal; Notable for the following components:      Result Value   WBC 12.5 (*)    RBC 4.19 (*)    Hemoglobin 10.8 (*)    HCT 35.7 (*)    MCH 25.8 (*)    Platelets 489 (*)    Neutro Abs 9.5 (*)    Monocytes Absolute 1.3 (*)    All other components within normal limits  LACTIC ACID, PLASMA  LACTIC ACID, PLASMA  COMPREHENSIVE METABOLIC PANEL  URINALYSIS, ROUTINE W REFLEX MICROSCOPIC    EKG EKG Interpretation  Date/Time:  Monday October 08 2020 10:44:51 EST Ventricular Rate:  97 PR Interval:  126 QRS Duration: 84 QT Interval:  348 QTC  Calculation: 441 R Axis:   17 Text Interpretation: Normal sinus rhythm T wave abnormality, consider inferior ischemia Abnormal ECG NSR , no change from previous Confirmed by Lavenia Atlas [phone]) on 10/08/2020 11:43:08 AM   Radiology No results found.  Procedures Procedures   Medications Ordered in ED Medications - No data to display  ED Course  I have reviewed the triage vital signs and the nursing notes.  Pertinent labs & imaging results that were available during my care of the patient were reviewed by me and considered in my medical decision making (see chart for details).    MDM Rules/Calculators/A&P                          65 year old male presents the emergency department for concern of hemoptysis.  He is tachypneic on arrival, oxygenation did drop down to 90% on room air, has been placed on 2 L nasal cannula.  He is otherwise comfortable, conversational in no respiratory distress.  Blood work shows a mild leukocytosis, his white count was normal and he was discharged from the hospital.  My main concern would be for pulmonary embolism.  CT PE study shows right-sided pulmonary embolism and possible left.  There is also question of developing pneumonia versus pulmonary  infarct.  Patient is afebrile, he is already on IV antibiotics for the discitis, will defer antibiotics to admitting team/pharmacy.  Heparin has been ordered per pharmacy.  Initial lactic was slightly elevated, this is down trended normal.  Covid is negative.  Patients evaluation and results requires admission for further treatment and care. Patient agrees with admission plan, offers no new complaints and is stable/unchanged at time of admit.  Final Clinical Impression(s) / ED Diagnoses Final diagnoses:  None    Rx / DC Orders ED Discharge Orders    None       Lorelle Gibbs, DO 10/08/20 1533

## 2020-10-08 NOTE — ED Notes (Signed)
RN requested heparin from pharmacy

## 2020-10-08 NOTE — Progress Notes (Signed)
ANTICOAGULATION CONSULT NOTE  Pharmacy Consult for Heparin Indication: pulmonary embolus  Allergies  Allergen Reactions  . Codeine Itching    Other reaction(s): Other (See Comments) Skin crawls  . Metformin Diarrhea    Patient Measurements: Height: 5\' 9"  (175.3 cm) Weight: 82.1 kg (181 lb) IBW/kg (Calculated) : 70.7 Heparin Dosing Weight: 82.1 kg  Vital Signs: Temp: 99.3 F (37.4 C) (02/21 1105) Temp Source: Oral (02/21 1042) BP: 118/68 (02/21 1445) Pulse Rate: 99 (02/21 1445)  Labs: Recent Labs    10/08/20 1059 10/08/20 1237  HGB 10.8*  --   HCT 35.7*  --   PLT 489*  --   LABPROT  --  15.1  INR  --  1.2  CREATININE 1.37*  --     Estimated Creatinine Clearance: 53 mL/min (A) (by C-G formula based on SCr of 1.37 mg/dL (H)).   Medical History: Past Medical History:  Diagnosis Date  . Diabetes mellitus without complication ([facility])   . Hypertension     Medications:  Scheduled:  . heparin  5,500 Units Intravenous Once    Assessment: Patient is a 20 yom that presents to the ED with hemoptysis. 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 CT was preformed and showed concerns for covid as well as confirmed a PE. Pharmacy has been asked to dose heparin at this time for this patient PE.   Goal of Therapy:  Heparin level 0.3-0.7 units/ml Monitor platelets by anticoagulation protocol: Yes   Plan:  - Heparin bolus 5500 units IV x 1 dose - Heparin drip @ 1400 units/hr - Heparin level in ~ 8 hours  - Monitor patient for s/s of bleeding and CBC while on heparin   71 PharmD. BCPS  10/08/2020,3:07 PM

## 2020-10-08 NOTE — ED Notes (Addendum)
Patient transported to X-ray 

## 2020-10-08 NOTE — ED Notes (Signed)
RN requested medication from pharmacy  

## 2020-10-08 NOTE — H&P (Cosign Needed Addendum)
Date: 10/08/2020               Patient Name:  Antonio Nash MRN: [id number]  DOB: 11/15/1955 Age / Sex: 65 y.o., male   PCP: Patient, No Pcp Per         Medical Service: Internal Medicine Teaching Service         Attending Physician: Dr. Rebeca Alert, Raynaldo Opitz, MD    First Contact: Dr. Lisabeth Devoid Pager: [phone]  Second Contact: Dr. 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 He reports pain with deep inhalation as well as feeling very short of breath. He coughed up blood yesterday. He has had a cough for about a week. He was coughing up clear phlegm initially, then yellow then bloody. Wife felt he has been more SOB for the past week and attributed it to pain and treating him with tramadol and tylenol. Notes he was febrile yesterday be has not had a fever since then.  He has had numerous ED visits recently due to his back pain. He was diagnosed with a back infection and on IV antibiotics for this. Initially this was getting better but in the last couple days back pain has worsened. He was Admitted between 08/31/2020 and 09/03/2020 for osteomyelitis and discitis of t5-t6 region. Started on IV antibiotics with PICC line until 2/25. Wife states he had a E, coli UTI in September and took antibiotics. They think that is where his back infection came from. Has been laying more and avoiding getting up recently due to worsening back pain.  Denies chest pain, abdominal pain, dizziness, lightheadedness, n/v. No fevers today. Wife has been giving him tylenol. No swelling in his legs, calf tenderness, no recent travel.  Denies any fatigue. Endorses lower back pain. No history of blood clots. No history of cancers.  ED Course: Patient tachyoniec on arrival with O2 sat in the 87% placed on 3L [place]. Leukocytosis of 12.5, hgb 10.8,  Lactic acid of 2.5>0.7 CT PE with acute pulmonary emboli of the right, and possible left subsegmental  involvement borderline right heart strain. Bilateral peripheral consolidation and atelectasis, osteomyelitis at T5-T6.  Started on heparin COVID negative.   Meds:  Current Meds  Medication Sig  . acetaminophen (TYLENOL) 500 MG tablet Take 500-1,000 mg by mouth every 6 (six) hours as needed for mild pain.  Marland Kitchen amLODipine (NORVASC) 10 MG tablet Take 10 mg by mouth daily.  . carvedilol (COREG) 12.5 MG tablet Take 12.5 mg by mouth daily.  . cefTRIAXone (ROCEPHIN) IVPB Inject 2 g into the vein daily. Indication:  Ostemyelitis First Dose: No Last Day of Therapy:  10/12/2020 Labs - Once weekly:  CBC/D and BMP, Labs - Every other week:  ESR and CRP Method of administration: IV Push Method of administration may be changed at the discretion of home infusion pharmacist based upon assessment of the patient and/or caregiver's ability to self-administer the medication ordered.  . daptomycin (CUBICIN) IVPB Inject 750 mg into the vein daily. Indication:  Osteomyelitis  First Dose: no  Last Day of Therapy:  10/12/2020 Labs -  Once weekly:  CBC/D, BMP, and CPK Labs - Every other week:  ESR and CRP Method of administration: IV Push Method of administration may be changed at the discretion of home infusion pharmacist based upon assessment of the patient and/or caregiver's ability to self-administer the medication ordered. (Patient taking differently: Inject 750 mg into the vein daily. Indication:  Osteomyelitis  First Dose: no  Last Day of Therapy:  10/12/2020 Labs - Once weekly:  CBC/D, BMP, and CPK Labs - Every other week:  ESR and CRP Method of administration: IV Push Method of administration may be  changed at the discretion of home infusion pharmacist based upon assessment of the patient and/or caregiver's ability to self-administer the medication ordered.)  . diphenhydrAMINE (BENADRYL) 25 MG tablet Take 25 mg by mouth every 6 (six) hours as needed for allergies.  Marland Kitchen empagliflozin (JARDIANCE) 25 MG TABS tablet Take 25 mg by mouth daily.  Marland Kitchen glimepiride (AMARYL) 4 MG tablet Take 4 mg by mouth daily with breakfast.  . lisinopril (ZESTRIL) 40 MG tablet Take 40 mg by mouth daily.  . methocarbamol (ROBAXIN) 500 MG tablet Take 500 mg by mouth every 6 (six) hours as needed for muscle spasms.  . traMADol (ULTRAM) 50 MG tablet Take 1 tablet (50 mg total) by mouth 3 (three) times daily as needed. (Patient taking differently: Take 50 mg by mouth 3 (three) times daily as needed for moderate pain.)    Allergies: Allergies as of 10/08/2020 - Review Complete 10/08/2020  Allergen Reaction Noted  . Codeine Itching 08/29/2016  . Metformin Diarrhea 09/07/2018   Past Medical History:  Diagnosis Date  . Diabetes mellitus without complication ([place])   . Hypertension     Family History:  His mother had cancer, something male related.   Social History: Used to smoke cigarettes about 10 years ago. Denies alcohol use or other drug use. Works full time as a Furniture conservator/restorer.  Review of Systems: A complete ROS was negative except as per HPI.   Physical Exam: Blood pressure 124/68, pulse (!) 103, temperature 99.3 F (37.4 C), resp. rate 19, height '5\' 9"'  (1.753 m), weight 82.1 kg, SpO2 90 %. Constitutional: Tired appearing, well nourished male, laying comfortably in bed, in no acute distress Eye: EOMI, no icterus Neck: Supple, normal ROM Pulmonary: On 3L [place] sating in low 90s. No increased work of breathing. Rales R> L, no wheezing, Cardiac: tachycardic, normal rhythm, no murmur, gallops, or rubs, no peripheral edema Abdominal: Soft, non tender, normal bowel sounds, no masses Skin: warm, dry, no rashes, R PICC  clean and dressed dressed no erythema or edema  Neurological: Alert and oriented x3, lethargic, easily arousal to voice, sensation grossly intact, strength intact and symmetric, no focal deficits Psych: Normal mood and affect  CT Angio Chest PE W/Cm &/Or Wo Cm IMPRESSION: Acute pulmonary emboli involving right lobar, segmental, and subsegmental branches. Possible left subsegmental involvement. Borderline right heart strain. Bilateral peripheral areas of consolidation and atelectasis. Likely reflects pneumonia (COVID-19 is possible). A component of pulmonary infarction is also possible. Osteomyelitis discitis at T5-T6 with paraspinal soft tissue. Retropulsion of bone into the spinal canal likely effacing the ventral subarachnoid space. This would be better evaluated on MRI. These results were called by telephone at the time of interpretation on 10/08/2020 at 1:48 pm to provider [facility] , who verbally acknowledged these results. Electronically Signed   By: Macy Mis M.D.   On: 10/08/2020 13:57   DG Chest Port 1 View  IMPRESSION: Patchy  bibasilar opacities and probable upper lobe components suspicious for multilobar pneumonia, potentially viral in etiology. Recommend radiographic follow-up to document clearing. Electronically Signed   By: Richardean Sale M.D.   On: 10/08/2020 12:07    Assessment & Plan by Problem: Active Problems:   Pulmonary emboli [facility])  Mr. 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  Pulmonary emboli Fever Leukocytosis Patient presents for worsening dyspnea and pleuritic chest pain for the past week. Also noted hemoptysis and one episode of fever yesterday. Mild leukocytosis, afebrile. Tachypnea much improved on [place]. Tachycardic, BP soft on admission  but now stable in 120s.  CT with right pulmonary emboli with possible left subsegmental involvement. Borderline heart strain. Low risk on simplified PESI. Bilateral peripheral areas of consolidation and atelectasis. Currently on IV antibiotics, could be infection vs pulmonary infarction. Consider blood cultures if he become febrile. Patient apears HD stable at this time will continue on IV anticoagulation and plan to transition to orals tomorrow.  -Continue Heparin gtt -O2 as need to maintain saturation >90% - Monitor on tele   T5-T6 Discitis/Osteomylitis  Patient with worsening back pain. 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  Admitted between 08/31/2020 and 09/03/2020 for this ad a paraspinal biopsy done at that time, cultures negative. Discharged on IV daptomycin and Ceftriaxone until 10/12/2020. Follow on 2/2 with Dr. Linus Salmons and doing well at that time. No neurological deficets at this time. - Continue daptomycin and ceftriaxone - Tramadol 50 mg three time daily  PRN, acetaminophen every 6 hours PRN. - Discuss with ID in the morning regarding repeat imaging  Hypertension -Resume home regimen of Coreg and lisinopril. -Monitor blood pressure  Diabetes mellitus type 2 Appears diet controlled, Last A1c of 7.4 -SSI sensitive   Dispo: Admit patient to Inpatient with expected length of stay greater than 2 midnights.  Signed: Iona Beard, MD 10/08/2020, 6:13 PM  Pager: [phone] After 5pm on weekdays and 1pm on weekends: On Call pager: [phone]

## 2020-10-08 NOTE — Telephone Encounter (Signed)
Received call from patient's wife. Patient started having a fever of 102 yesterday. Patient was given tylenol and patient has not had any fevers since. Patient did complain of back pain and had blood tinged mucus.   Wife reports nurse from [place] home [facility] was scheduled to come to the home today, but the nurse was out sick.   [facility] has not received any labs on patient since discharge.  Call placed to [facility] requesting labs. HH states they have been faxing everything to [place]. And requested a nurse go to the home to check on patient. Per [name] their schedule is "tight" and they may not be able to send out a nurse today.   NO labs received after 10 minutes and LPN consulted with [name], NP. Per [name] advised patient to go to ED/Urgent Care.   LPN called wife and spoke to patient and advised him to go to ED. Patient agrees and verbalized understanding.   Patient states he will go to [facility] ED.  Routing message to inpatient provider to make aware. Valarie Cones

## 2020-10-08 NOTE — ED Notes (Signed)
RN attempted report 

## 2020-10-08 NOTE — Telephone Encounter (Signed)
Agree with need for further evaluation.

## 2020-10-08 NOTE — ED Notes (Signed)
Returned to floor 

## 2020-10-09 ENCOUNTER — Inpatient Hospital Stay (HOSPITAL_COMMUNITY): Payer: No Typology Code available for payment source

## 2020-10-09 ENCOUNTER — Encounter (HOSPITAL_COMMUNITY): Payer: Self-pay | Admitting: Internal Medicine

## 2020-10-09 DIAGNOSIS — R509 Fever, unspecified: Secondary | ICD-10-CM

## 2020-10-09 DIAGNOSIS — M4645 Discitis, unspecified, thoracolumbar region: Secondary | ICD-10-CM | POA: Diagnosis not present

## 2020-10-09 DIAGNOSIS — I2693 Single subsegmental pulmonary embolism without acute cor pulmonale: Secondary | ICD-10-CM

## 2020-10-09 DIAGNOSIS — I2699 Other pulmonary embolism without acute cor pulmonale: Secondary | ICD-10-CM

## 2020-10-09 DIAGNOSIS — I82409 Acute embolism and thrombosis of unspecified deep veins of unspecified lower extremity: Secondary | ICD-10-CM | POA: Diagnosis present

## 2020-10-09 HISTORY — DX: Other pulmonary embolism without acute cor pulmonale: I26.99

## 2020-10-09 LAB — HEPARIN LEVEL (UNFRACTIONATED)
Heparin Unfractionated: <0.1 IU/mL — ABNORMAL LOW (ref 0.30–0.70)
Heparin Unfractionated: <0.1 IU/mL — ABNORMAL LOW (ref 0.30–0.70)
Heparin Unfractionated: 0.14 IU/mL — ABNORMAL LOW (ref 0.30–0.70)

## 2020-10-09 LAB — BASIC METABOLIC PANEL
Anion gap: 15 (ref 5–15)
BUN: 20 mg/dL (ref 8–23)
CO2: 14 mmol/L — ABNORMAL LOW (ref 22–32)
Calcium: 8 mg/dL — ABNORMAL LOW (ref 8.9–10.3)
Chloride: 101 mmol/L (ref 98–111)
Creatinine, Ser: 1.08 mg/dL (ref 0.61–1.24)
GFR, Estimated: >60 mL/min (ref >60)
Glucose, Bld: 87 mg/dL (ref 70–99)
Potassium: 4.1 mmol/L (ref 3.5–5.1)
Sodium: 130 mmol/L — ABNORMAL LOW (ref 135–145)

## 2020-10-09 LAB — ECHOCARDIOGRAM COMPLETE
Area-P 1/2: 2.82 cm2
Calc EF: 57 %
Height: 69 in
S' Lateral: 3 cm
Single Plane A2C EF: 59.6 %
Single Plane A4C EF: 50.8 %
Weight: 2896 oz

## 2020-10-09 LAB — GLUCOSE, CAPILLARY
Glucose-Capillary: 41 mg/dL — CL (ref 70–99)
Glucose-Capillary: 64 mg/dL — ABNORMAL LOW (ref 70–99)
Glucose-Capillary: 110 mg/dL — ABNORMAL HIGH (ref 70–99)
Glucose-Capillary: 73 mg/dL (ref 70–99)
Glucose-Capillary: 85 mg/dL (ref 70–99)
Glucose-Capillary: 75 mg/dL (ref 70–99)

## 2020-10-09 LAB — CBC
HCT: 30 % — ABNORMAL LOW (ref 39.0–52.0)
Hemoglobin: 9.4 g/dL — ABNORMAL LOW (ref 13.0–17.0)
MCH: 25.8 pg — ABNORMAL LOW (ref 26.0–34.0)
MCHC: 31.3 g/dL (ref 30.0–36.0)
MCV: 82.4 fL (ref 80.0–100.0)
Platelets: 383 10*3/uL (ref 150–400)
RBC: 3.64 MIL/uL — ABNORMAL LOW (ref 4.22–5.81)
RDW: 13.9 % (ref 11.5–15.5)
WBC: 11 10*3/uL — ABNORMAL HIGH (ref 4.0–10.5)
nRBC: 0 % (ref 0.0–0.2)

## 2020-10-09 LAB — ANTITHROMBIN III: AntiThromb III Func: 55 % — ABNORMAL LOW (ref 75–120)

## 2020-10-09 LAB — EXPECTORATED SPUTUM ASSESSMENT W GRAM STAIN, RFLX TO RESP C

## 2020-10-09 MED ORDER — GLUCOSE 40 % PO GEL
ORAL | Status: AC
  Filled 2020-10-09: qty 1

## 2020-10-09 MED ORDER — DEXTROSE 50 % IV SOLN
INTRAVENOUS | Status: AC
  Filled 2020-10-09: qty 50

## 2020-10-09 MED ORDER — HEPARIN BOLUS VIA INFUSION
3000.0000 [IU] | Freq: Once | INTRAVENOUS | Status: AC
  Administered 2020-10-09: 3000 [IU] via INTRAVENOUS
  Filled 2020-10-09: qty 3000

## 2020-10-09 MED ORDER — GLUCOSE 40 % PO GEL
2.0000 | ORAL | Status: AC
  Administered 2020-10-09: 75 g via ORAL

## 2020-10-09 NOTE — Progress Notes (Signed)
  Echocardiogram 2D Echocardiogram has been performed.  Antonio Nash 10/09/2020, 3:41 PM

## 2020-10-09 NOTE — Progress Notes (Signed)
ANTICOAGULATION CONSULT NOTE  Pharmacy Consult for Heparin Indication: pulmonary embolus   Labs: Recent Labs    10/08/20 1059 10/08/20 1237 10/09/20 0041  HGB 10.8*  --   --   HCT 35.7*  --   --   PLT 489*  --   --   LABPROT  --  15.1  --   INR  --  1.2  --   HEPARINUNFRC  --   --  <0.10*  CREATININE 1.37*  --  1.08    Assessment: Patient is a 79 yom that presents to the ED with hemoptysis. 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 CT was preformed and showed concerns for covid as well as confirmed a PE. Pharmacy has been asked to dose heparin at this time for this patient PE.  Initial heparin level <0.1 units/ml.  No issues noted with infusion  Goal of Therapy:  Heparin level 0.3-0.7 units/ml Monitor platelets by anticoagulation protocol: Yes   Plan:  - Heparin bolus 3000 units IV x 1 dose - Heparin drip @ 1600 units/hr - Heparin level in 6-8 hours  - Monitor patient for s/s of bleeding and CBC while on heparin   Jady Braggs, Haskel Schroeder PharmD 10/09/2020,1:50 AM

## 2020-10-09 NOTE — TOC Benefit Eligibility Note (Signed)
Transition of Care [facility]) Benefit Eligibility Note    Patient Details  Name: Antonio Nash MRN: [id number] Date of Birth: 10-Jun-1956   Medication/Dose: ELIQUIS  5 MG BID- CO-PAY- $25.00  Covered?: Yes  Tier: 2 Drug (PREFERRED)  Prescription Coverage Preferred Pharmacy: [facility]  Spoke with Person/Company/Phone Number:: KIM  @  [employer] # [phone]  Co-Pay: $25.00  Prior Approval: No  Deductible:  (NO DEDUCTIBLE WITH PLAN  and OUT-OF-POCKET-UNMET)  Additional Notes: XARELTO 20 MFG DAILY -COVER-YES   CO-PAY- $25.00  TIER- 2 DRUG PREFERRED  P/A-NO    Mardene Sayer Phone Number: 10/09/2020, 12:29 PM

## 2020-10-09 NOTE — Consult Note (Addendum)
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 Possible left subsegmental involvement. Borderline right heart strain. Borderline right heart strain. Bilateral peripheral areas of consolidation and atelectasis) Sars cov2 PCR is negative  On Anticoagulation   Recommendations  -Continue Daptomycin and ceftriaxone with previous planned end date 10/09/20 ( significant improvement in back pain from last admission + no new neurological findings on exam + downtrending ESR )  - will get blood cultures given he had fevers ( ordered) - Check HCV ab for screening   - Monitor fever curve  Rest of the management as per the primary team. Please call with questions or concerns.  Thank you for the consult   __________________________________________________________________________________________________________ HPI and Hospital Course: 65 Year Old male with PMH of HTN, DM and recent h/o osteomyelitis/discitis of the T5-6 region( s/p disc aspiration with no growth) on empiric Vancomycin and ceftriaxone who  presented to the ED on 2/21 with complaints of hemoptysis. He started producing blood-tinged sputum for last 2 days a/w low-grade fever which was unrelieved by tylenol and tramadol. He endorses getting IV  antibiotics as instructed through his RT arm PICC line. Denies any pain/tenderness and swelling at the PICC line site.   In terms of his back pain, he says the back pain has significantly improved from his last admission when it was 10/10 and now it is 3/10. He says he has been coughing up so much it has started to hurt his ribs and also triggered his back pain. But he definitely thinks his back pain has improved with IV antibiotics compared to his last admission. Denies any new numbness/weakness/tingling in his lower extremities. Denies any bowel or bladder incontinence   Denied any SOB, chest pain. Denies N/V/D and abdominal pain. Denies any rashes and joint pain. Denies anyu   ROS: General- Fevers+, Denies chills and sweats, denies  loss of appetite and loss of weight HEENT - Denies headache, blurry vision, neck pain, sinus pain Chest - COUGH AND CHEST PAIN+, Denies any SOB CVS- Denies any dizziness/lightheadedness, syncopal attacks, palpitations Abdomen- Denies any nausea, vomiting, abdominal pain, hematochezia and diarrhea Neuro - Denies any weakness, numbness, tingling sensation Psych - Denies any changes in mood irritability or depressive symptoms GU- Denies any burning, dysuria, hematuria or increased frequency of urination Skin - denies any rashes/lesions MSK - denies any joint pain/swelling or restricted ROM, LOW BACK PAIN +  Past Medical History:  Diagnosis Date  .  Diabetes mellitus without complication ([place])   . Hypertension   . PE (pulmonary thromboembolism) ([place]) 10/09/2020    Scheduled Meds: . amLODipine  10 mg Oral Daily  . carvedilol  12.5 mg Oral Daily  . Chlorhexidine Gluconate Cloth  6 each Topical Daily  . dextrose      . dextrose      . dextrose      . lisinopril  40 mg Oral Daily   Continuous Infusions: . cefTRIAXone (ROCEPHIN)  IV Stopped (10/08/20 2217)  . DAPTOmycin (CUBICIN)  IV Stopped (10/08/20 2126)  . heparin 1,850 Units/hr (10/09/20 1145)   PRN  Meds:.acetaminophen **OR** acetaminophen, methocarbamol, ondansetron **OR** ondansetron (ZOFRAN) IV, polyethylene glycol, sodium chloride flush, traMADol  Allergies  Allergen Reactions  . Codeine Itching    Other reaction(s): Other (See Comments) Skin crawls  . Metformin Diarrhea   Social History   Socioeconomic History  . Marital status: Married    Spouse name: Not on file  . Number of children: Not on file  . Years of education: Not on file  . Highest education level: Not on file  Occupational History  . Not on file  Tobacco Use  . Smoking status: Never Smoker  . Smokeless tobacco: Never Used  Vaping Use  . Vaping Use: Never used  Substance and Sexual Activity  . Alcohol use: Not Currently  . Drug use: Never  . Sexual activity: Not on file  Other Topics Concern  . Not on file  Social History Narrative  . Not on file   Social Determinants of Health   Financial Resource Strain: Not on file  Food Insecurity: Not on file  Transportation Needs: Not on file  Physical Activity: Not on file  Stress: Not on file  Social Connections: Not on file  Intimate Partner Violence: Not on file    Vitals BP 122/62 (BP Location: Left Arm)   Pulse 96   Temp (!) 100.7 F (38.2 C) (Oral)   Resp 18   Ht '5\' 9"'  (1.753 m)   Wt 82.1 kg   SpO2 93%   BMI 26.73 kg/m   Average built male sitting up in bed, coughing.  On [place] Chest - rales + CVS- Normal s1s2, RRR  Abdomen - soft, NT, BS+ Extremities - bilateral LE power 5/5 Skin - no rashes  Back - no tenderness in the vertebra :  Pertinent Microbiology Results for orders placed or performed during the hospital encounter of 10/08/20  Resp Panel by RT-PCR (Flu A&B, Covid) Nasopharyngeal Swab     Status: None   Collection Time: 10/08/20 12:39 PM   Specimen: Nasopharyngeal Swab; Nasopharyngeal(NP) swabs in vial transport medium  Result Value Ref Range Status   SARS Coronavirus 2 by RT PCR NEGATIVE NEGATIVE Final    Comment:  (NOTE) SARS-CoV-2 target nucleic acids are NOT DETECTED.  The SARS-CoV-2 RNA is generally detectable in upper respiratory specimens during the acute phase of infection. The lowest concentration of SARS-CoV-2 viral copies this assay can detect is 138 copies/mL. A negative result does not preclude SARS-Cov-2 infection and should not be used as the sole basis for treatment or other patient management decisions. A negative result may occur with  improper specimen collection/handling, submission of specimen other than nasopharyngeal swab, presence of viral mutation(s) within the areas targeted by this assay, and inadequate number of viral copies(<138 copies/mL). A negative result must be combined with clinical observations, patient history, and epidemiological information. The expected result is Negative.  Fact Sheet for  Patients:  EntrepreneurPulse.com.au  Fact Sheet for Healthcare Providers:  IncredibleEmployment.be  This test is no t yet approved or cleared by the [place] FDA and  has been authorized for detection and/or diagnosis of SARS-CoV-2 by FDA under an Emergency Use Authorization (EUA). This EUA will remain  in effect (meaning this test can be used) for the duration of the COVID-19 declaration under Section 564(b)(1) of the Act, 21 U.S.C.section 360bbb-3(b)(1), unless the authorization is terminated  or revoked sooner.       Influenza A by PCR NEGATIVE NEGATIVE Final   Influenza B by PCR NEGATIVE NEGATIVE Final    Comment: (NOTE) The Xpert Xpress SARS-CoV-2/FLU/RSV plus assay is intended as an aid in the diagnosis of influenza from Nasopharyngeal swab specimens and should not be used as a sole basis for treatment. Nasal washings and aspirates are unacceptable for Xpert Xpress SARS-CoV-2/FLU/RSV testing.  Fact Sheet for Patients: EntrepreneurPulse.com.au  Fact Sheet for Healthcare  Providers: IncredibleEmployment.be  This test is not yet approved or cleared by the [place] FDA and has been authorized for detection and/or diagnosis of SARS-CoV-2 by FDA under an Emergency Use Authorization (EUA). This EUA will remain in effect (meaning this test can be used) for the duration of the COVID-19 declaration under Section 564(b)(1) of the Act, 21 U.S.C. section 360bbb-3(b)(1), unless the authorization is terminated or revoked.  Performed at [facility], [place] [address] [place], [place] [address]      Pertinent Lab seen by me: CBC Latest Ref Rng & Units 10/09/2020 10/08/2020 09/01/2020  WBC 4.0 - 10.5 K/uL 11.0(H) 12.5(H) 7.8  Hemoglobin 13.0 - 17.0 g/dL 9.4(L) 10.8(L) 10.2(L)  Hematocrit 39.0 - 52.0 % 30.0(L) 35.7(L) 32.8(L)  Platelets 150 - 400 K/uL 383 489(H) 580(H)    CMP Latest Ref Rng & Units 10/09/2020 10/08/2020 09/04/2020  Glucose 70 - 99 mg/dL 87 112(H) 122(H)  BUN 8 - 23 mg/dL 20 24(H) 13  Creatinine 0.61 - 1.24 mg/dL 1.08 1.37(H) 0.88  Sodium 135 - 145 mmol/L 130(L) 132(L) 134(L)  Potassium 3.5 - 5.1 mmol/L 4.1 4.2 4.0  Chloride 98 - 111 mmol/L 101 96(L) 102  CO2 22 - 32 mmol/L 14(L) 23 22  Calcium 8.9 - 10.3 mg/dL 8.0(L) 8.8(L) 8.7(L)  Total Protein 6.5 - 8.1 g/dL - 7.9 -  Total Bilirubin 0.3 - 1.2 mg/dL - 0.8 -  Alkaline Phos 38 - 126 U/L - 42 -  AST 15 - 41 U/L - 32 -  ALT 0 - 44 U/L - 42 -    Pertinent Imagings/Other Imagings Plain films and CT images have been personally visualized and interpreted; radiology reports have been reviewed. Decision making incorporated into the Impression / Recommendations.  Chest Xray 10/08/20 FINDINGS: 1152 hours. Right arm PICC projects to the mid to lower right atrial level. The heart size and mediastinal contours are stable. There are lower lung volumes with new patchy bibasilar opacities which may reflect inflammation, hemorrhage or atelectasis. There are probable upper lobe  components as well, and the findings are suspicious for multilobar pneumonia. There is no edema, significant pleural effusion or pneumothorax. The bones appear unremarkable.  IMPRESSION: Patchy bibasilar opacities and probable upper lobe components suspicious for multilobar pneumonia, potentially viral in etiology. Recommend radiographic follow-up to document clearing.  CT Angio Chest PE 10/08/20  IMPRESSION: Acute pulmonary emboli involving right lobar, segmental, and subsegmental branches. Possible left subsegmental involvement. Borderline right heart strain.  Bilateral peripheral areas of consolidation and atelectasis. Likely reflects pneumonia (  COVID-19 is possible). A component of pulmonary infarction is also possible.  Osteomyelitis discitis at T5-T6 with paraspinal soft tissue. Retropulsion of bone into the spinal canal likely effacing the ventral subarachnoid space. This would be better evaluated on MRI.  These results were called by telephone at the time of interpretation on 10/08/2020 at 1:48 pm to provider [facility] , who verbally acknowledged these results  I have spent 60 minutes for this patient encounter including review of prior medical records with greater than 50% of time being face to face and coordination of their care.  Electronically signed by:   Rosiland Oz, MD Infectious Disease Physician [facility] Pager: [phone]

## 2020-10-09 NOTE — Progress Notes (Signed)
ANTICOAGULATION CONSULT NOTE  Pharmacy Consult for Heparin Indication: pulmonary embolus  Allergies  Allergen Reactions  . Codeine Itching    Other reaction(s): Other (See Comments) Skin crawls  . Metformin Diarrhea    Patient Measurements: Height: 5\' 9"  (175.3 cm) Weight: 82.1 kg (181 lb) IBW/kg (Calculated) : 70.7 Heparin Dosing Weight: 82.1 kg  Vital Signs: Temp: 100.7 F (38.2 C) (02/22 0930) Temp Source: Oral (02/22 0930) BP: 122/62 (02/22 0930) Pulse Rate: 96 (02/22 0930)  Labs: Recent Labs    10/08/20 1059 10/08/20 1237 10/09/20 0041 10/09/20 0122 10/09/20 0828  HGB 10.8*  --   --  9.4*  --   HCT 35.7*  --   --  30.0*  --   PLT 489*  --   --  383  --   LABPROT  --  15.1  --   --   --   INR  --  1.2  --   --   --   HEPARINUNFRC  --   --  <0.10*  --  <0.10*  CREATININE 1.37*  --  1.08  --   --     Estimated Creatinine Clearance: 67.3 mL/min (by C-G formula based on SCr of 1.08 mg/dL).   Medical History: Past Medical History:  Diagnosis Date  . Diabetes mellitus without complication (HCC)   . Hypertension     Medications:  Scheduled:  . amLODipine  10 mg Oral Daily  . carvedilol  12.5 mg Oral Daily  . Chlorhexidine Gluconate Cloth  6 each Topical Daily  . dextrose      . dextrose      . dextrose      . lisinopril  40 mg Oral Daily    Assessment: Patient is a 28 yom that presents to the ED with hemoptysis. 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 CT was preformed and showed concerns for covid as well as confirmed a PE. Pharmacy has been asked to dose heparin at this time for this patient PE.   Heparin level remains undetectable s/p rate increase to 1600 units/hr, rate confirmed and no infusion issues, observed with RN at bedside.    Goal of Therapy:  Heparin level 0.3-0.7 units/ml Monitor platelets by anticoagulation protocol: Yes   Plan:  Rebolus heparin 3000 units IV x 1, increase gtt to 1850 units/hr F/u 6  hour heparin level  71, PharmD Clinical Pharmacist Please check AMION for all [facility] Pharmacy numbers 10/09/2020 10:35 AM

## 2020-10-09 NOTE — Progress Notes (Signed)
Hypoglycemic Event  CBG: 41  Treatment:75 grams of glucose gel orally  Symptoms:hungry, thirsty   Follow-up CBG: Time:0808 CBG Result:64  Possible Reasons for Event:  No supper last night and received insulin   Comments/MD notified:Dr. Ephriam Knuckles, Internal Medicine    Laureen Ochs

## 2020-10-09 NOTE — Progress Notes (Signed)
HD#1 Subjective:  Overnight Events: Fever to 101.4, 2 episodes of    Patient is seen at bedside. Endorses persistent pain of his back. Reports bloody sputum. Reports persistent short winded but better than yesterday. Denies weakness or numbness of LE. States that swelling of lower back improved. Reports intermittent fever at home.    Objective:  Vital signs in last 24 hours: Vitals:   10/08/20 2322 10/09/20 0307 10/09/20 0324 10/09/20 0703  BP: 105/61 137/70  128/68  Pulse: 95 100  96  Resp: 20 18  18   Temp: 99.9 F (37.7 C) 99.8 F (37.7 C)  99.7 F (37.6 C)  TempSrc: Oral Oral  Oral  SpO2: 96% 91% 94% 96%  Weight:      Height:       Supplemental O2: Nasal Cannula SpO2: 96 % O2 Flow Rate (L/min): 2 L/min   Physical Exam:  Physical Exam Constitutional:      Appearance: Normal appearance.  HENT:     Head: Normocephalic and atraumatic.     Right Ear: External ear normal.     Left Ear: External ear normal.     Mouth/Throat:     Mouth: Mucous membranes are moist.     Pharynx: Oropharynx is clear.  Eyes:     Extraocular Movements: Extraocular movements intact.     Conjunctiva/sclera: Conjunctivae normal.     Pupils: Pupils are equal, round, and reactive to light.  Cardiovascular:     Rate and Rhythm: Normal rate and regular rhythm.     Pulses: Normal pulses.     Heart sounds: Normal heart sounds.  Pulmonary:     Effort: Pulmonary effort is normal.     Breath sounds: Rhonchi (bilateral) present.  Abdominal:     General: Abdomen is flat. Bowel sounds are normal.     Palpations: Abdomen is soft.  Musculoskeletal:        General: No tenderness.     Right lower leg: No edema.     Left lower leg: No edema.     Comments: No calf tenderness, no RUE edema  Skin:    General: Skin is warm and dry.     Capillary Refill: Capillary refill takes less than 2 seconds.  Neurological:     General: No focal deficit present.     Mental Status: He is alert and oriented to  person, place, and time. Mental status is at baseline.     Filed Weights   10/08/20 1121  Weight: 82.1 kg     Intake/Output Summary (Last 24 hours) at 10/09/2020 0717 Last data filed at 10/09/2020 10/11/2020 Gross per 24 hour  Intake 1391.06 ml  Output 1425 ml  Net -33.94 ml   Net IO Since Admission: -33.94 mL [10/09/20 0717]  Pertinent Labs: CBC Latest Ref Rng & Units 10/09/2020 10/08/2020 09/01/2020  WBC 4.0 - 10.5 K/uL 11.0(H) 12.5(H) 7.8  Hemoglobin 13.0 - 17.0 g/dL 09/03/2020) 10.8(L) 10.2(L)  Hematocrit 39.0 - 52.0 % 30.0(L) 35.7(L) 32.8(L)  Platelets 150 - 400 K/uL 383 489(H) 580(H)    CMP Latest Ref Rng & Units 10/09/2020 10/08/2020 09/04/2020  Glucose 70 - 99 mg/dL 87 09/06/2020) 580(D)  BUN 8 - 23 mg/dL 20 983(J) 13  Creatinine 0.61 - 1.24 mg/dL 82(N 0.53) 9.76(B  Sodium 135 - 145 mmol/L 130(L) 132(L) 134(L)  Potassium 3.5 - 5.1 mmol/L 4.1 4.2 4.0  Chloride 98 - 111 mmol/L 101 96(L) 102  CO2 22 - 32 mmol/L 14(L) 23 22  Calcium  8.9 - 10.3 mg/dL 8.0(L) 8.8(L) 8.7(L)  Total Protein 6.5 - 8.1 g/dL - 7.9 -  Total Bilirubin 0.3 - 1.2 mg/dL - 0.8 -  Alkaline Phos 38 - 126 U/L - 42 -  AST 15 - 41 U/L - 32 -  ALT 0 - 44 U/L - 42 -    Imaging: CT Angio Chest PE W/Cm &/Or Wo Cm  Result Date: 10/08/2020 CLINICAL DATA:  Hemoptysis, abnormal chest x-ray EXAM: CT ANGIOGRAPHY CHEST WITH CONTRAST TECHNIQUE: Multidetector CT imaging of the chest was performed using the standard protocol during bolus administration of intravenous contrast. Multiplanar CT image reconstructions and MIPs were obtained to evaluate the vascular anatomy. CONTRAST:  74mL OMNIPAQUE IOHEXOL 350 MG/ML SOLN COMPARISON:  None. FINDINGS: Cardiovascular: Satisfactory opacification of the pulmonary arteries to the segmental level. Filling defects are present within right lobar, segmental, and subsegmental branches. No proximal filling defects on the left. Possible subsegmental branch defects on the left. Borderline right heart strain.  No pericardial effusion. Mediastinum/Nodes: Prominent mildly enlarged mediastinal and hilar lymph nodes are likely reactive. For example, right pretracheal node near the carina measuring 1.3 cm short axis. Lungs/Pleura: Bilateral peripheral areas of consolidation and atelectasis primarily involving posterior upper lobes and superior lower lobes as well as lung bases. No pleural effusion. No pneumothorax. Upper Abdomen: Multiple low-attenuation liver lesions consistent with cysts. Musculoskeletal: Destructive changes at the opposing T5 and T6 vertebral bodies with paraspinal soft tissue. There is retropulsion of bone into the canal. Review of the MIP images confirms the above findings. IMPRESSION: Acute pulmonary emboli involving right lobar, segmental, and subsegmental branches. Possible left subsegmental involvement. Borderline right heart strain. Bilateral peripheral areas of consolidation and atelectasis. Likely reflects pneumonia (COVID-19 is possible). A component of pulmonary infarction is also possible. Osteomyelitis discitis at T5-T6 with paraspinal soft tissue. Retropulsion of bone into the spinal canal likely effacing the ventral subarachnoid space. This would be better evaluated on MRI. These results were called by telephone at the time of interpretation on 10/08/2020 at 1:48 pm to provider [facility] , who verbally acknowledged these results. Electronically Signed   By: Guadlupe Spanish M.D.   On: 10/08/2020 13:57   DG Chest Port 1 View  Result Date: 10/08/2020 CLINICAL DATA:  Coughing up blood since yesterday.  Chronic cough. EXAM: PORTABLE CHEST 1 VIEW COMPARISON:  Radiographs 09/01/2020 and 07/16/2020. FINDINGS: 1152 hours. Right arm PICC projects to the mid to lower right atrial level. The heart size and mediastinal contours are stable. There are lower lung volumes with new patchy bibasilar opacities which may reflect inflammation, hemorrhage or atelectasis. There are probable upper lobe  components as well, and the findings are suspicious for multilobar pneumonia. There is no edema, significant pleural effusion or pneumothorax. The bones appear unremarkable. IMPRESSION: Patchy bibasilar opacities and probable upper lobe components suspicious for multilobar pneumonia, potentially viral in etiology. Recommend radiographic follow-up to document clearing. Electronically Signed   By: Carey Bullocks M.D.   On: 10/08/2020 12:07    Assessment/Plan:   Active Problems:   Pulmonary emboli [facility])   Patient Summary: Mr. Nuttall is a 65 y/o male with a past medical history of hypertension, diabetes, T5-6 discitis/osteomyletis currently on IV daptomycin and ceftriaxone presents for worsening back pain and shortness of breath for the past 2 days found to have right pulmonary emboli.  PE Reports SOB improved but still requiring 2L [place]. BP stable. DVT study with right axillary DVT. PE likely provoked in setting of acute infection and  PICC line. Heparin level low this morning . Will continue on heparin drip today, may consider starting oral if he continues to improve. Will discuss antibiotic timing and PICC line with ID.  -Continue Heparin gtt - Follow up echo - Follow up respiratory viral panel - O2 as need to maintain saturation >90% - Monitor on tele  Fever  Presenting with SOB in setting of PE. Consolidation on CT and continues to be intermittently febrile with concern for viral respiratory infection unlikely to have a bacterial infection as he has been on IV antibiotics. - respiratory viral panel - sputum culture - Trend fever curve  T5-T6 Discitis/Osteomylitis  Back pain a little better today. 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  Admitted between 08/31/2020 and 09/03/2020 for this ad a paraspinal biopsy done at that time, cultures negative. Discharged on IV daptomycin and Ceftriaxone until 10/12/2020. Follow on 2/2 with Dr. Luciana Axe and doing well at that time. Intermittently febrile  overnight. Concerned infection may not be well controlled. Will discuss need for repeat MRI and change in antibiotics with ID. - ID consulted, appreciate reccomendations - Continue daptomycin and ceftriaxone -Tramadol 50 mg three time daily  PRN, acetaminophen every 6 hours PRN.  Hypoglycemia  Diabetes mellitus type 2 Appears diet controlled, Last A1c of 7.4. 2 episodes of hypoglycemia this morning before breakfast. Improved with juice to the 60s. May be in setting of acute infection.  If he continue to have episodes consider further endocrine workup.  - Monitory CBGs  AKI-resolved sCr on admission of 1.37 baseline ~1.0. Back to baseline of 1.08 today -monitor BMP  Hypertension - Continue home regimen of Coreg and lisinopril.   -Monitor blood pressure  Diet: Heart Healthy Carb modified IVF: None,s/p 1L bolus VTE: Heparin gtt Code: Full  Dispo: Anticipated discharge to Pending in 2 days pending further mangement.   Quincy Simmonds, MD 10/09/2020, 7:17 AM Pager: [phone]  Please contact the on call pager after 5 pm and on weekends at [phone].

## 2020-10-09 NOTE — Progress Notes (Signed)
Follow up CBG at 0855 was 110. Will continue to monitor.

## 2020-10-09 NOTE — Progress Notes (Signed)
Upper extremity venous RT study completed.  Preliminary results relayed to RN at bedside. Sandre Kitty, MD also notified.   See CV Proc for preliminary results report.   Jean Rosenthal, RDMS

## 2020-10-09 NOTE — TOC Initial Note (Addendum)
Transition of Care [facility]) - Initial/Assessment Note    Patient Details  Name: Antonio Nash MRN: [id number] Date of Birth: Jun 11, 1956  Transition [facility]) CM/SW Contact:    Kingsley Plan, RN Phone Number: 10/09/2020, 10:52 AM  Clinical Narrative:                 Patient from home with wife.   Patient from [facility] with [name]) . Both agencies aware of patient's admission.   Patient uses his BCBS to fill prescriptions.   Will await determination on anticoagulate choice, can provide cards and do benefit check.  [facility]  [phone], for patients social worker contact information and PCP fax. Received a message phone lines down. Will try again   DR Ranee Gosselin fax [phone] faxed clinicals.   [facility] Social worker Bonne Dolores phone [phone] ext [phone] called and left voicemail with my direst call back number.    PCP: Dr[facility]renLippard/ [facility]  Benefit check entered for Eliquis 5mg  BID, Xarelto 20mg /day    Pt's home address: [address] [place] [address], [place].    Expected Discharge Plan: Home w Home Health Services     Patient Goals and CMS Choice Patient states their goals for this hospitalization and ongoing recovery are:: to return to home CMS Medicare.gov Compare Post Acute Care list provided to:: Patient Choice offered to / list presented to : Patient  Expected Discharge Plan and Services Expected Discharge Plan: Home w Home Health Services   Discharge Planning Services: CM Consult Post Acute Care Choice: Home Health Living arrangements for the past 2 months: Single Family Home                 DME Arranged: N/A DME Agency: NA       HH Arranged: RN HH [phone] Home Health Care Date [facility] Agency Contacted: 10/09/20 Time HH Agency Contacted: 1051 Representative spoke with at [facility] Agency: 10/11/20  Prior Living Arrangements/Services Living arrangements for the past 2 months:  Single Family Home Lives with:: Spouse Patient language and need for interpreter reviewed:: Yes Do you feel safe going back to the place where you live?: Yes      Need for Family Participation in Patient Care: Yes (Comment) Care giver support system in place?: Yes (comment) Current home services: Home RN Criminal Activity/Legal Involvement Pertinent to Current Situation/Hospitalization: No - Comment as needed  Activities of Daily Living Home Assistive Devices/Equipment: Blood pressure cuff,CBG Meter ADL Screening (condition at time of admission) Patient's cognitive ability adequate to safely complete daily activities?: Yes Is the patient deaf or have difficulty hearing?: No Does the patient have difficulty seeing, even when wearing glasses/contacts?: No Does the patient have difficulty concentrating, remembering, or making decisions?: No Patient able to express need for assistance with ADLs?: Yes Does the patient have difficulty dressing or bathing?: No Independently performs ADLs?: Yes (appropriate for developmental age) Does the patient have difficulty walking or climbing stairs?: No Weakness of Legs: None Weakness of Arms/Hands: None  Permission Sought/Granted   Permission granted to share information with : Yes, Verbal Permission Granted     Permission granted to share info w AGENCY: [phone], [facility]        Emotional Assessment Appearance:: Appears stated age Attitude/Demeanor/Rapport: Engaged Affect (typically observed): Accepting Orientation: : Oriented to Situation,Oriented to  Time,Oriented to Place,Oriented to Self Alcohol / Substance Use: Not Applicable Psych Involvement: No (comment)  Admission  diagnosis:  Pulmonary emboli (HCC) [I26.99] Other pulmonary embolism without acute cor pulmonale, unspecified chronicity (HCC) [I26.99] Patient Active Problem List   Diagnosis Date Noted  . Pulmonary emboli (HCC) 10/08/2020  . Primary osteoarthritis of left hip  10/02/2020  . Discitis of thoracolumbar region 09/19/2020  . PICC (peripherally inserted central catheter) in place 09/19/2020  . Medication monitoring encounter 09/19/2020  . Osteomyelitis (HCC) 08/31/2020  . HTN (hypertension) 08/31/2020  . Type 2 diabetes mellitus without complication (HCC) 08/31/2020   PCP:  Patient, No Pcp Per Pharmacy:   [id number] [place], [place] - [address] AT [facility] [address] [place] [address] Phone: [phone] Fax: [phone]     Social Determinants of Health (SDOH) Interventions    Readmission Risk Interventions No flowsheet data found.

## 2020-10-09 NOTE — Care Management (Addendum)
Patient from home with [name]) . Both agencies aware of patient's admission.     PCP: Dr. Leotis Shames Lipp[facility]/ [place]  Pt's home address: [address] [place], [address].   Ronny Flurry RN

## 2020-10-09 NOTE — Progress Notes (Signed)
Inpatient Diabetes Program Recommendations  AACE/ADA: New Consensus Statement on Inpatient Glycemic Control (2015)  Target Ranges:  Prepandial:   less than 140 mg/dL      Peak postprandial:   less than 180 mg/dL (1-2 hours)      Critically ill patients:  140 - 180 mg/dL   Lab Results  Component Value Date   GLUCAP 73 10/09/2020   HGBA1C 7.4 (H) 09/01/2020    Review of Glycemic Control  Diabetes history: DM2 Outpatient Diabetes medications: Jardiance 25 mg QD, Amaryl 4 mg with breakfast Current orders for Inpatient glycemic control: None. (Novolog 0-9 units tid with meals and 0-5 hs discontinued)  HgbA1C - 7.4%  Inpatient Diabetes Program Recommendations:     Novolog 0-6 units TID with meals  Spoke with pt on phone regarding his diabetes control and recent hypoglycemia this am, likely d/t not getting dinner meal last night in ED. Pt states he's only been hypoglycemic on 2 occasions at home and he treated with glucose tablets. We discussed other options such as 1/2 c juice or regular soda, and rechecking blood sugar in 15 minutes. States he checks blood sugars each day. Pt not feeling well and has fever. Poor appetite. Will follow his blood sugars while inpatient.   Thank you. Ailene Ards, RD, LDN, CDE Inpatient Diabetes Coordinator [phone]

## 2020-10-09 NOTE — Progress Notes (Signed)
ANTICOAGULATION CONSULT NOTE  Pharmacy Consult for Heparin Indication: pulmonary embolus  Allergies  Allergen Reactions  . Codeine Itching    Other reaction(s): Other (See Comments) Skin crawls  . Metformin Diarrhea    Patient Measurements: Height: 5\' 9"  (175.3 cm) Weight: 82.1 kg (181 lb) IBW/kg (Calculated) : 70.7 Heparin Dosing Weight: 82.1 kg  Vital Signs: Temp: 100.1 F (37.8 C) (02/22 1738) Temp Source: Oral (02/22 1738) BP: 95/55 (02/22 1738) Pulse Rate: 95 (02/22 1738)  Labs: Recent Labs    10/08/20 1059 10/08/20 1237 10/09/20 0041 10/09/20 0122 10/09/20 0828 10/09/20 1831  HGB 10.8*  --   --  9.4*  --   --   HCT 35.7*  --   --  30.0*  --   --   PLT 489*  --   --  383  --   --   LABPROT  --  15.1  --   --   --   --   INR  --  1.2  --   --   --   --   HEPARINUNFRC  --   --  <0.10*  --  <0.10* 0.14*  CREATININE 1.37*  --  1.08  --   --   --     Estimated Creatinine Clearance: 67.3 mL/min (by C-G formula based on SCr of 1.08 mg/dL).   Medical History: Past Medical History:  Diagnosis Date  . Diabetes mellitus without complication (HCC)   . Hypertension   . PE (pulmonary thromboembolism) (HCC) 10/09/2020    Medications:  Scheduled:  . amLODipine  10 mg Oral Daily  . carvedilol  12.5 mg Oral Daily  . Chlorhexidine Gluconate Cloth  6 each Topical Daily  . lisinopril  40 mg Oral Daily    Assessment: Patient is a 70 yom that presents to the ED with hemoptysis. 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 CT was preformed and showed concerns for covid as well as confirmed a PE. Pharmacy has been asked to dose heparin at this time for this patient PE.   Heparin level is subtherapeutic at 0.14 but now trending up. No S/Sx bleeding or infusion issues per RN.  Goal of Therapy:  Heparin level 0.3-0.7 units/ml Monitor platelets by anticoagulation protocol: Yes   Plan:  Rebolus heparin 3000 units IV x 1 Increase heparin  infusion to 2100 units/h Recheck heparin level in 6h   Fredonia Highland, PharmD, [employer], [facility] Clinical Pharmacist [phone] Please check AMION for all [facility] Pharmacy numbers 10/09/2020

## 2020-10-09 NOTE — Progress Notes (Signed)
Follow up CBG was 64 after 75 gm glucose gel given. OJ given for pt to sip on, IMTS resident notified.  Pt instructed on how to do DB exercises with the IS.  NP cough. Heparin at 16 presently.

## 2020-10-09 NOTE — Progress Notes (Signed)
CBG=73 presently. Assisted pt in ordering his lunch and gave him some snacks of crackers/peanut butter. Ultrasound completed, see results.

## 2020-10-10 ENCOUNTER — Inpatient Hospital Stay: Payer: BC Managed Care – PPO

## 2020-10-10 ENCOUNTER — Other Ambulatory Visit: Payer: Self-pay | Admitting: Pulmonary Disease

## 2020-10-10 DIAGNOSIS — I2609 Other pulmonary embolism with acute cor pulmonale: Secondary | ICD-10-CM

## 2020-10-10 DIAGNOSIS — R509 Fever, unspecified: Secondary | ICD-10-CM

## 2020-10-10 LAB — BASIC METABOLIC PANEL
Anion gap: 13 (ref 5–15)
BUN: 12 mg/dL (ref 8–23)
CO2: 19 mmol/L — ABNORMAL LOW (ref 22–32)
Calcium: 8.2 mg/dL — ABNORMAL LOW (ref 8.9–10.3)
Chloride: 103 mmol/L (ref 98–111)
Creatinine, Ser: 0.83 mg/dL (ref 0.61–1.24)
GFR, Estimated: >60 mL/min (ref >60)
Glucose, Bld: 109 mg/dL — ABNORMAL HIGH (ref 70–99)
Potassium: 3.5 mmol/L (ref 3.5–5.1)
Sodium: 135 mmol/L (ref 135–145)

## 2020-10-10 LAB — PROTEIN S, TOTAL: Protein S Ag, Total: 97 % (ref 60–150)

## 2020-10-10 LAB — BETA-2-GLYCOPROTEIN I ABS, IGG/M/A
Beta-2 Glyco I IgG: <9 GPI IgG units (ref 0–20)
Beta-2-Glycoprotein I IgA: <9 GPI IgA units (ref 0–25)
Beta-2-Glycoprotein I IgM: <9 GPI IgM units (ref 0–32)

## 2020-10-10 LAB — GLUCOSE, CAPILLARY
Glucose-Capillary: 100 mg/dL — ABNORMAL HIGH (ref 70–99)
Glucose-Capillary: 111 mg/dL — ABNORMAL HIGH (ref 70–99)
Glucose-Capillary: 149 mg/dL — ABNORMAL HIGH (ref 70–99)
Glucose-Capillary: 174 mg/dL — ABNORMAL HIGH (ref 70–99)

## 2020-10-10 LAB — CBC
HCT: 28.5 % — ABNORMAL LOW (ref 39.0–52.0)
Hemoglobin: 9 g/dL — ABNORMAL LOW (ref 13.0–17.0)
MCH: 25.6 pg — ABNORMAL LOW (ref 26.0–34.0)
MCHC: 31.6 g/dL (ref 30.0–36.0)
MCV: 81.2 fL (ref 80.0–100.0)
Platelets: 370 10*3/uL (ref 150–400)
RBC: 3.51 MIL/uL — ABNORMAL LOW (ref 4.22–5.81)
RDW: 13.9 % (ref 11.5–15.5)
WBC: 9.5 10*3/uL (ref 4.0–10.5)
nRBC: 0 % (ref 0.0–0.2)

## 2020-10-10 LAB — RESPIRATORY PANEL BY PCR

## 2020-10-10 LAB — PROTEIN C ACTIVITY: Protein C Activity: 69 % — ABNORMAL LOW (ref 73–180)

## 2020-10-10 LAB — HEPARIN LEVEL (UNFRACTIONATED)
Heparin Unfractionated: 0.25 IU/mL — ABNORMAL LOW (ref 0.30–0.70)
Heparin Unfractionated: 0.38 IU/mL (ref 0.30–0.70)

## 2020-10-10 LAB — PROTEIN C, TOTAL: Protein C, Total: 68 % (ref 60–150)

## 2020-10-10 LAB — C-REACTIVE PROTEIN: CRP: 18.2 mg/dL — ABNORMAL HIGH (ref <1.0)

## 2020-10-10 LAB — PROTEIN S ACTIVITY: Protein S Activity: 77 % (ref 63–140)

## 2020-10-10 LAB — HOMOCYSTEINE: Homocysteine: 10.5 umol/L (ref 0.0–17.2)

## 2020-10-10 MED ORDER — POTASSIUM CHLORIDE 20 MEQ PO PACK
40.0000 meq | PACK | Freq: Two times a day (BID) | ORAL | Status: DC
  Administered 2020-10-10 – 2020-10-12 (×5): 40 meq via ORAL
  Filled 2020-10-10 (×5): qty 2

## 2020-10-10 MED ORDER — APIXABAN 5 MG PO TABS
10.0000 mg | ORAL_TABLET | Freq: Two times a day (BID) | ORAL | Status: DC
  Administered 2020-10-10 – 2020-10-15 (×11): 10 mg via ORAL
  Filled 2020-10-10 (×11): qty 2

## 2020-10-10 MED ORDER — APIXABAN 5 MG PO TABS: 5.0000 mg | ORAL_TABLET | Freq: Two times a day (BID) | ORAL | Status: DC

## 2020-10-10 MED ORDER — LACTATED RINGERS IV BOLUS
500.0000 mL | Freq: Once | INTRAVENOUS | Status: AC
  Administered 2020-10-10: 500 mL via INTRAVENOUS

## 2020-10-10 MED ORDER — HEPARIN BOLUS VIA INFUSION
3000.0000 [IU] | Freq: Once | INTRAVENOUS | Status: AC
  Administered 2020-10-10: 3000 [IU] via INTRAVENOUS
  Filled 2020-10-10: qty 3000

## 2020-10-10 MED ORDER — DOXYCYCLINE HYCLATE 100 MG PO TABS
100.0000 mg | ORAL_TABLET | Freq: Two times a day (BID) | ORAL | Status: DC
  Administered 2020-10-10 – 2020-10-15 (×10): 100 mg via ORAL
  Filled 2020-10-10 (×10): qty 1

## 2020-10-10 NOTE — TOC Progression Note (Addendum)
Transition of Care [facility]) - Progression Note    Patient Details  Name: Antonio Nash MRN: [id number] Date of Birth: [birth year]  Transition of Care [facility]) CM/SW Contact  Nadene Rubins Adria Devon, RN Phone Number: 10/10/2020, 12:17 PM  Clinical Narrative:     [facility]'s [facility] social worker returned call. Patient has been approved home health and infusion services. At discharge discharge summary will need to be faxed to [facility] Dr Clarene Essex at [phone].  Discussed Eliquis. Patient can received 30 days free at discharge with card. (If [facility] used they will provide 30 days free). Benefit check through BCBS $25.00 per month. NCM asked if he could get Eliquis through the [place]. Ms Yetta Barre stated Eliquis is not on their formulary. Patient would need to schedule an appointment with Dr Clarene Essex. If Dr Clarene Essex approved Eliquis it would take 30 days for [facility] to supply patient with Eliquis. Eliquis would need to be approved every 30 days.   NCM provided patient with 30 day free card. NCM called Eliquis program he does not qualify for the co pay card because he has Medicare A and [facility].   Patient aware   Expected Discharge Plan: Home w Home Health Services    Expected Discharge Plan and Services Expected Discharge Plan: Home w Home Health Services   Discharge Planning Services: CM Consult Post Acute Care Choice: Home Health Living arrangements for the past 2 months: Single Family Home                 DME Arranged: N/A DME Agency: NA       HH Arranged: RN HH Agency: [name] Date [facility] Agency Contacted: 10/09/20 Time HH Agency Contacted: 1051 Representative spoke with at [facility] Agency: Kandee Keen   Social Determinants of Health (SDOH) Interventions    Readmission Risk Interventions No flowsheet data found.

## 2020-10-10 NOTE — Progress Notes (Signed)
   10/10/20 0215  Assess: MEWS Score  Temp (!) 102.2 F (39 C)  BP (!) 110/54  Pulse Rate 96  Resp 17  SpO2 96 %  O2 Device Nasal Cannula  Assess: MEWS Score  MEWS Temp 2  MEWS Systolic 0  MEWS Pulse 0  MEWS RR 0  MEWS LOC 0  MEWS Score 2  MEWS Score Color Yellow  Assess: if the MEWS score is Yellow or Red  Were vital signs taken at a resting state? Yes  Focused Assessment No change from prior assessment  Early Detection of Sepsis Score *See Row Information* Low  MEWS guidelines implemented *See Row Information* No, previously yellow, continue vital signs every 4 hours  Treat  MEWS Interventions Administered prn meds/treatments  Pain Scale 0-10  Pain Score 2  Pain Type Acute pain  Pain Location Back  Pain Orientation Right;Lower  Pain Descriptors / Indicators Discomfort  Pain Onset Awakened from sleep  Pain Intervention(s) Repositioned  Notify: Charge Nurse/RN  Name of Charge Nurse/RN Notified Delman Cheadle, RN  Date Charge Nurse/RN Notified 10/10/20  Time Charge Nurse/RN Notified 0225  Notify: Provider  Provider Name/Title Intern Claudette Laws  Date Provider Notified 10/10/20  Time Provider Notified [phone]  Notification Type Page  Notification Reason Other (Comment) (fever)  Document  Patient Outcome Stabilized after interventions  Progress note created (see row info) Yes

## 2020-10-10 NOTE — Progress Notes (Signed)
CXR and f/u appt with me requested in 6-8 weeks for PE, possible Pna.

## 2020-10-10 NOTE — Discharge Instructions (Signed)
Information on my medicine - ELIQUIS (apixaban)  This medication education was reviewed with me or my healthcare representative as part of my discharge preparation.  Why was Eliquis prescribed for you? Eliquis was prescribed to treat blood clots that may have been found in the veins of your legs (deep vein thrombosis) or in your lungs (pulmonary embolism) and to reduce the risk of them occurring again.  What do You need to know about Eliquis ? The starting dose is 10 mg (two 5 mg tablets) taken TWICE daily for the FIRST SEVEN (7) DAYS, then on 10/17/2020 the dose is reduced to ONE 5 mg tablet taken TWICE daily.  Eliquis may be taken with or without food.   Try to take the dose about the same time in the morning and in the evening. If you have difficulty swallowing the tablet whole please discuss with your pharmacist how to take the medication safely.  Take Eliquis exactly as prescribed and DO NOT stop taking Eliquis without talking to the doctor who prescribed the medication.  Stopping may increase your risk of developing a new blood clot.  Refill your prescription before you run out.  After discharge, you should have regular check-up appointments with your healthcare provider that is prescribing your Eliquis.    What do you do if you miss a dose? If a dose of ELIQUIS is not taken at the scheduled time, take it as soon as possible on the same day and twice-daily administration should be resumed. The dose should not be doubled to make up for a missed dose.  Important Safety Information A possible side effect of Eliquis is bleeding. You should call your healthcare provider right away if you experience any of the following: ? Bleeding from an injury or your nose that does not stop. ? Unusual colored urine (red or dark brown) or unusual colored stools (red or black). ? Unusual bruising for unknown reasons. ? A serious fall or if you hit your head (even if there is no bleeding).  Some  medicines may interact with Eliquis and might increase your risk of bleeding or clotting while on Eliquis. To help avoid this, consult your healthcare provider or pharmacist prior to using any new prescription or non-prescription medications, including herbals, vitamins, non-steroidal anti-inflammatory drugs (NSAIDs) and supplements.  This website has more information on Eliquis (apixaban): http://www.eliquis.com/eliquis/home

## 2020-10-10 NOTE — Progress Notes (Signed)
ANTICOAGULATION CONSULT NOTE - Follow Up Consult  Pharmacy Consult for heparin Indication: pulmonary embolus  Labs: Recent Labs    10/08/20 1059 10/08/20 1059 10/08/20 1237 10/09/20 0041 10/09/20 0122 10/09/20 0828 10/09/20 1831 10/10/20 0326  HGB 10.8*  --   --   --  9.4*  --   --  9.0*  HCT 35.7*  --   --   --  30.0*  --   --  28.5*  PLT 489*  --   --   --  383  --   --  370  LABPROT  --   --  15.1  --   --   --   --   --   INR  --   --  1.2  --   --   --   --   --   HEPARINUNFRC  --    < >  --  <0.10*  --  <0.10* 0.14* 0.25*  CREATININE 1.37*  --   --  1.08  --   --   --  0.83   < > = values in this interval not displayed.    Assessment: 65yo male remains subtherapeutic on heparin after rate increase though closer to goal; no gtt issues or signs of bleeding per RN.  Goal of Therapy:  Heparin level 0.3-0.7 units/ml   Plan:  Will rebolus with heparin 3000 units and increase heparin gtt by ~3 units/kg/hr to 2400 units/hr and check level in 6 hours.    Vernard Gambles, PharmD, BCPS  10/10/2020,4:16 AM

## 2020-10-10 NOTE — Progress Notes (Signed)
ANTICOAGULATION CONSULT NOTE  Pharmacy Consult for Heparin >> Apixaban Indication: pulmonary embolus  Patient Measurements: Height: 5\' 9"  (175.3 cm) Weight: 82.1 kg (181 lb) IBW/kg (Calculated) : 70.7 Heparin Dosing Weight: 82.1 kg  Vital Signs: Temp: 99.5 F (37.5 C) (02/23 0613) Temp Source: Oral (02/23 0613) BP: 101/57 (02/23 01-26-1986) Pulse Rate: 87 (02/23 0613)  Labs: Recent Labs    10/08/20 1059 10/08/20 1059 10/08/20 1237 10/09/20 0041 10/09/20 0122 10/09/20 0828 10/09/20 1831 10/10/20 0326  HGB 10.8*  --   --   --  9.4*  --   --  9.0*  HCT 35.7*  --   --   --  30.0*  --   --  28.5*  PLT 489*  --   --   --  383  --   --  370  LABPROT  --   --  15.1  --   --   --   --   --   INR  --   --  1.2  --   --   --   --   --   HEPARINUNFRC  --    < >  --  <0.10*  --  <0.10* 0.14* 0.25*  CREATININE 1.37*  --   --  1.08  --   --   --  0.83   < > = values in this interval not displayed.    Estimated Creatinine Clearance: 87.5 mL/min (by C-G formula based on SCr of 0.83 mg/dL).   Medical History: Past Medical History:  Diagnosis Date  . Diabetes mellitus without complication (HCC)   . Hypertension   . PE (pulmonary thromboembolism) (HCC) 10/09/2020    Medications:  Scheduled:  . amLODipine  10 mg Oral Daily  . carvedilol  12.5 mg Oral Daily  . Chlorhexidine Gluconate Cloth  6 each Topical Daily  . potassium chloride  40 mEq Oral BID    Assessment: Patient is a 14 yom that presents to the ED with hemoptysis. 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 CT was preformed and showed concerns for covid as well as confirmed a PE. Pharmacy was consulted for Heparin dosing - now with plans to transition to Apixaban.   Heparin level this morning is therapeutic (HL 0.38 << 0.25, goal of 0.3-0.7) after a rate increase earlier today. Hgb 9 - stable from previous, plts wnl. No bleeding noted.   The plan is now to transition to transition to Apixaban -  transition discussed w/ the RN.   Goal of Therapy:  Heparin level 0.3-0.7 units/ml Monitor platelets by anticoagulation protocol: Yes   Plan:  - D/c Heparin - Start Apixaban 10 mg po bid x 7d followed by 5 mg po bid - Will plan to educate prior to discharge  Thank you for allowing pharmacy to be a part of this patient's care.  69, PharmD, BCPS Clinical Pharmacist Clinical phone for 10/10/2020: 10/12/2020 10/10/2020 9:26 AM   **Pharmacist phone directory can now be found on [email or web address] ([email or web address]).  Listed under [facility].

## 2020-10-10 NOTE — Consult Note (Signed)
NAME:  Antonio Nash, MRN:  [id number], DOB:  1956-02-09, LOS: 2 ADMISSION DATE:  10/08/2020, CONSULTATION DATE:  10/10/20 REFERRING MD:  Rebeca Alert  CHIEF COMPLAINT:  PE   Brief History   Antonio Nash is a 65 y.o. male who had recent diagnosis T5-6 discitis / osteo in Jan 2022 with RUE PICC placed for prolonged abx.  Now admitted 2/22 for RUE DVT and small PE.  History of present illness   Antonio Nash is a 65 y.o. male who has a PMH as below.  He presented to [facility] ED 2/21 with dyspnea and back pain.  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  He is currently on IV daptomycin and ceftriaxone and is being followed by Dr. Linus Salmons with ID.  He had RUE PICC placed for prolonged abx (planned through 2/22).  He had CTA in ED that demonstrated acute PE in right lobar, segmental and subsegmental branches with possible left subsegmental involvement.  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  He was admitted by IMTS.  PCCM asked to weigh in on PE.  Past Medical History  has Osteomyelitis ([place]); HTN (hypertension); Type 2 diabetes mellitus without complication ([place]); Discitis of thoracolumbar region; PICC (peripherally inserted central catheter) in place; Medication monitoring encounter; Primary osteoarthritis of left hip; Pulmonary emboli (HCC); DVT (deep venous thrombosis) ([place]); and Fever on their problem list.  Significant Hospital Events   2/22 > admit. 2/23 > PCCM asked to see.  Consults:  ID, PCCM.  Procedures:  None.  Significant Diagnostic Tests:  CTA chest 2/22 > acute PE in right lobar, segmental and subsegmental branches with possible left subsegmental involvement.  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 Echo 2/22 > EF 55-60%, no RV strain. UE Duplex 2/22 > DVT in in R axillary vein, SVT in R cephalic vein.   Micro Data:  Sputum 2/22 >  Respiratory panel 2/22 > neg. COVID 2/22 > neg.  Antimicrobials:  Dapto and Ceftriaxone through 2/25 per  ID.   Interim history/subjective:  Comfortable on room air.  Objective:  Blood pressure 125/71, pulse 92, temperature 100.1 F (37.8 C), temperature source Oral, resp. rate (!) 24, height _0  (1.753 m), weight 82.1 kg, SpO2 97 %.        Intake/Output Summary (Last 24 hours) at 10/10/2020 1253 Last data filed at 10/10/2020 1127 Gross per 24 hour  Intake 1549.54 ml  Output 1450 ml  Net 99.54 ml   Filed Weights   10/08/20 1121  Weight: 82.1 kg    Examination: General: Adult male, in NAD. Neuro: A&O x 3, no deficits. HEENT: /AT. Sclerae anicteric.  EOMI. Cardiovascular: RRR, no M/R/G.  Lungs: Respirations shallow and unlabored.  CTA bilaterally, No W/R/R.  Abdomen: BS x 4, soft, NT/ND.  Musculoskeletal: No gross deformities, no edema.  Skin: Intact, warm, no rashes.  Assessment & Plan:   Acute RUE DVT and small PE - provoked in setting RUE PICC. Bilateral atelectasis - presumed 2/2 splinting?. - Continue heparin gtt. - Agree with DOAC transition. - PICC removal today if done with abx. - No further interventions required.  Fevers - presumed 2/2 above + known T5-6 discitis / osteo. - Per ID and primary team.  Nothing further to add.  PCCM will sign off.  Please do not hesitate to call us back if we can be of any further assistance.    Labs   CBC: Recent Labs  Lab 10/08/20 1059 10/09/20 0122 10/10/20 8338  WBC 12.5* 11.0* 9.5  NEUTROABS 9.5*  --   --   HGB 10.8* 9.4* 9.0*  HCT 35.7* 30.0* 28.5*  MCV 85.2 82.4 81.2  PLT 489* 383 505   Basic Metabolic Panel: Recent Labs  Lab 10/08/20 1059 10/09/20 0041 10/10/20 0326  NA 132* 130* 135  K 4.2 4.1 3.5  CL 96* 101 103  CO2 23 14* 19*  GLUCOSE 112* 87 109*  BUN 24* 20 12  CREATININE 1.37* 1.08 0.83  CALCIUM 8.8* 8.0* 8.2*   GFR: Estimated Creatinine Clearance: 87.5 mL/min (by C-G formula based on SCr of 0.83 mg/dL). Recent Labs  Lab 10/08/20 1059 10/08/20 1257 10/09/20 0122 10/10/20 0326  WBC  12.5*  --  11.0* 9.5  LATICACIDVEN 2.5* 0.7  --   --    Liver Function Tests: Recent Labs  Lab 10/08/20 1059  AST 32  ALT 42  ALKPHOS 42  BILITOT 0.8  PROT 7.9  ALBUMIN 3.0*   No results for input(s): LIPASE, AMYLASE in the last 168 hours. No results for input(s): AMMONIA in the last 168 hours. ABG No results found for: PHART, PCO2ART, PO2ART, HCO3, TCO2, ACIDBASEDEF, O2SAT  Coagulation Profile: Recent Labs  Lab 10/08/20 1237  INR 1.2   Cardiac Enzymes: No results for input(s): CKTOTAL, CKMB, CKMBINDEX, TROPONINI in the last 168 hours. HbA1C: Hgb A1c MFr Bld  Date/Time Value Ref Range Status  09/01/2020 02:14 AM 7.4 (H) 4.8 - 5.6 % Final    Comment:    (NOTE) Pre diabetes:          5.7%-6.4%  Diabetes:              >6.4%  Glycemic control for   <7.0% adults with diabetes    CBG: Recent Labs  Lab 10/09/20 1146 10/09/20 1736 10/09/20 2129 10/10/20 0734 10/10/20 1155  GLUCAP 73 85 75 100* 111*    Review of Systems:   All negative; except for those that are bolded, which indicate positives.  Constitutional: weight loss, weight gain, night sweats, fevers, chills, fatigue, weakness.  HEENT: headaches, sore throat, sneezing, nasal congestion, post nasal drip, difficulty swallowing, tooth/dental problems, visual complaints, visual changes, ear aches. Neuro: difficulty with speech, weakness, numbness, ataxia. CV:  chest pain, orthopnea, PND, swelling in lower extremities, dizziness, palpitations, syncope.  Resp: cough, hemoptysis, dyspnea, wheezing. GI: heartburn, indigestion, abdominal pain, nausea, vomiting, diarrhea, constipation, change in bowel habits, loss of appetite, hematemesis, melena, hematochezia.  GU: dysuria, change in color of urine, urgency or frequency, flank pain, hematuria. MSK: joint pain or swelling, decreased range of motion. Psych: change in mood or affect, depression, anxiety, suicidal ideations, homicidal ideations. Skin: rash, itching,  bruising.   Past medical history  He,  has a past medical history of Diabetes mellitus without complication ([place]), Hypertension, and PE (pulmonary thromboembolism) ([place]) (10/09/2020).   Surgical History   History reviewed. No pertinent surgical history.   Social History   reports that he has never smoked. He has never used smokeless tobacco. He reports previous alcohol use. He reports that he does not use drugs.   Family history   His family history is not on file.   Allergies Allergies  Allergen Reactions  . Codeine Itching    Other reaction(s): Other (See Comments) Skin crawls  . Metformin Diarrhea     Home meds  Prior to Admission medications   Medication Sig Start Date End Date Taking? Authorizing Provider  acetaminophen (TYLENOL) 500 MG tablet Take 500-1,000 mg by mouth every  6 (six) hours as needed for mild pain.   Yes [provider]  amLODipine (NORVASC) 10 MG tablet Take 10 mg by mouth daily.   Yes [provider]  carvedilol (COREG) 12.5 MG tablet Take 12.5 mg by mouth daily. 08/03/17  Yes [provider]  cefTRIAXone (ROCEPHIN) IVPB Inject 2 g into the vein daily. Indication:  Ostemyelitis First Dose: No Last Day of Therapy:  10/12/2020 Labs - Once weekly:  CBC/D and BMP, Labs - Every other week:  ESR and CRP Method of administration: IV Push Method of administration may be changed at the discretion of home infusion pharmacist based upon assessment of the patient and/or caregiver's ability to self-administer the medication ordered. 09/03/20 10/13/20 Yes Aline August, MD  daptomycin (CUBICIN) IVPB Inject 750 mg into the vein daily. Indication:  Osteomyelitis  First Dose: no  Last Day of Therapy:  10/12/2020 Labs - Once weekly:  CBC/D, BMP, and CPK Labs - Every other week:  ESR and CRP Method of administration: IV Push Method of administration may be changed at the discretion of home infusion pharmacist based upon assessment of the patient  and/or caregiver's ability to self-administer the medication ordered. Patient taking differently: Inject 750 mg into the vein daily. Indication:  Osteomyelitis  First Dose: no  Last Day of Therapy:  10/12/2020 Labs - Once weekly:  CBC/D, BMP, and CPK Labs - Every other week:  ESR and CRP Method of administration: IV Push Method of administration may be changed at the discretion of home infusion pharmacist based upon assessment of the patient and/or caregiver's ability to self-administer the medication ordered. 09/04/20 10/12/20 Yes Aline August, MD  diphenhydrAMINE (BENADRYL) 25 MG tablet Take 25 mg by mouth every 6 (six) hours as needed for allergies.   Yes [provider]  empagliflozin (JARDIANCE) 25 MG TABS tablet Take 25 mg by mouth daily.   Yes [provider]  glimepiride (AMARYL) 4 MG tablet Take 4 mg by mouth daily with breakfast.   Yes [provider]  lisinopril (ZESTRIL) 40 MG tablet Take 40 mg by mouth daily. 08/03/17  Yes [provider]  methocarbamol (ROBAXIN) 500 MG tablet Take 500 mg by mouth every 6 (six) hours as needed for muscle spasms. 10/07/20  Yes [provider]  traMADol (ULTRAM) 50 MG tablet Take 1 tablet (50 mg total) by mouth 3 (three) times daily as needed. Patient taking differently: Take 50 mg by mouth 3 (three) times daily as needed for moderate pain. 10/02/20  Yes Aundra Dubin, PA-C     Montey Hora, [facility] For pager details, please see AMION If no response to pager, please call [phone] until 7:00 PM After 7:00 PM, please call Elink at [phone] 10/10/2020, 12:53 PM

## 2020-10-10 NOTE — Progress Notes (Signed)
HD#2 Subjective:  Overnight Events:    Patient is seen at bedside. Patient reports some sweating with fever last night. Dislikes hospital food and not eating much.  Objective:  Vital signs in last 24 hours: Vitals:   10/09/20 2124 10/10/20 0215 10/10/20 0328 10/10/20 0613  BP: (!) 105/56 (!) 110/54 (!) 109/57 (!) 101/57  Pulse: 92 96 94 87  Resp: Temp: 99.7 F (37.6 C) (!) 102.2 F (39 C) 99 F (37.2 C) 99.5 F (37.5 C)  TempSrc: Oral Oral Oral Oral  SpO2: 95% 96% 96% 97%  Weight:      Height:       Supplemental O2: Nasal Cannula SpO2: 97 % O2 Flow Rate (L/min): 2 L/min   Physical Exam:  Physical Exam Constitutional:      Appearance: Normal appearance.  HENT:     Head: Normocephalic and atraumatic.     Right Ear: External ear normal.     Left Ear: External ear normal.     Mouth/Throat:     Mouth: Mucous membranes are moist.     Pharynx: Oropharynx is clear.  Eyes:     Extraocular Movements: Extraocular movements intact.     Conjunctiva/sclera: Conjunctivae normal.     Pupils: Pupils are equal, round, and reactive to light.  Cardiovascular:     Rate and Rhythm: Normal rate and regular rhythm.     Pulses: Normal pulses.     Heart sounds: Normal heart sounds.  Pulmonary:     Effort: Pulmonary effort is normal.     Breath sounds: Rhonchi (bilateral) present.  Abdominal:     General: Abdomen is flat. Bowel sounds are normal.     Palpations: Abdomen is soft.  Musculoskeletal:     Right lower leg: No edema.     Left lower leg: No edema.  Skin:    General: Skin is warm and dry.     Capillary Refill: Capillary refill takes less than 2 seconds.  Neurological:     General: No focal deficit present.     Mental Status: He is alert and oriented to person, place, and time. Mental status is at baseline.    Filed Weights   10/08/20 1121  Weight: 82.1 kg     Intake/Output Summary (Last 24 hours) at 10/10/2020 0626 Last data filed at 10/10/2020  1610 Gross per 24 hour  Intake 1598.34 ml  Output 1450 ml  Net 148.34 ml   Net IO Since Admission: 114.4 mL [10/10/20 0626]  Pertinent Labs: CBC Latest Ref Rng & Units 10/10/2020 10/09/2020 10/08/2020  WBC 4.0 - 10.5 K/uL 9.5 11.0(H) 12.5(H)  Hemoglobin 13.0 - 17.0 g/dL 9.0(L) 9.4(L) 10.8(L)  Hematocrit 39.0 - 52.0 % 28.5(L) 30.0(L) 35.7(L)  Platelets 150 - 400 K/uL 370 383 489(H)    CMP Latest Ref Rng & Units 10/10/2020 10/09/2020 10/08/2020  Glucose 70 - 99 mg/dL 960(A) 87 540(J)  BUN 8 - 23 mg/dL 12 20 81(X)  Creatinine 0.61 - 1.24 mg/dL 9.14 7.82 9.56(O)  Sodium 135 - 145 mmol/L 135 130(L) 132(L)  Potassium 3.5 - 5.1 mmol/L 3.5 4.1 4.2  Chloride 98 - 111 mmol/L 103 101 96(L)  CO2 22 - 32 mmol/L 19(L) 14(L) 23  Calcium 8.9 - 10.3 mg/dL 8.2(L) 8.0(L) 8.8(L)  Total Protein 6.5 - 8.1 g/dL - - 7.9  Total Bilirubin 0.3 - 1.2 mg/dL - - 0.8  Alkaline Phos 38 - 126 U/L - - 42  AST 15 - 41  U/L - - 32  ALT 0 - 44 U/L - - 42   Imaging: ECHOCARDIOGRAM COMPLETE  Result Date: 10/09/2020    ECHOCARDIOGRAM REPORT   Patient Name:   Antonio Nash Date of Exam: 10/09/2020 Medical Rec #:  [id number]       Height:       69.0 in Accession #:    [id number]      Weight:       181.0 lb Date of Birth:  1955-10-11      BSA:          1.980 m Patient Age:    65 years        BP:           122/62 mmHg Patient Gender: M               HR:           102 bpm. Exam Location:  Inpatient Procedure: 2D Echo, Color Doppler and Cardiac Doppler Indications:    Pulmonary Embolus 415.19 / I26.99  History:        Patient has no prior history of Echocardiogram examinations.                 Risk Factors:Hypertension and Diabetes.  Sonographer:    Eulah Pont RDCS Referring Phys: [id number] Elwin Mocha RAINES IMPRESSIONS  1. Left ventricular ejection fraction, by estimation, is 55 to 60%. The left ventricle has normal function. The left ventricle has no regional wall motion abnormalities. Left ventricular diastolic parameters were  normal.  2. Right ventricular systolic function is normal. The right ventricular size is normal. There is moderately elevated pulmonary artery systolic pressure.  3. Left atrial size was mildly dilated.  4. Right atrial size was mildly dilated.  5. The mitral valve is normal in structure. Trivial mitral valve regurgitation. No evidence of mitral stenosis.  6. The aortic valve is tricuspid. Aortic valve regurgitation is not visualized. No aortic stenosis is present.  7. The inferior vena cava is normal in size with greater than 50% respiratory variability, suggesting right atrial pressure of 3 mmHg. Comparison(s): No prior Echocardiogram. Conclusion(s)/Recommendation(s): Normal biventricular function without evidence of hemodynamically significant valvular heart disease. FINDINGS  Left Ventricle: Left ventricular ejection fraction, by estimation, is 55 to 60%. The left ventricle has normal function. The left ventricle has no regional wall motion abnormalities. The left ventricular internal cavity size was normal in size. There is  no left ventricular hypertrophy. Left ventricular diastolic parameters were normal. Right Ventricle: The right ventricular size is normal. No increase in right ventricular wall thickness. Right ventricular systolic function is normal. There is moderately elevated pulmonary artery systolic pressure. The tricuspid regurgitant velocity is 3.26 m/s, and with an assumed right atrial pressure of 3 mmHg, the estimated right ventricular systolic pressure is 45.5 mmHg. Left Atrium: Left atrial size was mildly dilated. Right Atrium: Right atrial size was mildly dilated. Pericardium: There is no evidence of pericardial effusion. Mitral Valve: The mitral valve is normal in structure. Trivial mitral valve regurgitation. No evidence of mitral valve stenosis. Tricuspid Valve: The tricuspid valve is normal in structure. Tricuspid valve regurgitation is mild . No evidence of tricuspid stenosis. Aortic  Valve: The aortic valve is tricuspid. Aortic valve regurgitation is not visualized. No aortic stenosis is present. Pulmonic Valve: The pulmonic valve was grossly normal. Pulmonic valve regurgitation is not visualized. No evidence of pulmonic stenosis. Aorta: The aortic root, ascending aorta, aortic arch and descending aorta are all  structurally normal, with no evidence of dilitation or obstruction. Venous: The inferior vena cava is normal in size with greater than 50% respiratory variability, suggesting right atrial pressure of 3 mmHg. IAS/Shunts: The atrial septum is grossly normal.  LEFT VENTRICLE PLAX 2D LVIDd:         5.90 cm     Diastology LVIDs:         3.00 cm     LV e' medial:    7.94 cm/s LV PW:         0.70 cm     LV E/e' medial:  13.0 LV IVS:        0.60 cm     LV e' lateral:   12.30 cm/s LVOT diam:     2.20 cm     LV E/e' lateral: 8.4 LV SV:         69 LV SV Index:   35 LVOT Area:     3.80 cm  LV Volumes (MOD) LV vol d, MOD A2C: 99.7 ml LV vol d, MOD A4C: 85.5 ml LV vol s, MOD A2C: 40.3 ml LV vol s, MOD A4C: 42.1 ml LV SV MOD A2C:     59.4 ml LV SV MOD A4C:     85.5 ml LV SV MOD BP:      54.6 ml RIGHT VENTRICLE RV S prime:     16.20 cm/s TAPSE (M-mode): 1.8 cm LEFT ATRIUM             Index       RIGHT ATRIUM           Index LA diam:        4.00 cm 2.02 cm/m  RA Area:     15.70 cm LA Vol (A2C):   58.7 ml 29.64 ml/m RA Volume:   39.70 ml  20.05 ml/m LA Vol (A4C):   57.4 ml 28.99 ml/m LA Biplane Vol: 62.8 ml 31.71 ml/m  AORTIC VALVE LVOT Vmax:   108.00 cm/s LVOT Vmean:  72.400 cm/s LVOT VTI:    0.182 m  AORTA Ao Root diam: 3.00 cm Ao Asc diam:  2.90 cm MITRAL VALVE                TRICUSPID VALVE MV Area (PHT): 2.82 cm     TR Peak grad:   42.5 mmHg MV Decel Time: 269 msec     TR Vmax:        326.00 cm/s MV E velocity: 103.00 cm/s MV A velocity: 91.20 cm/s   SHUNTS MV E/A ratio:  1.13         Systemic VTI:  0.18 m                             Systemic Diam: 2.20 cm Jodelle Red MD  Electronically signed by Jodelle Red MD Signature Date/Time: 10/09/2020/7:05:00 PM    Final    VAS [place] UPPER EXTREMITY VENOUS DUPLEX  Result Date: 10/09/2020 UPPER VENOUS STUDY  Indications: Acute pulmonary embolism, right arm PICC line. Anticoagulation: Heparin. Limitations: Bandaging at PICC line insertion site. Comparison Study: 10-08-2020 CTA chest was positive for acute pulmonary                   embolism. Performing Technologist: Jean Rosenthal RDMS  Examination Guidelines: A complete evaluation includes B-mode imaging, spectral Doppler, color Doppler, and power Doppler as needed of all accessible portions of each vessel. Bilateral testing is  considered an integral part of a complete examination. Limited examinations for reoccurring indications may be performed as noted.  Right Findings: +----------+------------+---------+-----------+----------+---------------------+ RIGHT     CompressiblePhasicitySpontaneousProperties       Summary        +----------+------------+---------+-----------+----------+---------------------+ IJV           Full       Yes       Yes                                    +----------+------------+---------+-----------+----------+---------------------+ Subclavian    Full       Yes       Yes                                    +----------+------------+---------+-----------+----------+---------------------+ Axillary      None       No        No                                     +----------+------------+---------+-----------+----------+---------------------+ Brachial      Full                                    Proximal and mid                                                            segments not                                                            visualized due to                                                               PICC          +----------+------------+---------+-----------+----------+---------------------+  Radial        Full                                                        +----------+------------+---------+-----------+----------+---------------------+ Ulnar         Full                                                        +----------+------------+---------+-----------+----------+---------------------+ Cephalic      None  No        No                 Age Indeterminate   +----------+------------+---------+-----------+----------+---------------------+ Basilic                                                Not visualized     +----------+------------+---------+-----------+----------+---------------------+  Left Findings: +----------+------------+---------+-----------+----------+-------+ LEFT      CompressiblePhasicitySpontaneousPropertiesSummary +----------+------------+---------+-----------+----------+-------+ Subclavian    Full       Yes       Yes                      +----------+------------+---------+-----------+----------+-------+  Summary:  Right: Findings consistent with acute deep vein thrombosis involving the right axillary vein. Findings consistent with age indeterminate superficial vein thrombosis involving the right cephalic vein.  Left: No evidence of thrombosis in the subclavian.  *See table(s) above for measurements and observations.  Diagnosing physician: Waverly Ferrari MD Electronically signed by Waverly Ferrari MD on 10/09/2020 at 6:28:56 PM.    Final     Assessment/Plan:   Principal Problem:   Pulmonary emboli (HCC) Active Problems:   Osteomyelitis (HCC)   Type 2 diabetes mellitus without complication (HCC)   Discitis of thoracolumbar region   PICC (peripherally inserted central catheter) in place   DVT (deep venous thrombosis) (HCC)   Fever  Patient Summary: Antonio Nash is a 65 y/o male with a past medical history of hypertension, diabetes, T5-6 discitis/osteomyletis currently on IV daptomycin and ceftriaxone presents for  worsening back pain and shortness of breath for the past 2 days found to have right pulmonary emboli.  Provoked PE in setting on acute infection and RUE DVT 2/2 to PICC line Patient feels breathing is better and no longer having a cough. DVT study with right axillary DVT. PE likely provoked in setting of acute infection and PICC line. Echo without significant right heart strain.  Continue on heparin drip. Consider starting oral if he continues to improve will need anticoagulation for 3 months.  -Continue Heparin gtt - O2 as need to maintain saturation >90%, - Monitor on tele  Intermitted Fevers Continues to be intermittently febrile.  Consolidation on CT concerning for pulmonary etiology. Sputum culture gram stain with multiple organisms. Back pain much improved, discussed with ID, who feel his new fevers are unlikely due to his osteomyelitis. Recommended discussing with pulmonary given bilateral lung consolidations on CT. - Pulmonary consulted, appreciated reccomendations  - follow up respiratory viral panel - sputum culture - Trend fever curve - Follow up blood cultures  T5-T6 Discitis/Osteomyelitis  Today he states back pain is much bette.r Prior MRI c/w T5-6 discitis/osteomyelitis. After E. Coli UTI in 04/2020. Admitted between 08/31/2020 and 09/03/2020 for this ad a paraspinal biopsy done at that time, cultures negative. Discharged on IV daptomycin and Ceftriaxone until 10/09/2020. CRP elevated to 18. ID recommending continued antibiotics for 2 more weeks and trending inflammatory markers. Will hold off on repeat imaging unless he continues to have fevers. - ID consulted, appreciate reccomendations - Continue daptomycin and ceftriaxone -Tramadol 50 mg three time daily  PRN, acetaminophen every 6 hours PRN.  Hypoglycemia  Diabetes mellitus type 2 Diet controlled, Last A1c of 7.4. No further episodes of hypoglycemia. CBGs in 70-80s. Not been eating much, states he does not like the food at the  hospital.  Encouraged PO intake. - Monitor CBGs  Hypertension - Bps have been soft overnight - Continue home regimen of Coreg - Hold lisinopril.   - Monitor blood pressure  Diet: Heart Healthy Carb modified IVF: None,s/p 1L bolus VTE: Heparin gtt Code: Full  Dispo: Anticipated discharge to Pending in  days pending further mangement.   Quincy Simmonds, MD 10/10/2020, 6:26 AM Pager: [phone]  Please contact the on call pager after 5 pm and on weekends at [phone].

## 2020-10-10 NOTE — Progress Notes (Addendum)
[facility] Infectious Diseases Follow Up Note  Patient Identification: Patient Name: Antonio Nash MRN: [id number] [place] Date: 10/08/2020 10:38 AM Age: 65 y.o.Today's Date: 10/10/2020   Reason for Visit: Fevers   Principal Problem:   Pulmonary emboli [facility]) Active Problems:   Osteomyelitis ([place])   Type 2 diabetes mellitus without complication (HCC)   Discitis of thoracolumbar region   PICC (peripherally inserted central catheter) in place   DVT (deep venous thrombosis) (HCC)   Fever  Antibiotics: Daptomycin                    Ceftriaxone   Lines/Tubes: Rt arm PICC line, PIVs   Interval Events: continues to be febrile however, no leukocytosis and no new symptoms concerning for infection    Assessment # 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 Denies any risk factors for TB ( [place] born, no travel to [place] or af[place] countries, no TB contact, Denies IVDU, being homeless or incarcerated, no family members with TB) -No known immunocompromised conditions to suspect OIs at this time  # Acute Pulmonary Emboli ( right lobar, segmental, and subsegmental branches. Possible left subsegmental involvement. Borderline right heart strain. Borderline right heart strain. Bilateral peripheral areas of consolidation and atelectasis) Sars cov2 PCR is negative  Influenza A and B negative resp PCR panel is negative  On Anticoagulation   Recommendations His CRP was elevated from 8 to 18, would recommend to continue  Daptomycin and ceftriaxone for 2 more weeks from 2/22.  Weekly ESR and CRP Check CBC with Diff Fu blood cultures Agree with getting Pulmonology evaluation for the abnormal Chest CT findings Fever peak is downtrending. If continues to have fever with no clear infective cause, will plan to image his back Can get a repeat SARScov2 PCR   Rest of the management as per the primary team. Thank you for the consult. Please page with pertinent questions or concerns.  ______________________________________________________________________ Subjective patient seen and examined at the bedside. Cough is better, back pain is stable   Vitals BP 125/71 (BP Location: Left Arm)   Pulse 92   Temp 100.1 F (37.8 C) (Oral) Comment: tylenol given  Resp (!) 24   Ht '5\' 9"'  (1.753 m)   Wt 82.1 kg   SpO2 97%   BMI 26.73 kg/m     Physical Exam Average built male sitting up in bed [place] Chest - rales + CVS- Normal s1s2, RRR  Abdomen - soft, NT, BS+ Extremities - bilateral LE power 5/5 Skin - no rashes  Back - no tenderness in the vertebra   Pertinent Microbiology Results for orders placed or performed during the hospital encounter of 10/08/20  Resp Panel by RT-PCR (Flu A&B, Covid) Nasopharyngeal Swab     Status: None   Collection Time: 10/08/20 12:39 PM   Specimen: Nasopharyngeal Swab; Nasopharyngeal(NP) swabs in vial transport medium  Result Value Ref Range Status   SARS Coronavirus 2 by RT PCR NEGATIVE NEGATIVE Final    Comment: (NOTE) SARS-CoV-2 target nucleic acids are NOT DETECTED.  The SARS-CoV-2 RNA is generally detectable  in upper respiratory specimens during the acute phase of infection. The lowest concentration of SARS-CoV-2 viral copies this assay can detect is 138 copies/mL. A negative result does not preclude SARS-Cov-2 infection and should not be used as the sole basis for treatment or other patient management decisions. A negative result may occur with  improper specimen  collection/handling, submission of specimen other than nasopharyngeal swab, presence of viral mutation(s) within the areas targeted by this assay, and inadequate number of viral copies(<138 copies/mL). A negative result must be combined with clinical observations, patient history, and epidemiological information. The expected result is Negative.  Fact Sheet for Patients:  EntrepreneurPulse.com.au  Fact Sheet for Healthcare Providers:  IncredibleEmployment.be  This test is no t yet approved or cleared by the [place] FDA and  has been authorized for detection and/or diagnosis of SARS-CoV-2 by FDA under an Emergency Use Authorization (EUA). This EUA will remain  in effect (meaning this test can be used) for the duration of the COVID-19 declaration under Section 564(b)(1) of the Act, 21 U.S.C.section 360bbb-3(b)(1), unless the authorization is terminated  or revoked sooner.       Influenza A by PCR NEGATIVE NEGATIVE Final   Influenza B by PCR NEGATIVE NEGATIVE Final    Comment: (NOTE) The Xpert Xpress SARS-CoV-2/FLU/RSV plus assay is intended as an aid in the diagnosis of influenza from Nasopharyngeal swab specimens and should not be used as a sole basis for treatment. Nasal washings and aspirates are unacceptable for Xpert Xpress SARS-CoV-2/FLU/RSV testing.  Fact Sheet for Patients: EntrepreneurPulse.com.au  Fact Sheet for Healthcare Providers: IncredibleEmployment.be  This test is not yet approved or cleared by the [place] FDA and has been authorized for detection and/or diagnosis of SARS-CoV-2 by FDA under an Emergency Use Authorization (EUA). This EUA will remain in effect (meaning this test can be used) for the duration of the COVID-19 declaration under Section 564(b)(1) of the Act, 21 U.S.C. section 360bbb-3(b)(1), unless the authorization is terminated or revoked.  Performed at [facility], [place] [address] [place], [place] [address]   Culture, blood (routine x 2)     Status: None (Preliminary result)   Collection Time: 10/09/20  6:20 PM   Specimen: BLOOD  Result Value Ref Range Status   Specimen Description BLOOD WRIST RIGHT  Final   Special Requests   Final    BOTTLES DRAWN AEROBIC AND ANAEROBIC Blood Culture adequate volume   Culture   Final    NO GROWTH < 12 HOURS Performed at [facility], [place] [address], [place], [place] [address]    Report Status PENDING  Incomplete  Culture, blood (routine x 2)     Status: None (Preliminary result)   Collection Time: 10/09/20  6:25 PM   Specimen: BLOOD  Result Value Ref Range Status   Specimen Description BLOOD LEFT WRIST  Final   Special Requests   Final    BOTTLES DRAWN AEROBIC AND ANAEROBIC Blood Culture adequate volume   Culture   Final    NO GROWTH < 12 HOURS Performed at [facility], [place] [address] [place], [place] [address]    Report Status PENDING  Incomplete  Expectorated Sputum Assessment w Gram Stain, Rflx to Resp Cult     Status: None   Collection Time: 10/09/20 10:08 PM   Specimen: Sputum  Result Value Ref Range Status   Specimen Description SPUTUM  Final   Special Requests NONE  Final   Sputum evaluation  Final    THIS SPECIMEN IS ACCEPTABLE FOR SPUTUM CULTURE Performed at [facility], [place] [address] [place], [place] [address]    Report Status 10/09/2020 FINAL  Final  Culture, Respiratory w Gram Stain     Status: None (Preliminary result)   Collection Time: 10/09/20 10:08 PM   Specimen: SPU  Result Value Ref Range Status   Specimen Description SPUTUM  Final   Special Requests NONE Reflexed from [id number]  Final   Gram Stain   Final    ABUNDANT WBC PRESENT, PREDOMINANTLY PMN ABUNDANT GRAM POSITIVE COCCI ABUNDANT GRAM NEGATIVE RODS MODERATE YEAST FEW GRAM POSITIVE RODS    Culture   Final    TOO YOUNG TO READ Performed at [facility], [place] [address],  [place], [place] [address]    Report Status PENDING  Incomplete  Respiratory (~20 pathogens) panel by PCR     Status: None   Collection Time: 10/10/20  6:34 AM   Specimen: Nasopharyngeal Swab; Respiratory  Result Value Ref Range Status   Adenovirus NOT DETECTED NOT DETECTED Final   Coronavirus 229E NOT DETECTED NOT DETECTED Final    Comment: (NOTE) The Coronavirus on the Respiratory Panel, DOES NOT test for the novel  Coronavirus (2019 nCoV)    Coronavirus HKU1 NOT DETECTED NOT DETECTED Final   Coronavirus NL63 NOT DETECTED NOT DETECTED Final   Coronavirus OC43 NOT DETECTED NOT DETECTED Final   Metapneumovirus NOT DETECTED NOT DETECTED Final   Rhinovirus / Enterovirus NOT DETECTED NOT DETECTED Final   Influenza A NOT DETECTED NOT DETECTED Final   Influenza B NOT DETECTED NOT DETECTED Final   Parainfluenza Virus 1 NOT DETECTED NOT DETECTED Final   Parainfluenza Virus 2 NOT DETECTED NOT DETECTED Final   Parainfluenza Virus 3 NOT DETECTED NOT DETECTED Final   Parainfluenza Virus 4 NOT DETECTED NOT DETECTED Final   Respiratory Syncytial Virus NOT DETECTED NOT DETECTED Final   Bordetella pertussis NOT DETECTED NOT DETECTED Final   Bordetella Parapertussis NOT DETECTED NOT DETECTED Final   Chlamydophila pneumoniae NOT DETECTED NOT DETECTED Final   Mycoplasma pneumoniae NOT DETECTED NOT DETECTED Final    Comment: Performed at [facility], [place]. [address] [place], [place] [address]    Pertinent Lab. CMP Latest Ref Rng & Units 10/10/2020 10/09/2020 10/08/2020  Glucose 70 - 99 mg/dL 109(H) 87 112(H)  BUN 8 - 23 mg/dL 12 20 24(H)  Creatinine 0.61 - 1.24 mg/dL 0.83 1.08 1.37(H)  Sodium 135 - 145 mmol/L 135 130(L) 132(L)  Potassium 3.5 - 5.1 mmol/L 3.5 4.1 4.2  Chloride 98 - 111 mmol/L 103 101 96(L)  CO2 22 - 32 mmol/L 19(L) 14(L) 23  Calcium 8.9 - 10.3 mg/dL 8.2(L) 8.0(L) 8.8(L)  Total Protein 6.5 - 8.1 g/dL - - 7.9  Total Bilirubin 0.3 - 1.2 mg/dL - - 0.8  Alkaline Phos 38 - 126 U/L -  - 42  AST 15 - 41 U/L - - 32  ALT 0 - 44 U/L - - 42     Pertinent Imaging today Plain films and CT images have been personally visualized and interpreted; radiology reports have been reviewed. Decision making incorporated into the Impression / Recommendations.  I have spent approx 30 minutes for this patient encounter including review of prior medical records with greater than 50% of time being face to face and coordination of their care.  Electronically signed by:   Rosiland Oz, MD Infectious Disease Physician [facility] for  Infectious Disease Pager: [phone]

## 2020-10-11 ENCOUNTER — Encounter (HOSPITAL_COMMUNITY): Payer: Self-pay | Admitting: Internal Medicine

## 2020-10-11 DIAGNOSIS — I2699 Other pulmonary embolism without acute cor pulmonale: Secondary | ICD-10-CM

## 2020-10-11 DIAGNOSIS — R509 Fever, unspecified: Secondary | ICD-10-CM | POA: Diagnosis not present

## 2020-10-11 DIAGNOSIS — M4645 Discitis, unspecified, thoracolumbar region: Secondary | ICD-10-CM | POA: Diagnosis not present

## 2020-10-11 LAB — LUPUS ANTICOAGULANT PANEL
DRVVT: 53.4 s — ABNORMAL HIGH (ref 0.0–47.0)
PTT Lupus Anticoagulant: 62.8 s — ABNORMAL HIGH (ref 0.0–51.9)

## 2020-10-11 LAB — CBC WITH DIFFERENTIAL/PLATELET
Abs Immature Granulocytes: 0.09 10*3/uL — ABNORMAL HIGH (ref 0.00–0.07)
Basophils Absolute: 0 10*3/uL (ref 0.0–0.1)
Basophils Relative: 0 %
Eosinophils Absolute: 0.3 10*3/uL (ref 0.0–0.5)
Eosinophils Relative: 3 %
HCT: 30 % — ABNORMAL LOW (ref 39.0–52.0)
Hemoglobin: 9.3 g/dL — ABNORMAL LOW (ref 13.0–17.0)
Immature Granulocytes: 1 %
Lymphocytes Relative: 13 %
Lymphs Abs: 1.2 10*3/uL (ref 0.7–4.0)
MCH: 25.7 pg — ABNORMAL LOW (ref 26.0–34.0)
MCHC: 31 g/dL (ref 30.0–36.0)
MCV: 82.9 fL (ref 80.0–100.0)
Monocytes Absolute: 1.1 10*3/uL — ABNORMAL HIGH (ref 0.1–1.0)
Monocytes Relative: 11 %
Neutro Abs: 6.8 10*3/uL (ref 1.7–7.7)
Neutrophils Relative %: 72 %
Platelets: 469 10*3/uL — ABNORMAL HIGH (ref 150–400)
RBC: 3.62 MIL/uL — ABNORMAL LOW (ref 4.22–5.81)
RDW: 14.1 % (ref 11.5–15.5)
WBC: 9.5 10*3/uL (ref 4.0–10.5)
nRBC: 0 % (ref 0.0–0.2)

## 2020-10-11 LAB — DRVVT CONFIRM: dRVVT Confirm: 1.2 ratio (ref 0.8–1.2)

## 2020-10-11 LAB — BASIC METABOLIC PANEL
Anion gap: 12 (ref 5–15)
BUN: 9 mg/dL (ref 8–23)
CO2: 21 mmol/L — ABNORMAL LOW (ref 22–32)
Calcium: 8.3 mg/dL — ABNORMAL LOW (ref 8.9–10.3)
Chloride: 100 mmol/L (ref 98–111)
Creatinine, Ser: 0.75 mg/dL (ref 0.61–1.24)
GFR, Estimated: >60 mL/min (ref >60)
Glucose, Bld: 141 mg/dL — ABNORMAL HIGH (ref 70–99)
Potassium: 3.8 mmol/L (ref 3.5–5.1)
Sodium: 133 mmol/L — ABNORMAL LOW (ref 135–145)

## 2020-10-11 LAB — DRVVT MIX: dRVVT Mix: 45.6 s — ABNORMAL HIGH (ref 0.0–40.4)

## 2020-10-11 LAB — CARDIOLIPIN ANTIBODIES, IGG, IGM, IGA
Anticardiolipin IgA: <9 APL U/mL (ref 0–11)
Anticardiolipin IgG: <9 GPL U/mL (ref 0–14)
Anticardiolipin IgM: <9 MPL U/mL (ref 0–12)

## 2020-10-11 LAB — SARS CORONAVIRUS 2 (TAT 6-24 HRS): SARS Coronavirus 2: NEGATIVE

## 2020-10-11 LAB — GLUCOSE, CAPILLARY
Glucose-Capillary: 131 mg/dL — ABNORMAL HIGH (ref 70–99)
Glucose-Capillary: 147 mg/dL — ABNORMAL HIGH (ref 70–99)
Glucose-Capillary: 181 mg/dL — ABNORMAL HIGH (ref 70–99)
Glucose-Capillary: 186 mg/dL — ABNORMAL HIGH (ref 70–99)

## 2020-10-11 LAB — HEXAGONAL PHASE PHOSPHOLIPID: Hexagonal Phase Phospholipid: 2 s (ref 0–11)

## 2020-10-11 LAB — PTT-LA MIX: PTT-LA Mix: 63 s — ABNORMAL HIGH (ref 0.0–48.9)

## 2020-10-11 LAB — CK: Total CK: 88 U/L (ref 49–397)

## 2020-10-11 NOTE — Progress Notes (Addendum)
HD#3 Subjective:  Overnight Events:    Patient is seen at bedside with his wife. Patient reports feeling well. Endorses back pain. Report blood sputum, no bloody sputum yesterday. Wife reports that patient is dyspneic this morning with walking, better with coughing and bring up sputum.   Wife concerned about possibility of cancer.   Objective:  Vital signs in last 24 hours: Vitals:   10/10/20 1718 10/10/20 1824 10/10/20 2106 10/11/20 0524  BP: 128/67  119/68 120/83  Pulse: 92  90 97  Resp: 20  19 18   Temp: (!) 101.2 F (38.4 C) (!) 100.9 F (38.3 C) 99 F (37.2 C) 99.6 F (37.6 C)  TempSrc: Oral Oral Oral Oral  SpO2: 91%  91% 93%  Weight:      Height:       Supplemental O2: Nasal Cannula SpO2: 93 % O2 Flow Rate (L/min): 2 L/min   Physical Exam:  Physical Exam Constitutional:      Appearance: Normal appearance.  HENT:     Head: Normocephalic and atraumatic.     Right Ear: External ear normal.     Left Ear: External ear normal.     Mouth/Throat:     Mouth: Mucous membranes are moist.     Pharynx: Oropharynx is clear.  Eyes:     Extraocular Movements: Extraocular movements intact.     Conjunctiva/sclera: Conjunctivae normal.     Pupils: Pupils are equal, round, and reactive to light.  Cardiovascular:     Rate and Rhythm: Normal rate and regular rhythm.     Pulses: Normal pulses.     Heart sounds: Normal heart sounds.  Pulmonary:     Effort: Pulmonary effort is normal.     Breath sounds: Rhonchi: bilateral.  Abdominal:     General: Abdomen is flat. Bowel sounds are normal.     Palpations: Abdomen is soft.  Musculoskeletal:     Right lower leg: No edema.     Left lower leg: No edema.  Skin:    General: Skin is warm and dry.     Capillary Refill: Capillary refill takes less than 2 seconds.  Neurological:     General: No focal deficit present.     Mental Status: He is alert and oriented to person, place, and time. Mental status is at baseline.    Filed  Weights   10/08/20 1121  Weight: 82.1 kg     Intake/Output Summary (Last 24 hours) at 10/11/2020 0631 Last data filed at 10/11/2020 0524 Gross per 24 hour  Intake 1075.8 ml  Output 1150 ml  Net -74.2 ml   Net IO Since Admission: 40.2 mL [10/11/20 0631]  Pertinent Labs: CBC Latest Ref Rng & Units 10/11/2020 10/10/2020 10/09/2020  WBC 4.0 - 10.5 K/uL 9.5 9.5 11.0(H)  Hemoglobin 13.0 - 17.0 g/dL 10/11/2020) 3.8(B) 0.1(B)  Hematocrit 39.0 - 52.0 % 30.0(L) 28.5(L) 30.0(L)  Platelets 150 - 400 K/uL 469(H) 370 383    CMP Latest Ref Rng & Units 10/11/2020 10/10/2020 10/09/2020  Glucose 70 - 99 mg/dL 10/11/2020) 258(N) 87  BUN 8 - 23 mg/dL 9 12 20   Creatinine 0.61 - 1.24 mg/dL 277(O 2.42  Sodium 135 - 145 mmol/L 133(L) 135 130(L)  Potassium 3.5 - 5.1 mmol/L 3.8 3.5 4.1  Chloride 98 - 111 mmol/L 100 103 101  CO2 22 - 32 mmol/L 21(L) 19(L) 14(L)  Calcium 8.9 - 10.3 mg/dL 8.3(L) 8.2(L) 8.0(L)  Total Protein 6.5 - 8.1 g/dL - - -  Total Bilirubin 0.3 - 1.2 mg/dL - - -  Alkaline Phos 38 - 126 U/L - - -  AST 15 - 41 U/L - - -  ALT 0 - 44 U/L - - -   Imaging: No results found.  Assessment/Plan:   Principal Problem:   Pulmonary emboli (HCC) Active Problems:   Osteomyelitis (HCC)   Type 2 diabetes mellitus without complication (HCC)   Discitis of thoracolumbar region   PICC (peripherally inserted central catheter) in place   DVT (deep venous thrombosis) (HCC)   Fever  Patient Summary: Antonio Nash is a 65 y/o male with a past medical history of hypertension, diabetes, T5-6 discitis/osteomyletis currently on IV daptomycin and ceftriaxone presents for worsening back pain and shortness of breath for the past 2 days found to have right pulmonary emboli.  Provoked PE in setting on acute infection and RUE DVT 2/2 to PICC line Reports one episode of bloody sputum this morning. Slight cough with plueritic chest pain on the right side. No on room air.Transitioned to oral anticoagulation on 10/10/2020  with apixban. Doing well from PE. Consider repeat imaging if worsening hemoptysis. Will remove PICC line today due to axillary vein DVT, and replace closer to discharge. - Continue apixiban - O2 as need to maintain saturation >90%, - Monitor on tele - PT ordered  Intermitted Fevers Possible pneumonia Continues to be intermittently febrile to 101 F overnight. Another episode of bloody sputum this morning and continued night sweates. [place] prior travel [place], [place] in the past. 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 RVP negative, repeat COVID test negative, sputum and cultures with no growth to date. No clear source for this fever. Evaluated by pulmonary recommended doxycycline to cover for atypical pneumonia given CT findings and outpatient follow up. Will continue 2 more week of IV antibiotics of osteomyelitis. Low risk for exposure to TB but will check quantiferon gold.  - Continue doxy, dapato, and rocephin - follow up blood and sputum cultures - Quantiferon Gold ordered - Trend fever curve fever  T5-T6 Discitis/Osteomyelitis  Back pain stable on current pain medications. Plan to continue IV antibiotics until 3/8.  Will hold off on repeat imaging unless he continues to have fevers. Will remove PICC due to DVT and replace closer to discharge. - ID consulted, appreciate reccomendations - Continue daptomycin and ceftriaxone -Tramadol 50 mg three time daily  PRN, acetaminophen every 6 hours PRN.  Hypertension - Continue home Coreg - Hold lisinopril as he is normotensive today  Diet: Heart Healthy Carb modified IVF: None VTE: Eliquis Code: Full  Dispo: Anticipated discharge to Pending in  days pending further mangement.   Antonio Simmonds, MD 10/11/2020, 6:31 AM Pager: [phone]  Please contact the on call pager after 5 pm and on weekends at [phone].

## 2020-10-11 NOTE — Progress Notes (Signed)
PT Cancellation Note  Patient Details Name: Antonio Nash MRN: [id number] DOB: [birth year]   Cancelled Treatment:    Reason Eval/Treat Not Completed: Patient at procedure or test/unavailable Pt currently getting PICC line out. Will hold and follow up as schedule allows.   Cindee Salt, DPT  Acute Rehabilitation Services  Pager: [phone] Office: [phone]    [name] 10/11/2020, 2:39 PM

## 2020-10-11 NOTE — Progress Notes (Signed)
[facility] Infectious Diseases Follow Up Note  Patient Identification: Patient Name: Antonio Nash MRN: [id number] Admit Date: 10/08/2020 10:38 AM Age: 65 y.o.Today's Date: 10/11/2020   Reason for Visit: fevers, discitis and OM, PNA  Principal Problem:   Pulmonary emboli (HCC) Active Problems:   Osteomyelitis (HCC)   Type 2 diabetes mellitus without complication (HCC)   Discitis of thoracolumbar region   PICC (peripherally inserted central catheter) in place   DVT (deep venous thrombosis) (HCC)   Fever  Antibiotics:Daptomycin Ceftriaxone   Lines/Tubes:Rt arm PICC line, PIVs.   Interval Events: low grade fevers, no leukocytosis,Resp cx showing multiple organisms in GS, pending cultures. Blood cx NG in 2 days.    Assessment # Fevers # Weight loss/hemoptysis/Bilateral peripheral areas of consolidation # h/o smoking No infective cause found so far, Also has acute PE. Less likely to be drug related fever Blood cultures NG in 2 days  UA not consistent with infection  PICC line has DVT in the axillarty vein  LFTS are normal   # Osteomyelitis/discitis of the T5-6 region- Improving back pain with no new neurologic deficits  # Acute Pulmonary Emboli (right lobar, segmental, and subsegmental branches. Possible left subsegmental involvement. Borderline right heart strain.Borderline right heart strain. Bilateral peripheral areas of consolidation and atelectasis) Sars cov2 PCR* 2  negative  Influenza A and B negative resp PCR panel is negative  On Anticoagulation  Recommendations Agree with Doxycycline for 7 days as a trial although presentation is not c/w atypical PNA. He is already covered with broad spectrum antibiotics for PNA.   His fever curve is trending down. CBC with diff is unremarkable. Repeat SARScov2 PCR is negative. Noted that he has an acute DVT in the Rt axillay vein and this will  need to be exchanged  His fever on broad spectrum antibiotics is suspicious for non infective causes of fever as well including malignancy given h/o prior smoking  I will hold off on further imaging pending PICC line removal as this could be contributing to the fever.  Continue Daptomycin and ceftriaxone as is Monitor CBC, CMP and CPK ( CPK ordered)   Rest of the management as per the primary team. Thank you for the consult. Please page with pertinent questions or concerns.  ______________________________________________________________________ Subjective patient seen and examined at the bedside. He says back pain is better and stable. Wife at bedside who says that patient has been having chills and also has night sweats. He has lost approx 15 lbs since last 3 months. No new symptoms.   Vitals BP 120/83 (BP Location: Left Arm)   Pulse 97   Temp 99.6 F (37.6 C) (Oral)   Resp 18   Ht 5\' 9"  (1.753 m)   Wt 82.1 kg   SpO2 93%   BMI 26.73 kg/m     Physical Exam Average built male sitting up in bed On RA, no thrush  Chest - rales + CVS- Normal s1s2, RRR  Abdomen - soft, NT, BS+ Extremities - bilateral LE power 5/5, noperipheral joint swelling  Skin - no rashes  Back - no tenderness in the vertebra  Pertinent Microbiology Results for orders placed or performed during the hospital encounter of 10/08/20  Resp Panel by RT-PCR (Flu A&B, Covid) Nasopharyngeal Swab     Status: None   Collection Time: 10/08/20 12:39 PM   Specimen: Nasopharyngeal Swab; Nasopharyngeal(NP) swabs in vial transport medium  Result Value Ref Range Status   SARS Coronavirus 2 by RT PCR NEGATIVE NEGATIVE  Final    Comment: (NOTE) SARS-CoV-2 target nucleic acids are NOT DETECTED.  The SARS-CoV-2 RNA is generally detectable in upper respiratory specimens during the acute phase of infection. The lowest concentration of SARS-CoV-2 viral copies this assay can detect is 138 copies/mL. A negative result does not  preclude SARS-Cov-2 infection and should not be used as the sole basis for treatment or other patient management decisions. A negative result may occur with  improper specimen collection/handling, submission of specimen other than nasopharyngeal swab, presence of viral mutation(s) within the areas targeted by this assay, and inadequate number of viral copies(<138 copies/mL). A negative result must be combined with clinical observations, patient history, and epidemiological information. The expected result is Negative.  Fact Sheet for Patients:  BloggerCourse.comhttps://www.fda.gov/media/152166/download  Fact Sheet for Healthcare Providers:  SeriousBroker.ithttps://www.fda.gov/media/152162/download  This test is no t yet approved or cleared by the [place] FDA and  has been authorized for detection and/or diagnosis of SARS-CoV-2 by FDA under an Emergency Use Authorization (EUA). This EUA will remain  in effect (meaning this test can be used) for the duration of the COVID-19 declaration under Section 564(b)(1) of the Act, 21 U.S.C.section 360bbb-3(b)(1), unless the authorization is terminated  or revoked sooner.       Influenza A by PCR NEGATIVE NEGATIVE Final   Influenza B by PCR NEGATIVE NEGATIVE Final    Comment: (NOTE) The Xpert Xpress SARS-CoV-2/FLU/RSV plus assay is intended as an aid in the diagnosis of influenza from Nasopharyngeal swab specimens and should not be used as a sole basis for treatment. Nasal washings and aspirates are unacceptable for Xpert Xpress SARS-CoV-2/FLU/RSV testing.  Fact Sheet for Patients: BloggerCourse.comhttps://www.fda.gov/media/152166/download  Fact Sheet for Healthcare Providers: SeriousBroker.ithttps://www.fda.gov/media/152162/download  This test is not yet approved or cleared by the [place] FDA and has been authorized for detection and/or diagnosis of SARS-CoV-2 by FDA under an Emergency Use Authorization (EUA). This EUA will remain in effect (meaning this test can be used) for the  duration of the COVID-19 declaration under Section 564(b)(1) of the Act, 21 U.S.C. section 360bbb-3(b)(1), unless the authorization is terminated or revoked.  Performed at [facility], [address] [place], [place] [address]   Culture, blood (routine x 2)     Status: None (Preliminary result)   Collection Time: 10/09/20  6:20 PM   Specimen: BLOOD  Result Value Ref Range Status   Specimen Description BLOOD WRIST RIGHT  Final   Special Requests   Final    BOTTLES DRAWN AEROBIC AND ANAEROBIC Blood Culture adequate volume   Culture   Final    NO GROWTH 2 DAYS Performed at [facility], [address] [place], [place] [address]    Report Status PENDING  Incomplete  Culture, blood (routine x 2)     Status: None (Preliminary result)   Collection Time: 10/09/20  6:25 PM   Specimen: BLOOD  Result Value Ref Range Status   Specimen Description BLOOD LEFT WRIST  Final   Special Requests   Final    BOTTLES DRAWN AEROBIC AND ANAEROBIC Blood Culture adequate volume   Culture   Final    NO GROWTH 2 DAYS Performed at [facility], [address] [place], [place] [address]    Report Status PENDING  Incomplete  Expectorated Sputum Assessment w Gram Stain, Rflx to Resp Cult     Status: None   Collection Time: 10/09/20 10:08 PM   Specimen: Sputum  Result Value Ref Range Status  Specimen Description SPUTUM  Final   Special Requests NONE  Final   Sputum evaluation   Final    THIS SPECIMEN IS ACCEPTABLE FOR SPUTUM CULTURE Performed at [facility], [address], [place], [place] [address]    Report Status 10/09/2020 FINAL  Final  Culture, Respiratory w Gram Stain     Status: None (Preliminary result)   Collection Time: 10/09/20 10:08 PM   Specimen: SPU  Result Value Ref Range Status   Specimen Description SPUTUM  Final   Special Requests NONE Reflexed from [id number]  Final   Gram Stain   Final    ABUNDANT WBC PRESENT, PREDOMINANTLY PMN ABUNDANT GRAM POSITIVE  COCCI ABUNDANT GRAM NEGATIVE RODS MODERATE YEAST FEW GRAM POSITIVE RODS    Culture   Final    CULTURE REINCUBATED FOR BETTER GROWTH Performed at [facility], [address] [place], [place] [address]    Report Status PENDING  Incomplete  Respiratory (~20 pathogens) panel by PCR     Status: None   Collection Time: 10/10/20  6:34 AM   Specimen: Nasopharyngeal Swab; Respiratory  Result Value Ref Range Status   Adenovirus NOT DETECTED NOT DETECTED Final   Coronavirus 229E NOT DETECTED NOT DETECTED Final    Comment: (NOTE) The Coronavirus on the Respiratory Panel, DOES NOT test for the novel  Coronavirus (2019 nCoV)    Coronavirus HKU1 NOT DETECTED NOT DETECTED Final   Coronavirus NL63 NOT DETECTED NOT DETECTED Final   Coronavirus OC43 NOT DETECTED NOT DETECTED Final   Metapneumovirus NOT DETECTED NOT DETECTED Final   Rhinovirus / Enterovirus NOT DETECTED NOT DETECTED Final   Influenza A NOT DETECTED NOT DETECTED Final   Influenza B NOT DETECTED NOT DETECTED Final   Parainfluenza Virus 1 NOT DETECTED NOT DETECTED Final   Parainfluenza Virus 2 NOT DETECTED NOT DETECTED Final   Parainfluenza Virus 3 NOT DETECTED NOT DETECTED Final   Parainfluenza Virus 4 NOT DETECTED NOT DETECTED Final   Respiratory Syncytial Virus NOT DETECTED NOT DETECTED Final   Bordetella pertussis NOT DETECTED NOT DETECTED Final   Bordetella Parapertussis NOT DETECTED NOT DETECTED Final   Chlamydophila pneumoniae NOT DETECTED NOT DETECTED Final   Mycoplasma pneumoniae NOT DETECTED NOT DETECTED Final    Comment: Performed at [facility], [address], [place], [place] [address]  SARS CORONAVIRUS 2 (TAT 6-24 HRS) Nasopharyngeal Nasopharyngeal Swab     Status: None   Collection Time: 10/10/20  6:14 PM   Specimen: Nasopharyngeal Swab  Result Value Ref Range Status   SARS Coronavirus 2 NEGATIVE NEGATIVE Final    Comment: (NOTE) SARS-CoV-2 target nucleic acids are NOT DETECTED.  The SARS-CoV-2  RNA is generally detectable in upper and lower respiratory specimens during the acute phase of infection. Negative results do not preclude SARS-CoV-2 infection, do not rule out co-infections with other pathogens, and should not be used as the sole basis for treatment or other patient management decisions. Negative results must be combined with clinical observations, patient history, and epidemiological information. The expected result is Negative.  Fact Sheet for Patients: HairSlick.no  Fact Sheet for Healthcare Providers: quierodirigir.com  This test is not yet approved or cleared by the [place] FDA and  has been authorized for detection and/or diagnosis of SARS-CoV-2 by FDA under an Emergency Use Authorization (EUA). This EUA will remain  in effect (meaning this test can be used) for the duration of the COVID-19 declaration under Se ction 564(b)(1) of the Act,  21 U.S.C. section 360bbb-3(b)(1), unless the authorization is terminated or revoked sooner.  Performed at [facility], [address] [place], [place] [address]     Pertinent Lab CBC Latest Ref Rng & Units 10/11/2020 10/10/2020 10/09/2020  WBC 4.0 - 10.5 K/uL 9.5 9.5 11.0(H)  Hemoglobin 13.0 - 17.0 g/dL 4.4(I) 3.4(V) 4.2(V)  Hematocrit 39.0 - 52.0 % 30.0(L) 28.5(L) 30.0(L)  Platelets 150 - 400 K/uL 469(H) 370 383   CMP Latest Ref Rng & Units 10/11/2020 10/10/2020 10/09/2020  Glucose 70 - 99 mg/dL 956(L) 875(I) 87  BUN 8 - 23 mg/dL 9 12 20   Creatinine 0.61 - 1.24 mg/dL 4.33 2.95  Sodium 135 - 145 mmol/L 133(L) 135 130(L)  Potassium 3.5 - 5.1 mmol/L 3.8 3.5 4.1  Chloride 98 - 111 mmol/L 100 103 101  CO2 22 - 32 mmol/L 21(L) 19(L) 14(L)  Calcium 8.9 - 10.3 mg/dL 8.3(L) 8.2(L) 8.0(L)  Total Protein 6.5 - 8.1 g/dL - - -  Total Bilirubin 0.3 - 1.2 mg/dL - - -  Alkaline Phos 38 - 126 U/L - - -  AST 15 - 41 U/L - - -  ALT 0 - 44 U/L - - -    Pertinent  Imaging today Plain films and CT images have been personally visualized and interpreted; radiology reports have been reviewed. Decision making incorporated into the Impression / Recommendations.  I have spent approx 30 minutes for this patient encounter including review of prior medical records with greater than 50% of time being face to face and coordination of their care.  Electronically signed by:   1.88, MD Infectious Disease Physician [facility] Pager: [phone]

## 2020-10-12 DIAGNOSIS — R509 Fever, unspecified: Secondary | ICD-10-CM | POA: Diagnosis not present

## 2020-10-12 DIAGNOSIS — E119 Type 2 diabetes mellitus without complications: Secondary | ICD-10-CM

## 2020-10-12 DIAGNOSIS — I2699 Other pulmonary embolism without acute cor pulmonale: Secondary | ICD-10-CM | POA: Diagnosis not present

## 2020-10-12 DIAGNOSIS — J181 Lobar pneumonia, unspecified organism: Secondary | ICD-10-CM | POA: Diagnosis not present

## 2020-10-12 DIAGNOSIS — M4645 Discitis, unspecified, thoracolumbar region: Secondary | ICD-10-CM | POA: Diagnosis not present

## 2020-10-12 LAB — GLUCOSE, CAPILLARY
Glucose-Capillary: 138 mg/dL — ABNORMAL HIGH (ref 70–99)
Glucose-Capillary: 165 mg/dL — ABNORMAL HIGH (ref 70–99)
Glucose-Capillary: 129 mg/dL — ABNORMAL HIGH (ref 70–99)
Glucose-Capillary: 109 mg/dL — ABNORMAL HIGH (ref 70–99)

## 2020-10-12 LAB — CBC
HCT: 29.3 % — ABNORMAL LOW (ref 39.0–52.0)
Hemoglobin: 9.3 g/dL — ABNORMAL LOW (ref 13.0–17.0)
MCH: 25.8 pg — ABNORMAL LOW (ref 26.0–34.0)
MCHC: 31.7 g/dL (ref 30.0–36.0)
MCV: 81.4 fL (ref 80.0–100.0)
Platelets: 528 10*3/uL — ABNORMAL HIGH (ref 150–400)
RBC: 3.6 MIL/uL — ABNORMAL LOW (ref 4.22–5.81)
RDW: 14.2 % (ref 11.5–15.5)
WBC: 8.6 10*3/uL (ref 4.0–10.5)
nRBC: 0 % (ref 0.0–0.2)

## 2020-10-12 LAB — BASIC METABOLIC PANEL
Anion gap: 10 (ref 5–15)
BUN: 10 mg/dL (ref 8–23)
CO2: 22 mmol/L (ref 22–32)
Calcium: 8.5 mg/dL — ABNORMAL LOW (ref 8.9–10.3)
Chloride: 100 mmol/L (ref 98–111)
Creatinine, Ser: 0.78 mg/dL (ref 0.61–1.24)
GFR, Estimated: >60 mL/min (ref >60)
Glucose, Bld: 143 mg/dL — ABNORMAL HIGH (ref 70–99)
Potassium: 4.4 mmol/L (ref 3.5–5.1)
Sodium: 132 mmol/L — ABNORMAL LOW (ref 135–145)

## 2020-10-12 LAB — CULTURE, RESPIRATORY W GRAM STAIN: Culture: NORMAL

## 2020-10-12 MED ORDER — VANCOMYCIN HCL 1000 MG/200ML IV SOLN
1000.0000 mg | Freq: Two times a day (BID) | INTRAVENOUS | Status: DC
  Administered 2020-10-13 – 2020-10-15 (×5): 1000 mg via INTRAVENOUS
  Filled 2020-10-12 (×6): qty 200

## 2020-10-12 MED ORDER — INSULIN ASPART 100 UNIT/ML ~~LOC~~ SOLN
0.0000 [IU] | SUBCUTANEOUS | Status: DC
  Administered 2020-10-12: 2 [IU] via SUBCUTANEOUS
  Administered 2020-10-12: 1 [IU] via SUBCUTANEOUS
  Administered 2020-10-13 (×2): 2 [IU] via SUBCUTANEOUS
  Administered 2020-10-13: 3 [IU] via SUBCUTANEOUS
  Administered 2020-10-14 (×2): 2 [IU] via SUBCUTANEOUS
  Administered 2020-10-14: 1 [IU] via SUBCUTANEOUS
  Administered 2020-10-14: 2 [IU] via SUBCUTANEOUS
  Administered 2020-10-14 – 2020-10-15 (×2): 1 [IU] via SUBCUTANEOUS

## 2020-10-12 MED ORDER — VANCOMYCIN HCL 1500 MG/300ML IV SOLN
1500.0000 mg | Freq: Once | INTRAVENOUS | Status: AC
  Administered 2020-10-12: 1500 mg via INTRAVENOUS
  Filled 2020-10-12: qty 300

## 2020-10-12 NOTE — Plan of Care (Signed)

## 2020-10-12 NOTE — Progress Notes (Signed)
   10/12/20 0429  Assess: MEWS Score  Temp (!) 102.4 F (39.1 C) (Dr Claudette Laws notified at 0430. Tylenol given)  BP 126/74  Pulse Rate 94  Resp 17  SpO2 97 %  O2 Device Room Air  Assess: MEWS Score  MEWS Temp 2  MEWS Systolic 0  MEWS Pulse 0  MEWS RR 0  MEWS LOC 0  MEWS Score 2  MEWS Score Color Yellow  Assess: if the MEWS score is Yellow or Red  Were vital signs taken at a resting state? Yes  Focused Assessment Change from prior assessment (see assessment flowsheet)  Early Detection of Sepsis Score *See Row Information* Medium  MEWS guidelines implemented *See Row Information* Yes  Treat  MEWS Interventions Administered prn meds/treatments  Pain Scale 0-10  Pain Score 2  Pain Type Acute pain  Pain Location Back  Pain Orientation Lower  Pain Descriptors / Indicators Dull  Pain Frequency Intermittent  Pain Onset On-going  Patients Stated Pain Goal 2  Pain Intervention(s) Medication (See eMAR)  Multiple Pain Sites No  Take Vital Signs  Increase Vital Sign Frequency  Yellow: Q 2hr X 2 then Q 4hr X 2, if remains yellow, continue Q 4hrs  Escalate  MEWS: Escalate Yellow: discuss with charge nurse/RN and consider discussing with provider and RRT Julieanne Cotton)  Notify: Charge Nurse/RN  Name of Charge Nurse/RN Notified Gerton, C.  Date Charge Nurse/RN Notified 10/12/20  Time Charge Nurse/RN Notified [id number]  Notify: Provider  Provider Name/Title Dr Claudette Laws  Date Provider Notified 10/12/20  Time Provider Notified [phone]  Notification Type Page  Notification Reason Other (Comment) (Temp of 102.4)  Provider response Other (Comment) (waiting for order)

## 2020-10-12 NOTE — Evaluation (Signed)
Physical Therapy Evaluation Patient Details Name: Antonio Nash MRN: [id number] DOB: [birth year] Today's Date: 10/12/2020   History of Present Illness  Patient is a 65 y/o male with presents with hemoptysis, fever and SOB. Found to have acute DVT RUE, acute PE with heart strain and multifocal pulmonary opacities. Recent admission 1/14-1/17 fot osteomyelitis/discitis T5/6. PMH significant for DM, HTN, and diverticulitis.  Clinical Impression  Patient presents with mild dyspnea on exertion, generalized weakness, impaired balance and impaired mobility s/p above. Pt reports being Mod I with rollator (borrowed) and ADLs PTA. Reports 1 fall since d/c home in January on stairs. Today, pt tolerated Min guard-Min A for transfers and gait training with use of RW for support. 1 instance of LOB during gait needing Min A to correct. Sp02 stayed >88% on RA during session. Encouraged increasing activity and mobilizing with nursing a few times daily to improve strength/mobility. Will follow acutely to maximize independence and mobility prior to return home.    Follow Up Recommendations No PT follow up;Supervision - Intermittent    Equipment Recommendations  Rolling walker with 5" wheels    Recommendations for Other Services       Precautions / Restrictions Precautions Precautions: Fall;Other (comment) Precaution Comments: watch 02 Restrictions Weight Bearing Restrictions: No      Mobility  Bed Mobility Overal bed mobility: Needs Assistance Bed Mobility: Supine to Sit;Sit to Supine     Supine to sit: Modified independent (Device/Increase time);HOB elevated Sit to supine: Modified independent (Device/Increase time);HOB elevated   General bed mobility comments: No assist needed. Use of rail.    Transfers Overall transfer level: Needs assistance Equipment used: Rolling walker (2 wheeled) Transfers: Sit to/from Stand Sit to Stand: Min guard         General transfer comment: Min guard for  safety. Stood from EOB x1, no dizziness.  Ambulation/Gait Ambulation/Gait assistance: Min guard;Min assist Gait Distance (Feet): 150 Feet Assistive device: Rolling walker (2 wheeled) Gait Pattern/deviations: Step-through pattern;Decreased stride length;Narrow base of support;Scissoring Gait velocity: decreased Gait velocity interpretation: 1.31 - 2.62 ft/sec, indicative of limited community ambulator General Gait Details: Slow, mildly unsteady gait with narrow BoS and scissoring noted x2; 1 LOB posteriorly needing min A to correct. 2/4 DOE. Sp02 stayed >88% on RA, recovered quickly. Cues to not pick up RW but roll it  Stairs            Wheelchair Mobility    Modified Rankin (Stroke Patients Only)       Balance Overall balance assessment: Needs assistance Sitting-balance support: Feet supported;No upper extremity supported Sitting balance-Leahy Scale: Good Sitting balance - Comments: Able to reach outside BoS and donn slippers without LOB or difficulty.   Standing balance support: During functional activity Standing balance-Leahy Scale: Poor Standing balance comment: Requires UE support in standing.                             Pertinent Vitals/Pain Pain Assessment: No/denies pain    Home Living Family/patient expects to be discharged to:: Private residence Living Arrangements: Spouse/significant other Available Help at Discharge: Family;Available PRN/intermittently Type of Home: House Home Access: Stairs to enter Entrance Stairs-Rails: None Entrance Stairs-Number of Steps: 3 Home Layout: One level Home Equipment: Other (comment) Additional Comments: borrowing rollator    Prior Function Level of Independence: Independent with assistive device(s)         Comments: Using rollator for ambulation. Doing own ADLs. Reports 1 fall on stairs.  Uses crutch for outside home, hx of left hip issues needs hip replacement.     Hand Dominance         Extremity/Trunk Assessment   Upper Extremity Assessment Upper Extremity Assessment: Defer to OT evaluation    Lower Extremity Assessment Lower Extremity Assessment: Generalized weakness;LLE deficits/detail (but functional) LLE Deficits / Details: Hip/gluteal weakness- needing hip replacement.       Communication   Communication: No difficulties  Cognition Arousal/Alertness: Awake/alert Behavior During Therapy: WFL for tasks assessed/performed;Flat affect Overall Cognitive Status: Within Functional Limits for tasks assessed                                 General Comments: appears [facility] for basic mobility tasks. Frustrated that he cannot get up on his own.      General Comments General comments (skin integrity, edema, etc.): Sp02 stayed >88% on RA during activity, recovered quickly to >90% withint seconds. HR in 90s bpm.    Exercises     Assessment/Plan    PT Assessment Patient needs continued PT services  PT Problem List Decreased strength;Decreased mobility;Decreased balance;Cardiopulmonary status limiting activity;Decreased knowledge of use of DME       PT Treatment Interventions Therapeutic exercise;DME instruction;Gait training;Stair training;Functional mobility training;Therapeutic activities;Patient/family education;Balance training    PT Goals (Current goals can be found in the Care Plan section)  Acute Rehab PT Goals Patient Stated Goal: to get up and move PT Goal Formulation: With patient Time For Goal Achievement: 10/26/20 Potential to Achieve Goals: Good    Frequency Min 3X/week   Barriers to discharge Inaccessible home environment stairs    Co-evaluation               AM-PAC PT "6 Clicks" Mobility  Outcome Measure Help needed turning from your back to your side while in a flat bed without using bedrails?: None Help needed moving from lying on your back to sitting on the side of a flat bed without using bedrails?: None Help needed  moving to and from a bed to a chair (including a wheelchair)?: A Little Help needed standing up from a chair using your arms (e.g., wheelchair or bedside chair)?: A Little Help needed to walk in hospital room?: A Little Help needed climbing 3-5 steps with a railing? : A Little 6 Click Score: 20    End of Session Equipment Utilized During Treatment: Gait belt Activity Tolerance: Patient tolerated treatment well Patient left: in bed;with call bell/phone within reach;with bed alarm set Nurse Communication: Mobility status PT Visit Diagnosis: Unsteadiness on feet (R26.81);Difficulty in walking, not elsewhere classified (R26.2);Muscle weakness (generalized) (M62.81)    Time: 0932-6712 PT Time Calculation (min) (ACUTE ONLY): 22 min   Charges:   PT Evaluation $PT Eval Moderate Complexity: 1 Mod          Antonio Nash, PT, DPT Acute Rehabilitation Services Pager [phone] Office [phone]      Blake Divine A Lanier Ensign 10/12/2020, 2:31 PM

## 2020-10-12 NOTE — Progress Notes (Addendum)
[facility] Infectious Diseases Follow Up Note  Patient Identification: Patient Name: Antonio Nash MRN: [id number] Admit Date: 10/08/2020 10:38 AM Age: 65 y.o.Today's Date: 10/12/2020   Reason for Visit: Fevers, discitis and OM   Principal Problem:   Pulmonary emboli (HCC) Active Problems:   Osteomyelitis (HCC)   Type 2 diabetes mellitus without complication (HCC)   Discitis of thoracolumbar region   PICC (peripherally inserted central catheter) in place   DVT (deep venous thrombosis) (HCC)   Fever  Antibiotics:Daptomycin Ceftriaxone   Lines/Tubes: PIVs.   Interval Events: Rt arm PICC line was removed. Fever T max 102.4./ No leukoytosism, no new symptoms and stable otherwise   Assessment #Fevers # Weight loss/hemoptysis/Bilateral peripheral areas of consolidation # h/o smoking No infective cause found so far, Also has acute PE. Less likely to be drug related fever Blood cultures NG in 2 days  UA not consistent with infection  LFTS are normal   #Osteomyelitis/discitis of the T5-6 region- Improving back pain with no new neurologic deficits  #Acute Pulmonary Emboli (right lobar, segmental, and subsegmental branches. Possible left subsegmental involvement. Borderline right heart strain.Borderline right heart strain. Bilateral peripheral areas of consolidation and atelectasis) Sars cov2 PCR* 2  negative Influenza A and B negative resp PCR panel is negative Resp culture with normal resp flora    # DVT in rt axillary vein in the site of PICC line - removed on 2/24 On Anticoagulation  Recommendations I have high suspicion for the fevers being related to his lung ( pulmonary infarct vs PNA) vs his spine ( clinically better) Would re-discuss with Pulm for getting a bronch/BAL if he continues to spike fevers  Would also have low threshold to image his back if he continues to spike fever in  next one or 2 days  Would recommend to do autoimmune work up - ANA, RF, ANCA. Defer to primary  Will change Daptomycin to Vancomycin,pharmacy to dose for the time being  Continue ceftriaxone Continue Doxycycline for 7 days   Monitor fever curve and blood cultures   Dr Earlene Plater is available over the weekend. Otherwise new ID team will follow on Monday   Rest of the management as per the primary team. Thank you for the consult. Please page with pertinent questions or concerns.  ______________________________________________________________________ Subjective patient seen and examined at the bedside. Patient's wife on phone when seen this morning. Cough is better, no more blood in stool. Back pain is better. He is able to walk to the bathroom. Denies any rashes and joint pain   Vitals BP 121/77 (BP Location: Left Arm)   Pulse 86   Temp 98.8 F (37.1 C) (Oral)   Resp 16   Ht 5\' 9"  (1.753 m)   Wt 82.1 kg   SpO2 97%   BMI 26.73 kg/m     Physical Exam Average built male sitting up in bed On RA, no thrush  Chest - rales + CVS- Normal s1s2, RRR  Abdomen - soft, NT, BS+ Extremities - bilateral LE power 5/5, noperipheral joint swelling  Skin - no rashes  Back - no tenderness in the vertebra Neuro- non focal, aa0-3   Pertinent Microbiology Results for orders placed or performed during the hospital encounter of 10/08/20  Resp Panel by RT-PCR (Flu A&B, Covid) Nasopharyngeal Swab     Status: None   Collection Time: 10/08/20 12:39 PM   Specimen: Nasopharyngeal Swab; Nasopharyngeal(NP) swabs in vial transport medium  Result Value Ref Range Status   SARS Coronavirus  2 by RT PCR NEGATIVE NEGATIVE Final    Comment: (NOTE) SARS-CoV-2 target nucleic acids are NOT DETECTED.  The SARS-CoV-2 RNA is generally detectable in upper respiratory specimens during the acute phase of infection. The lowest concentration of SARS-CoV-2 viral copies this assay can detect is 138 copies/mL. A negative  result does not preclude SARS-Cov-2 infection and should not be used as the sole basis for treatment or other patient management decisions. A negative result may occur with  improper specimen collection/handling, submission of specimen other than nasopharyngeal swab, presence of viral mutation(s) within the areas targeted by this assay, and inadequate number of viral copies(<138 copies/mL). A negative result must be combined with clinical observations, patient history, and epidemiological information. The expected result is Negative.  Fact Sheet for Patients:  BloggerCourse.com  Fact Sheet for Healthcare Providers:  SeriousBroker.it  This test is no t yet approved or cleared by the [place] FDA and  has been authorized for detection and/or diagnosis of SARS-CoV-2 by FDA under an Emergency Use Authorization (EUA). This EUA will remain  in effect (meaning this test can be used) for the duration of the COVID-19 declaration under Section 564(b)(1) of the Act, 21 U.S.C.section 360bbb-3(b)(1), unless the authorization is terminated  or revoked sooner.       Influenza A by PCR NEGATIVE NEGATIVE Final   Influenza B by PCR NEGATIVE NEGATIVE Final    Comment: (NOTE) The Xpert Xpress SARS-CoV-2/FLU/RSV plus assay is intended as an aid in the diagnosis of influenza from Nasopharyngeal swab specimens and should not be used as a sole basis for treatment. Nasal washings and aspirates are unacceptable for Xpert Xpress SARS-CoV-2/FLU/RSV testing.  Fact Sheet for Patients: BloggerCourse.com  Fact Sheet for Healthcare Providers: SeriousBroker.it  This test is not yet approved or cleared by the [place] FDA and has been authorized for detection and/or diagnosis of SARS-CoV-2 by FDA under an Emergency Use Authorization (EUA). This EUA will remain in effect (meaning this test can be used)  for the duration of the COVID-19 declaration under Section 564(b)(1) of the Act, 21 U.S.C. section 360bbb-3(b)(1), unless the authorization is terminated or revoked.  Performed at [facility], [address] [place], [place] [address]   Culture, blood (routine x 2)     Status: None (Preliminary result)   Collection Time: 10/09/20  6:20 PM   Specimen: BLOOD  Result Value Ref Range Status   Specimen Description BLOOD WRIST RIGHT  Final   Special Requests   Final    BOTTLES DRAWN AEROBIC AND ANAEROBIC Blood Culture adequate volume   Culture   Final    NO GROWTH 2 DAYS Performed at [facility], [address], [place], [place] [address]    Report Status PENDING  Incomplete  Culture, blood (routine x 2)     Status: None (Preliminary result)   Collection Time: 10/09/20  6:25 PM   Specimen: BLOOD  Result Value Ref Range Status   Specimen Description BLOOD LEFT WRIST  Final   Special Requests   Final    BOTTLES DRAWN AEROBIC AND ANAEROBIC Blood Culture adequate volume   Culture   Final    NO GROWTH 2 DAYS Performed at [facility], [address] [place], [place] [address]    Report Status PENDING  Incomplete  Expectorated Sputum Assessment w Gram Stain, Rflx to Resp Cult     Status: None   Collection Time: 10/09/20 10:08 PM   Specimen: Sputum  Result  Value Ref Range Status   Specimen Description SPUTUM  Final   Special Requests NONE  Final   Sputum evaluation   Final    THIS SPECIMEN IS ACCEPTABLE FOR SPUTUM CULTURE Performed at [facility], [address] [place], [place] [address]    Report Status 10/09/2020 FINAL  Final  Culture, Respiratory w Gram Stain     Status: None   Collection Time: 10/09/20 10:08 PM   Specimen: SPU  Result Value Ref Range Status   Specimen Description SPUTUM  Final   Special Requests NONE Reflexed from [id number]  Final   Gram Stain   Final    ABUNDANT WBC PRESENT, PREDOMINANTLY PMN ABUNDANT GRAM POSITIVE  COCCI ABUNDANT GRAM NEGATIVE RODS MODERATE YEAST FEW GRAM POSITIVE RODS    Culture   Final    Normal respiratory flora-no Staph aureus or Pseudomonas seen Performed at [facility], [address] [place], [place] [address]    Report Status 10/12/2020 FINAL  Final  Respiratory (~20 pathogens) panel by PCR     Status: None   Collection Time: 10/10/20  6:34 AM   Specimen: Nasopharyngeal Swab; Respiratory  Result Value Ref Range Status   Adenovirus NOT DETECTED NOT DETECTED Final   Coronavirus 229E NOT DETECTED NOT DETECTED Final    Comment: (NOTE) The Coronavirus on the Respiratory Panel, DOES NOT test for the novel  Coronavirus (2019 nCoV)    Coronavirus HKU1 NOT DETECTED NOT DETECTED Final   Coronavirus NL63 NOT DETECTED NOT DETECTED Final   Coronavirus OC43 NOT DETECTED NOT DETECTED Final   Metapneumovirus NOT DETECTED NOT DETECTED Final   Rhinovirus / Enterovirus NOT DETECTED NOT DETECTED Final   Influenza A NOT DETECTED NOT DETECTED Final   Influenza B NOT DETECTED NOT DETECTED Final   Parainfluenza Virus 1 NOT DETECTED NOT DETECTED Final   Parainfluenza Virus 2 NOT DETECTED NOT DETECTED Final   Parainfluenza Virus 3 NOT DETECTED NOT DETECTED Final   Parainfluenza Virus 4 NOT DETECTED NOT DETECTED Final   Respiratory Syncytial Virus NOT DETECTED NOT DETECTED Final   Bordetella pertussis NOT DETECTED NOT DETECTED Final   Bordetella Parapertussis NOT DETECTED NOT DETECTED Final   Chlamydophila pneumoniae NOT DETECTED NOT DETECTED Final   Mycoplasma pneumoniae NOT DETECTED NOT DETECTED Final    Comment: Performed at [facility], [address] [place], [place] [address]  SARS CORONAVIRUS 2 (TAT 6-24 HRS) Nasopharyngeal Nasopharyngeal Swab     Status: None   Collection Time: 10/10/20  6:14 PM   Specimen: Nasopharyngeal Swab  Result Value Ref Range Status   SARS Coronavirus 2 NEGATIVE NEGATIVE Final    Comment: (NOTE) SARS-CoV-2 target nucleic acids are NOT  DETECTED.  The SARS-CoV-2 RNA is generally detectable in upper and lower respiratory specimens during the acute phase of infection. Negative results do not preclude SARS-CoV-2 infection, do not rule out co-infections with other pathogens, and should not be used as the sole basis for treatment or other patient management decisions. Negative results must be combined with clinical observations, patient history, and epidemiological information. The expected result is Negative.  Fact Sheet for Patients: HairSlick.nohttps://www.fda.gov/media/138098/download  Fact Sheet for Healthcare Providers: quierodirigir.comhttps://www.fda.gov/media/138095/download  This test is not yet approved or cleared by the [place] FDA and  has been authorized for detection and/or diagnosis of SARS-CoV-2 by FDA under an Emergency Use Authorization (EUA). This EUA will remain  in effect (meaning this test can be used) for the duration of the COVID-19  declaration under Se ction 564(b)(1) of the Act, 21 U.S.C. section 360bbb-3(b)(1), unless the authorization is terminated or revoked sooner.  Performed at [facility], [address] [place], [place] [address]      Pertinent Lab. CBC Latest Ref Rng & Units 10/12/2020 10/11/2020 10/10/2020  WBC 4.0 - 10.5 K/uL 8.6 9.5 9.5  Hemoglobin 13.0 - 17.0 g/dL 2.1(H) 0.8(M) 5.7(Q)  Hematocrit 39.0 - 52.0 % 29.3(L) 30.0(L) 28.5(L)  Platelets 150 - 400 K/uL 528(H) 469(H) 370   CMP Latest Ref Rng & Units 10/12/2020 10/11/2020 10/10/2020  Glucose 70 - 99 mg/dL 469(G) 295(M) 841(L)  BUN 8 - 23 mg/dL 10 9 12   Creatinine 0.61 - 1.24 mg/dL 2.44 0.10  Sodium 135 - 145 mmol/L 132(L) 133(L) 135  Potassium 3.5 - 5.1 mmol/L 4.4 3.8 3.5  Chloride 98 - 111 mmol/L 100 100 103  CO2 22 - 32 mmol/L 22 21(L) 19(L)  Calcium 8.9 - 10.3 mg/dL 2.72) 8.3(L) 8.2(L)  Total Protein 6.5 - 8.1 g/dL - - -  Total Bilirubin 0.3 - 1.2 mg/dL - - -  Alkaline Phos 38 - 126 U/L - - -  AST 15 - 41 U/L - - -  ALT 0  - 44 U/L - - -    Pertinent Imaging today Plain films and CT images have been personally visualized and interpreted; radiology reports have been reviewed. Decision making incorporated into the Impression / Recommendations.  I have spent approx 30 minutes for this patient encounter including review of prior medical records with greater than 50% of time being face to face and coordination of their care.  Electronically signed by:   5.3(G, MD Infectious Disease Physician [facility] Infect[facility] Pager: [phone]

## 2020-10-12 NOTE — Progress Notes (Signed)
Pharmacy Antibiotic Note  Antonio Nash is a 65 y.o. male admitted on 10/08/2020 with osteomyelitis/discitis of the T5-6 region. He was originally scheduled to stop his antibiotics today but patient continues to be febrile.  Pharmacy has been consulted for vancomycin to have better pneumonia coverage in comparison to daptomycin. SCr- 0.78 with CrCl ~ 90 ml/min.   Plan: Vancomycin 1500 mg x 1 then 1000 mg q 12 hours  Predicted AUC 454 with SCr rounded to 1 Continue ceftriaxone 2 gm q 24 hours  Monitor renal fxn, clinical s/sx of infection  Height: 5\' 9"  (175.3 cm) Weight: 82.1 kg (181 lb) IBW/kg (Calculated) : 70.7  Temp (24hrs), Avg:99.5 F (37.5 C), Min:98.2 F (36.8 C), Max:102.4 F (39.1 C)  Recent Labs  Lab 10/08/20 1059 10/08/20 1257 10/09/20 0041 10/09/20 0122 10/10/20 0326 10/11/20 0357 10/12/20 0723  WBC 12.5*  --   --  11.0* 9.5 9.5 8.6  CREATININE 1.37*  --  1.08  --  0.83 0.75 0.78  LATICACIDVEN 2.5* 0.7  --   --   --   --   --     Estimated Creatinine Clearance: 90.8 mL/min (by C-G formula based on SCr of 0.78 mg/dL).    Allergies  Allergen Reactions  . Codeine Itching    Other reaction(s): Other (See Comments) Skin crawls  . Metformin Diarrhea      Thank you for allowing pharmacy to be a part of this patient's care.  10/14/20, PharmD, BCPS, BCIDP Infectious Diseases Clinical Pharmacist Phone: [phone] 10/12/2020 9:40 AM

## 2020-10-12 NOTE — Progress Notes (Signed)
HD#4 Subjective:  Overnight Events:   Patient is seen at bedside. Patient states that he would like to go. States his breathing is getting better. Reports similar bloody sputum like yesterday.   Would like to walk to bathroom alone. Pending PT evaluation. Denies new weakness or numbness of bilateral LE.   Patient's wife states that patient has issue with left hip, plan was to proceed with hip replacement. Wife inquired about a steroid injection. Discussed this would not be a good idea given current infection and fevers. Patient states that lidocaine patch did not help.   Objective:  Vital signs in last 24 hours: Vitals:   10/11/20 2052 10/12/20 0429 10/12/20 0609 10/12/20 0758  BP: 130/76 126/74 127/77 121/77  Pulse: 88 94 94 86  Resp: 18 17 18 16   Temp: 99.6 F (37.6 C) (!) 102.4 F (39.1 C) 98.6 F (37 C) 98.8 F (37.1 C)  TempSrc: Oral Oral Oral Oral  SpO2: 94% 97% 95% 97%  Weight:      Height:       Supplemental O2: Nasal Cannula SpO2: 97 % O2 Flow Rate (L/min): 2 L/min   Physical Exam:  Physical Exam Constitutional:      Appearance: Normal appearance.  HENT:     Head: Normocephalic and atraumatic.     Right Ear: External ear normal.     Left Ear: External ear normal.     Mouth/Throat:     Mouth: Mucous membranes are moist.     Pharynx: Oropharynx is clear.  Eyes:     Extraocular Movements: Extraocular movements intact.     Conjunctiva/sclera: Conjunctivae normal.     Pupils: Pupils are equal, round, and reactive to light.  Cardiovascular:     Rate and Rhythm: Normal rate and regular rhythm.     Pulses: Normal pulses.     Heart sounds: Normal heart sounds.  Pulmonary:     Effort: Pulmonary effort is normal.     Breath sounds: Rhonchi: bilateral.  Abdominal:     General: Abdomen is flat. Bowel sounds are normal.     Palpations: Abdomen is soft.  Musculoskeletal:     Right lower leg: No edema.     Left lower leg: No edema.     Comments: Left hip  pain   Skin:    General: Skin is warm and dry.     Capillary Refill: Capillary refill takes less than 2 seconds.  Neurological:     General: No focal deficit present.     Mental Status: He is alert and oriented to person, place, and time. Mental status is at baseline.    Filed Weights   10/08/20 1121  Weight: 82.1 kg    Intake/Output Summary (Last 24 hours) at 10/12/2020 0932 Last data filed at 10/12/2020 0720 Gross per 24 hour  Intake 1155 ml  Output 1625 ml  Net -470 ml   Net IO Since Admission: -289.8 mL [10/12/20 0932]  Pertinent Labs: CBC Latest Ref Rng & Units 10/12/2020 10/11/2020 10/10/2020  WBC 4.0 - 10.5 K/uL 8.6 9.5 9.5  Hemoglobin 13.0 - 17.0 g/dL 10/12/2020) 0.0(T) 6.2(U)  Hematocrit 39.0 - 52.0 % 29.3(L) 30.0(L) 28.5(L)  Platelets 150 - 400 K/uL 528(H) 469(H) 370    CMP Latest Ref Rng & Units 10/12/2020 10/11/2020 10/10/2020  Glucose 70 - 99 mg/dL 10/12/2020) 354(T) 625(W)  BUN 8 - 23 mg/dL 10 9 12   Creatinine 0.61 - 1.24 mg/dL 389(H 7.34  Sodium 135 - 145 mmol/L  132(L) 133(L) 135  Potassium 3.5 - 5.1 mmol/L 4.4 3.8 3.5  Chloride 98 - 111 mmol/L 100 100 103  CO2 22 - 32 mmol/L 22 21(L) 19(L)  Calcium 8.9 - 10.3 mg/dL 1.6(X) 8.3(L) 8.2(L)  Total Protein 6.5 - 8.1 g/dL - - -  Total Bilirubin 0.3 - 1.2 mg/dL - - -  Alkaline Phos 38 - 126 U/L - - -  AST 15 - 41 U/L - - -  ALT 0 - 44 U/L - - -   Imaging: No results found.  Assessment/Plan:   Principal Problem:   Pulmonary emboli (HCC) Active Problems:   Osteomyelitis (HCC)   Type 2 diabetes mellitus without complication (HCC)   Discitis of thoracolumbar region   PICC (peripherally inserted central catheter) in place   DVT (deep venous thrombosis) (HCC)   Fever  Patient Summary: Mr. Bosher is a 65 y/o male with a past medical history of hypertension, diabetes, T5-6 discitis/osteomyletis currently on IV daptomycin and ceftriaxone presents for worsening back pain and shortness of breath for the past 2 days found  to have right pulmonary emboli.  Intermitted Fevers Continues to be intermittently febrile . Tmax of 102 F overnight with night sweats. No clear source for this fever. On day 2 of doxycycline to cover for atypical pneumonia given CT findings. Picc removed yesterday. Will  continue on IV antibiotics for back osteomyelitis. TB results pending. Will transition from daptomycin to vanc for possible eosinophilic pneumonia, Will repeat MRI if fevers not improving tomorrow.  - ID consulted appreciate recommendations - Start vanc - Continue doxy and rocephin - Repeat cultures drawn, prior cultures without growth todate - Trend fever curve fever  Provoked PE in setting on acute infection and RUE DVT 2/2 to PICC line No worsening of bloody sputum still have a small amount with cough and pluritic chest pain. Remains comfortable on room air. PICC removed on 10/11/2020. Doing well currently.  - Continue apixiban for 3 months  - O2 as need to maintain saturation >90%, - Monitor on tele - PT recs pending  T5-T6 Discitis/Osteomyelitis  Back pain stable on current pain medications. Plan to continue IV antibiotics until 3/8.  Remove PICC due to DVT will need to be replace prior to discharge. Switching to vanc due to concern for eosinophilic pneumonia.  - ID consulted, appreciate reccomendations - start vanc, continue ceftriaxone, stop dapto -Tramadol 50 mg three time daily  PRN, acetaminophen every 6 hours PRN.  Hypertension - Continue home Coreg and amlidipine - Hold lisinopril as he is normotensive  Diet: Heart Healthy Carb modified IVF: None VTE: Eliquis Code: Full  Dispo: Anticipated discharge to Pending in  days pending further mangement.   Quincy Simmonds, MD 10/12/2020, 9:32 AM Pager: [phone]  Please contact the on call pager after 5 pm and on weekends at [phone].

## 2020-10-13 ENCOUNTER — Inpatient Hospital Stay (HOSPITAL_COMMUNITY): Payer: No Typology Code available for payment source

## 2020-10-13 ENCOUNTER — Inpatient Hospital Stay: Payer: Self-pay

## 2020-10-13 LAB — GLUCOSE, CAPILLARY
Glucose-Capillary: 102 mg/dL — ABNORMAL HIGH (ref 70–99)
Glucose-Capillary: 113 mg/dL — ABNORMAL HIGH (ref 70–99)
Glucose-Capillary: 118 mg/dL — ABNORMAL HIGH (ref 70–99)
Glucose-Capillary: 151 mg/dL — ABNORMAL HIGH (ref 70–99)
Glucose-Capillary: 162 mg/dL — ABNORMAL HIGH (ref 70–99)
Glucose-Capillary: 163 mg/dL — ABNORMAL HIGH (ref 70–99)
Glucose-Capillary: 182 mg/dL — ABNORMAL HIGH (ref 70–99)

## 2020-10-13 LAB — CBC
HCT: 30 % — ABNORMAL LOW (ref 39.0–52.0)
Hemoglobin: 9.3 g/dL — ABNORMAL LOW (ref 13.0–17.0)
MCH: 25.2 pg — ABNORMAL LOW (ref 26.0–34.0)
MCHC: 31 g/dL (ref 30.0–36.0)
MCV: 81.3 fL (ref 80.0–100.0)
Platelets: 511 10*3/uL — ABNORMAL HIGH (ref 150–400)
RBC: 3.69 MIL/uL — ABNORMAL LOW (ref 4.22–5.81)
RDW: 14.3 % (ref 11.5–15.5)
WBC: 9.7 10*3/uL (ref 4.0–10.5)
nRBC: 0 % (ref 0.0–0.2)

## 2020-10-13 LAB — OSMOLALITY, URINE: Osmolality, Ur: 634 mOsm/kg (ref 300–900)

## 2020-10-13 LAB — BASIC METABOLIC PANEL
Anion gap: 9 (ref 5–15)
BUN: 11 mg/dL (ref 8–23)
CO2: 23 mmol/L (ref 22–32)
Calcium: 8.6 mg/dL — ABNORMAL LOW (ref 8.9–10.3)
Chloride: 101 mmol/L (ref 98–111)
Creatinine, Ser: 0.85 mg/dL (ref 0.61–1.24)
GFR, Estimated: >60 mL/min (ref >60)
Glucose, Bld: 144 mg/dL — ABNORMAL HIGH (ref 70–99)
Potassium: 4 mmol/L (ref 3.5–5.1)
Sodium: 133 mmol/L — ABNORMAL LOW (ref 135–145)

## 2020-10-13 LAB — SODIUM, URINE, RANDOM: Sodium, Ur: 76 mmol/L

## 2020-10-13 LAB — C-REACTIVE PROTEIN: CRP: 12.6 mg/dL — ABNORMAL HIGH (ref <1.0)

## 2020-10-13 MED ORDER — SODIUM CHLORIDE 0.9 % IV SOLN
2.0000 g | INTRAVENOUS | Status: DC
  Administered 2020-10-13 – 2020-10-14 (×2): 2 g via INTRAVENOUS
  Filled 2020-10-13: qty 2
  Filled 2020-10-13: qty 20
  Filled 2020-10-13: qty 2

## 2020-10-13 MED ORDER — GADOBUTROL 1 MMOL/ML IV SOLN
8.0000 mL | Freq: Once | INTRAVENOUS | Status: AC | PRN
  Administered 2020-10-13: 8 mL via INTRAVENOUS

## 2020-10-13 NOTE — Progress Notes (Signed)
Spoke with Dr Earlene Plater and Silva Bandy RN re PICC order.  Blood cultures still pending, febrile this am temp 100.2, has PIV working well per [employer].  Per DrWallace, will hold PICC placement until blood cultures negative x 48 hours.

## 2020-10-13 NOTE — Progress Notes (Addendum)
HD#5 Subjective:  Overnight Events: None.  Patient reports feeling well this morning. He worked with PT yesterday, states it went well. He did not get much sleep. Felt hot overnight.    Objective:  Vital signs in last 24 hours: Vitals:   10/12/20 1549 10/12/20 2028 10/13/20 0500 10/13/20 0520  BP: 111/65 (!) 142/63 123/77   Pulse: 81 96 97   Resp: 17 17 18    Temp: 98.9 F (37.2 C) 98.2 F (36.8 C)  100.2 F (37.9 C)  TempSrc: Oral Oral Oral Oral  SpO2: 98% 99% 97%   Weight:      Height:       Supplemental O2: Nasal Cannula SpO2: 97 % O2 Flow Rate (L/min): 2 L/min   Physical Exam:  Physical Exam Constitutional:      Appearance: Normal appearance.  HENT:     Head: Normocephalic and atraumatic.     Right Ear: External ear normal.     Left Ear: External ear normal.     Mouth/Throat:     Mouth: Mucous membranes are moist.     Pharynx: Oropharynx is clear.  Eyes:     Extraocular Movements: Extraocular movements intact.     Conjunctiva/sclera: Conjunctivae normal.     Pupils: Pupils are equal, round, and reactive to light.  Cardiovascular:     Rate and Rhythm: Normal rate and regular rhythm.     Pulses: Normal pulses.     Heart sounds: Normal heart sounds.  Pulmonary:     Effort: Pulmonary effort is normal.     Breath sounds: Rhonchi: bilateral.  Abdominal:     General: Abdomen is flat. Bowel sounds are normal.     Palpations: Abdomen is soft.  Musculoskeletal:     Right lower leg: No edema.     Left lower leg: No edema.     Comments: Raised area of t5-t6 region, non painful, no drainage, no erythema new since yesterday  Skin:    General: Skin is warm and dry.     Capillary Refill: Capillary refill takes less than 2 seconds.  Neurological:     General: No focal deficit present.     Mental Status: He is alert and oriented to person, place, and time. Mental status is at baseline.     Comments: Sensation grossly intact, normal strength     Filed Weights    10/08/20 1121  Weight: 82.1 kg    Intake/Output Summary (Last 24 hours) at 10/13/2020 10/15/2020 Last data filed at 10/13/2020 0500 Gross per 24 hour  Intake 1688.86 ml  Output 2575 ml  Net -886.14 ml   Net IO Since Admission: -1,000.94 mL [10/13/20 0627]  Pertinent Labs: CBC Latest Ref Rng & Units 10/13/2020 10/12/2020 10/11/2020  WBC 4.0 - 10.5 K/uL 9.7 8.6 9.5  Hemoglobin 13.0 - 17.0 g/dL 10/13/2020) 1.9(Q) 2.2(W)  Hematocrit 39.0 - 52.0 % 30.0(L) 29.3(L) 30.0(L)  Platelets 150 - 400 K/uL 511(H) 528(H) 469(H)    CMP Latest Ref Rng & Units 10/13/2020 10/12/2020 10/11/2020  Glucose 70 - 99 mg/dL 10/13/2020) 892(J) 194(R)  BUN 8 - 23 mg/dL 11 10 9   Creatinine 0.61 - 1.24 mg/dL 740(C 1.44  Sodium 135 - 145 mmol/L 133(L) 132(L) 133(L)  Potassium 3.5 - 5.1 mmol/L 4.0 4.4 3.8  Chloride 98 - 111 mmol/L 101 100 100  CO2 22 - 32 mmol/L 23 22 21(L)  Calcium 8.9 - 10.3 mg/dL 8.18) 5.63) 8.3(L)  Total Protein 6.5 - 8.1 g/dL - - -  Total Bilirubin 0.3 - 1.2 mg/dL - - -  Alkaline Phos 38 - 126 U/L - - -  AST 15 - 41 U/L - - -  ALT 0 - 44 U/L - - -   Imaging: No results found.  Assessment/Plan:   Principal Problem:   Pulmonary emboli (HCC) Active Problems:   Osteomyelitis (HCC)   Type 2 diabetes mellitus without complication (HCC)   Discitis of thoracolumbar region   PICC (peripherally inserted central catheter) in place   DVT (deep venous thrombosis) (HCC)   Fever   Lobar pneumonia, unspecified organism [facility])  Patient Summary: Mr. Sahagun is a 65 y/o male with a past medical history of hypertension, diabetes, T5-6 discitis/osteomyletis currently on IV daptomycin and ceftriaxone presents for worsening back pain and shortness of breath found to have bilateral pulmonary emboli. Overall improving, but having persistent fevers.  Intermitted Fevers No fever overnight Tmax in last 24 hours of 100.2. No clear source for this fever. Treating with doxycycline to cover for atypical pneumonia given  CT findings. PICC with right axillary vein DVT removed. Will  continue on IV vancomycin and cetriaxione for back osteomyelitis. TB results pending. Daptomycin stopped due to concern for eosinophilic pneumonia. Sputum culture growing normal respiratory flora. Bacterial cultures no growth to date Overall fever curve seems to be improving. - ID consulted appreciate recommendations - Continue doxy, vancomycin, and rocephin - Trend fever curve  Provoked PE in setting on acute infection and RUE DVT 2/2 to PICC line Still producing small amount of blood tinged sputum. Remains comfortable on room air. PICC removed on 10/11/2020. Some abnormalities ien LA and protein C on hypercoagulable panel, consider repeating once off anticoagulation. Doing well currently.  - Continue apixiban for 3 months  - O2 as need to maintain saturation >90% - PT recs: rolling walker  T5-T6 Discitis/Osteomyelitis  Back pain stable on current pain medications. Plan to continue IV antibiotics until 3/8.  Removed PICC due to DVT will need to be replace prior to discharge. New bony protrusion at level of T5-T6 seen on exam today, non tender. Will repeat MRI to evaluate. - ID consulted, appreciate reccomendations - continue vanc, continue ceftriaxone - Follow up MRI -Tramadol 50 mg three time daily  PRN, acetaminophen every 6 hours PRN.  Hyponatremia Patient with mild hyponatremia  With Na of 132 on admission no 133 today. He is asymptomatic. Possibly in setting of decreased intake. Will check osm, unrine osm, and urin Na.  Hypertension - Continue home coreg and amlidipine - Hold lisinopril as he is normotensive   Diet: Heart Healthy Carb modified IVF: None VTE: Eliquis Code: Full  Dispo: Anticipated discharge Hom  in 2 days.  Quincy Simmonds, MD 10/13/2020, 6:27 AM Pager: [phone]  Please contact the on call pager after 5 pm and on weekends at [phone].

## 2020-10-13 NOTE — Plan of Care (Signed)
  Problem: Education: Goal: Knowledge of General Education information will improve Description: Including pain rating scale, medication(s)/side effects and non-pharmacologic comfort measures Outcome: Progressing   Problem: Health Behavior/Discharge Planning: Goal: Ability to manage health-related needs will improve Outcome: Progressing   Problem: Clinical Measurements: Goal: Ability to maintain clinical measurements within normal limits will improve Outcome: Progressing Goal: Will remain free from infection Outcome: Progressing Goal: Diagnostic test results will improve Outcome: Progressing Goal: Respiratory complications will improve Outcome: Progressing Goal: Cardiovascular complication will be avoided Outcome: Progressing   Problem: Activity: Goal: Risk for activity intolerance will decrease Outcome: Progressing   Problem: Nutrition: Goal: Adequate nutrition will be maintained Outcome: Progressing   Problem: Elimination: Goal: Will not experience complications related to urinary retention Outcome: Progressing   Problem: Pain Managment: Goal: General experience of comfort will improve Outcome: Progressing   Problem: Safety: Goal: Ability to remain free from injury will improve Outcome: Progressing   Problem: Skin Integrity: Goal: Risk for impaired skin integrity will decrease Outcome: Progressing   

## 2020-10-14 LAB — GLUCOSE, CAPILLARY
Glucose-Capillary: 106 mg/dL — ABNORMAL HIGH (ref 70–99)
Glucose-Capillary: 130 mg/dL — ABNORMAL HIGH (ref 70–99)
Glucose-Capillary: 134 mg/dL — ABNORMAL HIGH (ref 70–99)
Glucose-Capillary: 152 mg/dL — ABNORMAL HIGH (ref 70–99)
Glucose-Capillary: 157 mg/dL — ABNORMAL HIGH (ref 70–99)
Glucose-Capillary: 184 mg/dL — ABNORMAL HIGH (ref 70–99)

## 2020-10-14 LAB — BASIC METABOLIC PANEL
Anion gap: 10 (ref 5–15)
BUN: 13 mg/dL (ref 8–23)
CO2: 21 mmol/L — ABNORMAL LOW (ref 22–32)
Calcium: 8.6 mg/dL — ABNORMAL LOW (ref 8.9–10.3)
Chloride: 104 mmol/L (ref 98–111)
Creatinine, Ser: 0.75 mg/dL (ref 0.61–1.24)
GFR, Estimated: >60 mL/min (ref >60)
Glucose, Bld: 130 mg/dL — ABNORMAL HIGH (ref 70–99)
Potassium: 4 mmol/L (ref 3.5–5.1)
Sodium: 135 mmol/L (ref 135–145)

## 2020-10-14 LAB — CBC
HCT: 29.3 % — ABNORMAL LOW (ref 39.0–52.0)
Hemoglobin: 9.4 g/dL — ABNORMAL LOW (ref 13.0–17.0)
MCH: 25.5 pg — ABNORMAL LOW (ref 26.0–34.0)
MCHC: 32.1 g/dL (ref 30.0–36.0)
MCV: 79.6 fL — ABNORMAL LOW (ref 80.0–100.0)
Platelets: 573 10*3/uL — ABNORMAL HIGH (ref 150–400)
RBC: 3.68 MIL/uL — ABNORMAL LOW (ref 4.22–5.81)
RDW: 14.4 % (ref 11.5–15.5)
WBC: 8.8 10*3/uL (ref 4.0–10.5)
nRBC: 0 % (ref 0.0–0.2)

## 2020-10-14 LAB — CULTURE, BLOOD (ROUTINE X 2)
Culture: NO GROWTH
Culture: NO GROWTH
Special Requests: ADEQUATE
Special Requests: ADEQUATE

## 2020-10-14 LAB — OSMOLALITY: Osmolality: 284 mOsm/kg (ref 275–295)

## 2020-10-14 LAB — VANCOMYCIN, PEAK: Vancomycin Pk: 24 ug/mL — ABNORMAL LOW (ref 30–40)

## 2020-10-14 NOTE — Progress Notes (Signed)
At bedside with pt, Dr Elaina Pattee on telephone.  Concerns re OPAT PTA was on his last week at time of admission.  Due to RUE DVT, PE's, concern for need of PICC for IV ABT vs midline vs po antibiotics, decision made and agreed upon by all 3 present, to wait until pt can be seen by Dr Elinor Parkinson 10/15/20 am to make final decision for home needs and/ or type of line required, midline vs PICC line.

## 2020-10-14 NOTE — Plan of Care (Signed)

## 2020-10-14 NOTE — Plan of Care (Signed)
  Problem: Education: Goal: Knowledge of General Education information will improve Description: Including pain rating scale, medication(s)/side effects and non-pharmacologic comfort measures Outcome: Progressing   Problem: Health Behavior/Discharge Planning: Goal: Ability to manage health-related needs will improve Outcome: Progressing   Problem: Clinical Measurements: Goal: Ability to maintain clinical measurements within normal limits will improve Outcome: Progressing Goal: Respiratory complications will improve Outcome: Progressing   Problem: Nutrition: Goal: Adequate nutrition will be maintained Outcome: Progressing   Problem: Elimination: Goal: Will not experience complications related to bowel motility Outcome: Progressing Goal: Will not experience complications related to urinary retention Outcome: Progressing   Problem: Pain Managment: Goal: General experience of comfort will improve Outcome: Progressing   Problem: Safety: Goal: Ability to remain free from injury will improve Outcome: Progressing   Problem: Skin Integrity: Goal: Risk for impaired skin integrity will decrease Outcome: Progressing   Problem: Fluid Volume: Goal: Ability to maintain a balanced intake and output will improve Outcome: Progressing

## 2020-10-14 NOTE — Progress Notes (Addendum)
Subjective:   Patient is seen at bedside. He appears comfortable and in no acute distress. Patient states that he is feeling fine. Denies issues with breathing. No other complains. Discussed MRI result and PICC line placement.     Objective:  Vital signs in last 24 hours: Vitals:   10/13/20 1546 10/13/20 1600 10/13/20 1940 10/14/20 0347  BP: 105/60  104/61 114/68  Pulse: 89  90 93  Resp: 18  18 20   Temp: 98.9 F (37.2 C)  98.7 F (37.1 C) 99.4 F (37.4 C)  TempSrc: Oral  Oral Oral  SpO2:  96% 97% 96%  Weight:      Height:       Physical Exam Vitals reviewed.  Constitutional:      General: He is not in acute distress.    Appearance: Normal appearance. He is not ill-appearing.  HENT:     Head: Normocephalic and atraumatic.  Cardiovascular:     Rate and Rhythm: Normal rate and regular rhythm.     Heart sounds: Normal heart sounds. No murmur heard. No friction rub. No gallop.   Pulmonary:     Effort: Pulmonary effort is normal. No respiratory distress.     Breath sounds: Rales present. No wheezing.  Abdominal:     General: Abdomen is flat. Bowel sounds are normal. There is no distension.     Palpations: Abdomen is soft.     Tenderness: There is no abdominal tenderness. There is no guarding.  Musculoskeletal:        General: No tenderness.     Right lower leg: No edema.     Left lower leg: No edema.     Comments: Raised area of t5-t6 region, non painful, no drainage, no erythema new since yesterday   Skin:    General: Skin is warm and dry.  Neurological:     Mental Status: He is alert and oriented to person, place, and time.  Psychiatric:        Mood and Affect: Mood normal.        Behavior: Behavior normal.        Thought Content: Thought content normal.        Judgment: Judgment normal.     Assessment/Plan:  Principal Problem:   Pulmonary emboli (HCC) Active Problems:   Osteomyelitis (HCC)   Type 2 diabetes mellitus without complication (HCC)    Discitis of thoracolumbar region   PICC (peripherally inserted central catheter) in place   DVT (deep venous thrombosis) (HCC)   Fever   Lobar pneumonia, unspecified organism [facility])  Mr. Mode is a 65 y/o male with a past medical history of hypertension, diabetes, T5-6 discitis/osteomyletis currently on IV daptomycin and ceftriaxone presents for worsening back pain and shortness of breath found to have bilateral pulmonary emboli. Overall improving, but having persistent fevers.  Intermitted Fevers No fever overnight Tmax in last 24 hours of 100.2. No clear source for this fever. Treating with doxycycline to cover for atypical pneumonia given CT findings. PICC with right axillary vein DVT removed.  Daptomycin stopped due to concern for eosinophilic pneumonia.  Bacterial cultures no growth to date. Overall fever curve seems to be improving. - ID consulted appreciate recommendations - Continue doxy, vancomycin, and rocephin - Trend fever curve  Provoked PE in setting on acute infection and RUE DVT 2/2 to PICC line PICC removed on 10/11/2020. Some abnormalities ien LA and protein C on hypercoagulable panel, consider repeating once off anticoagulation.  - Continue apixiban for 3  months  - O2 as need to maintain saturation >90% - PT recs: rolling walker  T5-T6 Discitis/Osteomyelitis  Patient denies any back pain today.  Repeat MRI shows overall slight improvement of chronic discitis and osteomyelitis at T5-T6.  Also demonstrated possible interspinous ligamentous injury at T5-6 which is likely the protrusion seen on exam.  Per ID, plan was to continue IV antibiotics until 3/8.  Given the MRI shows overall improvement will re-discuss with ID if they still recommend to continue IV antibiotics.  Will need to put in another PICC line if that is the case and this may lead to increased risk of developing another acute clot. - ID consulted, appreciate reccomendations - continue vanc, continue  ceftriaxone -Tramadol 50 mg three time daily  PRN, acetaminophen every 6 hours PRN.  Hyponatremia Resolved.  Urine sodium and osmolality within normal limits.  Hypertension - Continue home coreg and amlidipine - Hold lisinopril as he is normotensive  Prior to Admission Living Arrangement: Home Anticipated Discharge Location: Home Barriers to Discharge: Clinical improvement  Dispo: Anticipated discharge in approximately 1-2 day(s).   Matalie Romberger N, DO 10/14/2020, 7:17 AM Pager: [phone] After 5pm on weekdays and 1pm on weekends: On Call pager [phone]

## 2020-10-15 ENCOUNTER — Other Ambulatory Visit: Payer: Self-pay | Admitting: Student

## 2020-10-15 DIAGNOSIS — R509 Fever, unspecified: Secondary | ICD-10-CM | POA: Diagnosis not present

## 2020-10-15 DIAGNOSIS — M4645 Discitis, unspecified, thoracolumbar region: Secondary | ICD-10-CM | POA: Diagnosis not present

## 2020-10-15 DIAGNOSIS — J181 Lobar pneumonia, unspecified organism: Secondary | ICD-10-CM | POA: Diagnosis not present

## 2020-10-15 DIAGNOSIS — I2699 Other pulmonary embolism without acute cor pulmonale: Secondary | ICD-10-CM | POA: Diagnosis not present

## 2020-10-15 DIAGNOSIS — M869 Osteomyelitis, unspecified: Secondary | ICD-10-CM

## 2020-10-15 LAB — GLUCOSE, CAPILLARY
Glucose-Capillary: 120 mg/dL — ABNORMAL HIGH (ref 70–99)
Glucose-Capillary: 124 mg/dL — ABNORMAL HIGH (ref 70–99)
Glucose-Capillary: 127 mg/dL — ABNORMAL HIGH (ref 70–99)

## 2020-10-15 LAB — BASIC METABOLIC PANEL
Anion gap: 9 (ref 5–15)
BUN: 14 mg/dL (ref 8–23)
CO2: 22 mmol/L (ref 22–32)
Calcium: 8.8 mg/dL — ABNORMAL LOW (ref 8.9–10.3)
Chloride: 106 mmol/L (ref 98–111)
Creatinine, Ser: 0.83 mg/dL (ref 0.61–1.24)
GFR, Estimated: >60 mL/min (ref >60)
Glucose, Bld: 108 mg/dL — ABNORMAL HIGH (ref 70–99)
Potassium: 4.2 mmol/L (ref 3.5–5.1)
Sodium: 137 mmol/L (ref 135–145)

## 2020-10-15 LAB — CBC
HCT: 30 % — ABNORMAL LOW (ref 39.0–52.0)
Hemoglobin: 9.2 g/dL — ABNORMAL LOW (ref 13.0–17.0)
MCH: 25.2 pg — ABNORMAL LOW (ref 26.0–34.0)
MCHC: 30.7 g/dL (ref 30.0–36.0)
MCV: 82.2 fL (ref 80.0–100.0)
Platelets: 581 10*3/uL — ABNORMAL HIGH (ref 150–400)
RBC: 3.65 MIL/uL — ABNORMAL LOW (ref 4.22–5.81)
RDW: 14.5 % (ref 11.5–15.5)
WBC: 8.3 10*3/uL (ref 4.0–10.5)
nRBC: 0 % (ref 0.0–0.2)

## 2020-10-15 LAB — QUANTIFERON-TB GOLD PLUS

## 2020-10-15 LAB — VANCOMYCIN, TROUGH: Vancomycin Tr: 12 ug/mL — ABNORMAL LOW (ref 15–20)

## 2020-10-15 LAB — PROTHROMBIN GENE MUTATION

## 2020-10-15 MED ORDER — INSULIN ASPART 100 UNIT/ML ~~LOC~~ SOLN
0.0000 [IU] | Freq: Three times a day (TID) | SUBCUTANEOUS | Status: DC
  Administered 2020-10-15: 1 [IU] via SUBCUTANEOUS

## 2020-10-15 MED ORDER — DOXYCYCLINE HYCLATE 100 MG PO TABS: 100.0000 mg | ORAL_TABLET | Freq: Two times a day (BID) | ORAL | 0 refills | Status: AC

## 2020-10-15 MED ORDER — APIXABAN 5 MG PO TABS: ORAL_TABLET | ORAL | 0 refills | Status: DC

## 2020-10-15 MED ORDER — INSULIN ASPART 100 UNIT/ML ~~LOC~~ SOLN: 0.0000 [IU] | Freq: Three times a day (TID) | SUBCUTANEOUS | Status: DC

## 2020-10-15 MED FILL — DOXYCYCLINE HYCLATE 100 MG: 100 | 1 days supply | Qty: 2 | Fill #0

## 2020-10-15 MED FILL — ELIQUIS 5 MG TABLET: 5 | 30 days supply | Qty: 62 | Fill #0

## 2020-10-15 NOTE — Progress Notes (Signed)
Pharmacy Antibiotic Note  Antonio Nash is a 65 y.o. male admitted on 10/08/2020 with osteomyelitis/discitis of the T5-6 region. He was originally scheduled to stop his antibiotics today but patient continues to be febrile.  Pharmacy has been consulted for vancomycin to have better pneumonia coverage in comparison to daptomycin. SCr- 0.78 with CrCl ~ 90 ml/min.    2/28 AM update:  Vancomycin AUC is within therapeutic range at 454 Renal function is stable  Plan: Cont vancomycin 1000 mg IV q12h Cont ceftriaxone 2g IV q24h Re-check vancomycin levels as needed  Height: 5\' 9"  (175.3 cm) Weight: 82.1 kg (181 lb) IBW/kg (Calculated) : 70.7  Temp (24hrs), Avg:98.9 F (37.2 C), Min:98.6 F (37 C), Max:99.4 F (37.4 C)  Recent Labs  Lab 10/08/20 1059 10/08/20 1257 10/09/20 0041 10/11/20 0357 10/12/20 0723 10/13/20 0107 10/14/20 0156 10/14/20 1606 10/15/20 0109  WBC 12.5*  --    < > 9.5 8.6 9.7 8.8  --  8.3  CREATININE 1.37*  --    < > 0.75 0.78 0.85 0.75  --  0.83  LATICACIDVEN 2.5* 0.7  --   --   --   --   --   --   --   VANCOTROUGH  --   --   --   --   --   --   --   --  12*  VANCOPEAK  --   --   --   --   --   --   --  24*  --    < > = values in this interval not displayed.    Estimated Creatinine Clearance: 87.5 mL/min (by C-G formula based on SCr of 0.83 mg/dL).    Allergies  Allergen Reactions  . Codeine Itching    Other reaction(s): Other (See Comments) Skin crawls  . Metformin Diarrhea    10/17/20, PharmD, BCPS Clinical Pharmacist Phone: [phone]

## 2020-10-15 NOTE — Progress Notes (Signed)
Patient discharged to home with instructions and medications from [facility] pharmacy. 

## 2020-10-15 NOTE — Discharge Summary (Addendum)
.   Name: Antonio Nash MRN: [id number] DOB: 1955/09/20 65 y.o. PCP: Patient, No Pcp Per  Date of Admission: 10/08/2020 10:38 AM Date of Discharge:   10/15/20  Attending Physician: Anne Shutter, MD  Discharge Diagnosis: 1.Provoked PE in setting on acute infection and RUE DVT 2/2 to PICC line 2. Intermittent fever 3.T5-T6 Discitis/Osteomyelitis  4. Hyponatremia   Discharge Medications: Allergies as of 10/15/2020       Reactions   Codeine Itching   Other reaction(s): Other (See Comments) Skin crawls   Metformin Diarrhea        Medication List     STOP taking these medications    cefTRIAXone  IVPB Commonly known as: ROCEPHIN   daptomycin  IVPB Commonly known as: CUBICIN   methocarbamol 500 MG tablet Commonly known as: ROBAXIN   traMADol 50 MG tablet Commonly known as: ULTRAM       TAKE these medications    acetaminophen 500 MG tablet Commonly known as: TYLENOL Take 500-1,000 mg by mouth every 6 (six) hours as needed for mild pain.   amLODipine 10 MG tablet Commonly known as: NORVASC Take 10 mg by mouth daily.   apixaban 5 MG Tabs tablet Commonly known as: ELIQUIS Take 2 tablets (10 mg total) by mouth 2 (two) times daily for 1 day, THEN 1 tablet (5 mg total) 2 (two) times daily. Start taking on: October 17, 2020   carvedilol 12.5 MG tablet Commonly known as: COREG Take 12.5 mg by mouth daily.   diphenhydrAMINE 25 MG tablet Commonly known as: BENADRYL Take 25 mg by mouth every 6 (six) hours as needed for allergies.   doxycycline 100 MG tablet Commonly known as: VIBRA-TABS Take 1 tablet (100 mg total) by mouth every 12 (twelve) hours for 1 day. Start taking on: October 16, 2020   empagliflozin 25 MG Tabs tablet Commonly known as: JARDIANCE Take 25 mg by mouth daily.   glimepiride 4 MG tablet Commonly known as: AMARYL Take 4 mg by mouth daily with breakfast.   lisinopril 40 MG tablet Commonly known as: ZESTRIL Take 40 mg by mouth  daily.               Durable Medical Equipment  (From admission, onward)           Start     Ordered   10/12/20 1731  For home use only DME Walker rolling  Once       Question Answer Comment  Walker: With 5 Inch Wheels   Patient needs a walker to treat with the following condition Hip pain      10/12/20 1730            Disposition and follow-up:   Antonio Nash was discharged from [facility] in Stable condition.  At the hospital follow up visit please address:  1.  PE: Provoked in setting of acute infection and RUE DVT from picc line. Picc line removed. Started on apixiban and will need to 3 months. Given 1 month supply. Will need to get through [facility] pharmacy. Follow up with PCP to arrange this. Has follow up with pulmonology in 6-8 weeks for CXR.Consider repeat hypercoagulable panel once off anticoagulation.   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 Completed 7 day course of doxycycline for possible atypical pneumonia. PICC removed. Fevers resolved, monitor for continued fever. Quantiferon QNS. Follow up with CXR and pulmonology in 6-8 weeks.  T5-T6 Discitis/Osteomyelitis:Completed 7 weeks IV  antibiotics during admission. Back pain much improved. Now off antibiotics, follow up with ID in 3 weeks.  2.  Labs / imaging needed at time of follow-up: CBC, BMP, CRP, consider quantiferon gold   3.  Pending labs/ test needing follow-up: None  Follow-up Appointments:  Follow-up Information     Hunsucker, Lesia Sago, MD. Schedule an appointment as soon as possible for a visit in 6 week(s).   Specialty: Pulmonary Disease Why: For PE follow up Contact information: [address] [place] [address] [phone]         Gardiner Barefoot, MD. Schedule an appointment as soon as possible for a visit in 3 week(s).   Specialty: Infectious Diseases Contact information: [address] [place] [address] [phone]                 Hospital Course by problem list:  Antonio Nash is a 65 y/o male with a past medical history of hypertension, diabetes, T5-6 discitis/osteomyletis currently on IV daptomycin and ceftriaxone presents for worsening back pain and shortness of breath found to have bilateral pulmonary emboli but found to persistent fever.   Provoked PE in setting on acute infection and RUE DVT 2/2 to PICC line Patient presented for 1 week of worsening pleuritic chest pain, bloody sputum and worsening dyspnea. Wife treated with home tramadol and tylenol attributed to back pain from thoracic osteomyelitis. CT with right pulmonary emboli with possible left subsegmental involvement. Borderline heart strain. Echo did not show significant strain. Started on IV heparin and transitioned to apixiban. Found to have RUE axillary vein DVT secondary to PICC line for IV antibiotics for t5-t6 osteomyelitis. PICC removed on 10/11/2020. Some abnormalities in LA and protein C on hypercoagulable panel, consider repeating once off anticoagulation. Filled 30 days of apixiban on discharge. Will need to follow up with PCP at [facility] to arrange to get prescriptions filled through [facility] pharmacy. Has pulmonology follow up in 6-8 weeks and repeat CXR.   Intermittent Fevers Persistently febrile at night during admission associated with night sweats. No clear source for this fever. Blood cultures negative. Respiratory sputum growing normal flora. UA unremarkable. Prior history of travel to [place] in distant past, no other travel or incarceration, low suspicion for TB. CT with bilateral patchy peripheral consolidations. Treating with doxycycline to cover for possible atypical pneumonia given CT findings, though RPP including mycoplasma and chlamydia was negative. PICC with right axillary vein DVT removed.  Daptomycin stopped due to concern for eosinophilic pneumonia. Fevers subsequently  improved. Fever free for 2 days on day of discharge. Quantiferon resulted QNS at discharge, consider retesting. Also consider work up for malignancy as he has had significant weight loss and no clear reason for his discitis/osteomyelitis. Pulmonology evaluated as well for bilateral patchy opacities, recommend follow up with them in 6-8 weeks with a CXR. Consider repeat chest CT to evaluate for resolution of patchy arispace disease.   T5-T6 Discitis/Osteomyelitis  Patient presenting with with worsening back pain. 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  Admitted between 08/31/2020 and 09/03/2020 for this. Had a paraspinal biopsy done at that time, cultures negative. Discharged on IV daptomycin and Ceftriaxone until 10/09/2020. Follow on 2/2 with Dr. Luciana Axe and doing well at that time. ID consulted on admission and recommended continued IV antibiotics due to fevers and elevated CRP from 8 to 18. Found to have RUE DVT from PICC, removed PICC due to DVT. Dapto switched to vanc due to concern for eosinophilic pneumonia  as above. MRI of T-spine with slight improvement from prior. Back pain much improved during hospitalization no longer needing tramadol or methocarbamol. No neurological symptoms of weakness or loss of sensation of bilateral lower extremities. New mid thoracic protrusion likely due to ligamentous damage from infection visualized on MRI as an interspinous ligament injury. IV antibiotics discontinued on day of discharge. Will need ID follow up in 3-4 weeks  Hyponatremia due to SIADH Patient with mild hyponatremia.  Na of 132 on admission. He is asymptomatic. Likely due to SIADH by urine studies, resolved during admission. Na or 137 pm day of discharge.   Subjective history past 24 hours: Patient sitting in chair on approach. Patient calls his wife on the cellphone to be part of the conversation. He reports he feels great. He denies feeling febrile . He reports he is able to ambulate with walker. We  will discuss antibiotic plan with ID and plan for discharge soon.   Discharge Exam:   BP 127/86 (BP Location: Right Arm)   Pulse 86   Temp 98.7 F (37.1 C) (Oral)   Resp 18   Ht  (1.753 m)   Wt 82.1 kg   SpO2 98%   BMI 26.73 kg/m  Discharge exam:  Physical Exam Constitutional:      Appearance: Normal appearance.  HENT:     Mouth/Throat:     Mouth: Mucous membranes are moist.     Pharynx: Oropharynx is clear.  Eyes:     Extraocular Movements: Extraocular movements intact.     Pupils: Pupils are equal, round, and reactive to light.  Cardiovascular:     Rate and Rhythm: Normal rate and regular rhythm.     Pulses: Normal pulses.     Heart sounds: Normal heart sounds.  Pulmonary:     Effort: Pulmonary effort is normal.     Breath sounds: Normal breath sounds.  Abdominal:     General: Abdomen is flat. Bowel sounds are normal.     Palpations: Abdomen is soft.  Musculoskeletal:     Right lower leg: No edema.     Left lower leg: No edema.     Comments: Kyphosis, mid thoracic protrusion, no redness or tenderness  Skin:    General: Skin is warm and dry.     Capillary Refill: Capillary refill takes less than 2 seconds.  Neurological:     General: No focal deficit present.     Mental Status: He is alert and oriented to person, place, and time. Mental status is at baseline.     Sensory: No sensory deficit.     Pertinent Labs, Studies, and Procedures:  CBC Latest Ref Rng & Units 10/15/2020 10/14/2020 10/13/2020  WBC 4.0 - 10.5 K/uL 8.3 8.8 9.7  Hemoglobin 13.0 - 17.0 g/dL 6.2(Z) 3.0(Q) 6.5(H)  Hematocrit 39.0 - 52.0 % 30.0(L) 29.3(L) 30.0(L)  Platelets 150 - 400 K/uL 581(H) 573(H) 511(H)   CMP Latest Ref Rng & Units 10/15/2020 10/14/2020 10/13/2020  Glucose 70 - 99 mg/dL 846(N) 629(B) 284(X)  BUN 8 - 23 mg/dL Creatinine 0.61 - 1.24 mg/dL 3.24 4.01 0.27  Sodium 135 - 145 mmol/L 137 135 133(L)  Potassium 3.5 - 5.1 mmol/L 4.2 4.0 4.0  Chloride 98 - 111 mmol/L 106  104 101  CO2 22 - 32 mmol/L 22 21(L) 23  Calcium 8.9 - 10.3 mg/dL 2.5(D) 6.6(Y) 4.0(H)  Total Protein 6.5 - 8.1 g/dL - - -  Total Bilirubin 0.3 - 1.2 mg/dL - - -  Alkaline Phos 38 - 126 U/L - - -  AST 15 - 41 U/L - - -  ALT 0 - 44 U/L - - -   CT Angio Chest PE W/Cm &/Or Wo Cm  Result Date: 10/08/2020 CLINICAL DATA:  Hemoptysis, abnormal chest x-ray EXAM: CT ANGIOGRAPHY CHEST WITH CONTRAST TECHNIQUE: Multidetector CT imaging of the chest was performed using the standard protocol during bolus administration of intravenous contrast. Multiplanar CT image reconstructions and MIPs were obtained to evaluate the vascular anatomy. CONTRAST:  50mL OMNIPAQUE IOHEXOL 350 MG/ML SOLN COMPARISON:  None. FINDINGS: Cardiovascular: Satisfactory opacification of the pulmonary arteries to the segmental level. Filling defects are present within right lobar, segmental, and subsegmental branches. No proximal filling defects on the left. Possible subsegmental branch defects on the left. Borderline right heart strain. No pericardial effusion. Mediastinum/Nodes: Prominent mildly enlarged mediastinal and hilar lymph nodes are likely reactive. For example, right pretracheal node near the carina measuring 1.3 cm short axis. Lungs/Pleura: Bilateral peripheral areas of consolidation and atelectasis primarily involving posterior upper lobes and superior lower lobes as well as lung bases. No pleural effusion. No pneumothorax. Upper Abdomen: Multiple low-attenuation liver lesions consistent with cysts. Musculoskeletal: Destructive changes at the opposing T5 and T6 vertebral bodies with paraspinal soft tissue. There is retropulsion of bone into the canal. Review of the MIP images confirms the above findings. IMPRESSION: Acute pulmonary emboli involving right lobar, segmental, and subsegmental branches. Possible left subsegmental involvement. Borderline right heart strain. Bilateral peripheral areas of consolidation and atelectasis. Likely  reflects pneumonia (COVID-19 is possible). A component of pulmonary infarction is also possible. Osteomyelitis discitis at T5-T6 with paraspinal soft tissue. Retropulsion of bone into the spinal canal likely effacing the ventral subarachnoid space. This would be better evaluated on MRI. These results were called by telephone at the time of interpretation on 10/08/2020 at 1:48 pm to provider [facility] , who verbally acknowledged these results. Electronically Signed   By: Guadlupe Spanish M.D.   On: 10/08/2020 13:57   DG Chest Port 1 View  Result Date: 10/08/2020 CLINICAL DATA:  Coughing up blood since yesterday.  Chronic cough. EXAM: PORTABLE CHEST 1 VIEW COMPARISON:  Radiographs 09/01/2020 and 07/16/2020. FINDINGS: 1152 hours. Right arm PICC projects to the mid to lower right atrial level. The heart size and mediastinal contours are stable. There are lower lung volumes with new patchy bibasilar opacities which may reflect inflammation, hemorrhage or atelectasis. There are probable upper lobe components as well, and the findings are suspicious for multilobar pneumonia. There is no edema, significant pleural effusion or pneumothorax. The bones appear unremarkable. IMPRESSION: Patchy bibasilar opacities and probable upper lobe components suspicious for multilobar pneumonia, potentially viral in etiology. Recommend radiographic follow-up to document clearing. Electronically Signed   By: Carey Bullocks M.D.   On: 10/08/2020 12:07   ECHOCARDIOGRAM COMPLETE  Result Date: 10/09/2020    ECHOCARDIOGRAM REPORT   Patient Name:   Antonio Nash Date of Exam: 10/09/2020 Medical Rec #:  [id number]       Height:       69.0 in Accession #:    [id number]      Weight:       181.0 lb Date of Birth:  03/16/1956      BSA:          1.980 m Patient Age:    64 years        BP:           122/62 mmHg Patient Gender: M  HR:           102 bpm. Exam Location:  Inpatient Procedure: 2D Echo, Color Doppler and Cardiac  Doppler Indications:    Pulmonary Embolus 415.19 / I26.99  History:        Patient has no prior history of Echocardiogram examinations.                 Risk Factors:Hypertension and Diabetes.  Sonographer:    Eulah PontSarah Pirrotta RDCS Referring Phys: [id number] Elwin MochaALEXANDER N RAINES IMPRESSIONS  1. Left ventricular ejection fraction, by estimation, is 55 to 60%. The left ventricle has normal function. The left ventricle has no regional wall motion abnormalities. Left ventricular diastolic parameters were normal.  2. Right ventricular systolic function is normal. The right ventricular size is normal. There is moderately elevated pulmonary artery systolic pressure.  3. Left atrial size was mildly dilated.  4. Right atrial size was mildly dilated.  5. The mitral valve is normal in structure. Trivial mitral valve regurgitation. No evidence of mitral stenosis.  6. The aortic valve is tricuspid. Aortic valve regurgitation is not visualized. No aortic stenosis is present.  7. The inferior vena cava is normal in size with greater than 50% respiratory variability, suggesting right atrial pressure of 3 mmHg. Comparison(s): No prior Echocardiogram. Conclusion(s)/Recommendation(s): Normal biventricular function without evidence of hemodynamically significant valvular heart disease. FINDINGS  Left Ventricle: Left ventricular ejection fraction, by estimation, is 55 to 60%. The left ventricle has normal function. The left ventricle has no regional wall motion abnormalities. The left ventricular internal cavity size was normal in size. There is  no left ventricular hypertrophy. Left ventricular diastolic parameters were normal. Right Ventricle: The right ventricular size is normal. No increase in right ventricular wall thickness. Right ventricular systolic function is normal. There is moderately elevated pulmonary artery systolic pressure. The tricuspid regurgitant velocity is 3.26 m/s, and with an assumed right atrial pressure of 3 mmHg, the  estimated right ventricular systolic pressure is 45.5 mmHg. Left Atrium: Left atrial size was mildly dilated. Right Atrium: Right atrial size was mildly dilated. Pericardium: There is no evidence of pericardial effusion. Mitral Valve: The mitral valve is normal in structure. Trivial mitral valve regurgitation. No evidence of mitral valve stenosis. Tricuspid Valve: The tricuspid valve is normal in structure. Tricuspid valve regurgitation is mild . No evidence of tricuspid stenosis. Aortic Valve: The aortic valve is tricuspid. Aortic valve regurgitation is not visualized. No aortic stenosis is present. Pulmonic Valve: The pulmonic valve was grossly normal. Pulmonic valve regurgitation is not visualized. No evidence of pulmonic stenosis. Aorta: The aortic root, ascending aorta, aortic arch and descending aorta are all structurally normal, with no evidence of dilitation or obstruction. Venous: The inferior vena cava is normal in size with greater than 50% respiratory variability, suggesting right atrial pressure of 3 mmHg. IAS/Shunts: The atrial septum is grossly normal.  LEFT VENTRICLE PLAX 2D LVIDd:         5.90 cm     Diastology LVIDs:         3.00 cm     LV e' medial:    7.94 cm/s LV PW:         0.70 cm     LV E/e' medial:  13.0 LV IVS:        0.60 cm     LV e' lateral:   12.30 cm/s LVOT diam:     2.20 cm     LV E/e' lateral: 8.4 LV SV:  69 LV SV Index:   35 LVOT Area:     3.80 cm  LV Volumes (MOD) LV vol d, MOD A2C: 99.7 ml LV vol d, MOD A4C: 85.5 ml LV vol s, MOD A2C: 40.3 ml LV vol s, MOD A4C: 42.1 ml LV SV MOD A2C:     59.4 ml LV SV MOD A4C:     85.5 ml LV SV MOD BP:      54.6 ml RIGHT VENTRICLE RV S prime:     16.20 cm/s TAPSE (M-mode): 1.8 cm LEFT ATRIUM             Index       RIGHT ATRIUM           Index LA diam:        4.00 cm 2.02 cm/m  RA Area:     15.70 cm LA Vol (A2C):   58.7 ml 29.64 ml/m RA Volume:   39.70 ml  20.05 ml/m LA Vol (A4C):   57.4 ml 28.99 ml/m LA Biplane Vol: 62.8 ml 31.71  ml/m  AORTIC VALVE LVOT Vmax:   108.00 cm/s LVOT Vmean:  72.400 cm/s LVOT VTI:    0.182 m  AORTA Ao Root diam: 3.00 cm Ao Asc diam:  2.90 cm MITRAL VALVE                TRICUSPID VALVE MV Area (PHT): 2.82 cm     TR Peak grad:   42.5 mmHg MV Decel Time: 269 msec     TR Vmax:        326.00 cm/s MV E velocity: 103.00 cm/s MV A velocity: 91.20 cm/s   SHUNTS MV E/A ratio:  1.13         Systemic VTI:  0.18 m                             Systemic Diam: 2.20 cm Jodelle Red MD Electronically signed by Jodelle Red MD Signature Date/Time: 10/09/2020/7:05:00 PM    Final    VAS [place] UPPER EXTREMITY VENOUS DUPLEX  Result Date: 10/09/2020 UPPER VENOUS STUDY  Indications: Acute pulmonary embolism, right arm PICC line. Anticoagulation: Heparin. Limitations: Bandaging at PICC line insertion site. Comparison Study: 10-08-2020 CTA chest was positive for acute pulmonary                   embolism. Performing Technologist: Jean Rosenthal RDMS  Examination Guidelines: A complete evaluation includes B-mode imaging, spectral Doppler, color Doppler, and power Doppler as needed of all accessible portions of each vessel. Bilateral testing is considered an integral part of a complete examination. Limited examinations for reoccurring indications may be performed as noted.  Right Findings: +----------+------------+---------+-----------+----------+---------------------+ RIGHT     CompressiblePhasicitySpontaneousProperties       Summary        +----------+------------+---------+-----------+----------+---------------------+ IJV           Full       Yes       Yes                                    +----------+------------+---------+-----------+----------+---------------------+ Subclavian    Full       Yes       Yes                                    +----------+------------+---------+-----------+----------+---------------------+  Axillary      None       No        No                                      +----------+------------+---------+-----------+----------+---------------------+ Brachial      Full                                    Proximal and mid                                                            segments not                                                            visualized due to                                                               PICC          +----------+------------+---------+-----------+----------+---------------------+ Radial        Full                                                        +----------+------------+---------+-----------+----------+---------------------+ Ulnar         Full                                                        +----------+------------+---------+-----------+----------+---------------------+ Cephalic      None       No        No                 Age Indeterminate   +----------+------------+---------+-----------+----------+---------------------+ Basilic                                                Not visualized     +----------+------------+---------+-----------+----------+---------------------+  Left Findings: +----------+------------+---------+-----------+----------+-------+ LEFT      CompressiblePhasicitySpontaneousPropertiesSummary +----------+------------+---------+-----------+----------+-------+ Subclavian    Full       Yes       Yes                      +----------+------------+---------+-----------+----------+-------+  Summary:  Right: Findings consistent with acute deep vein thrombosis involving the right axillary vein. Findings  consistent with age indeterminate superficial vein thrombosis involving the right cephalic vein.  Left: No evidence of thrombosis in the subclavian.  *See table(s) above for measurements and observations.  Diagnosing physician: Waverly Ferrari MD Electronically signed by Waverly Ferrari MD on 10/09/2020 at 6:28:56 PM.    Final    MRI  THORACIC WITHOUT AND WITH CONTRAST 10/13/2020  IMPRESSION: 1. Overall, there is improved appearance of the chronic discitis and osteomyelitis at T5-6 since prior MRI of 6 weeks ago. The degree of marrow edema in the vertebral bodies and posterior elements has slightly improved, and there is improved epidural enhancement and mass effect on the cord. 2. Possible interspinous ligamentous injury at T5-6 related to the osteomyelitis and associated kyphosis. No focal fluid collection. 3. Bilateral pulmonary infiltrates as seen on recent CT, consistent with multilobar pneumonia.  Discharge Instructions: Discharge Instructions     Call MD for:   Complete by: As directed    Numbness or weakness in the legs   Diet - low sodium heart healthy   Complete by: As directed    Discharge instructions   Complete by: As directed    You were hospitalized for blood clot in your lungs and started on eliquis. You will need to be on this for 3 month. Please follow up pulmonology for this. You will also [facility]. You were also found to have intermittent fevers. This may have been due to one the IV antibiotics you were on for your back infection. You MRI showed a slight improvement in the infection. We switched you IV medications and fever have stopped for the past 2 days. You will not need IV antibiotics once you leave the hospital. Please follow up with Dr. Luciana Axe in 3-4 weeks for your back infection. Please notify you doctor if you develop numbness or weakness in your feet as this may mean you infection is worsening. Thank you for allowing [place] to be part of your care.   Increase activity slowly   Complete by: As directed        Signed: Quincy Simmonds, MD 10/15/2020, 12:59 PM   Pager: [phone]

## 2020-10-15 NOTE — Progress Notes (Signed)
[facility]  Date of Admission:  10/08/2020      Total days of antibiotics 6  Doxycycline PO  Ceftriaxone IV   Vancomycin IV           ASSESSMENT: Antonio Nash is a 65 y.o. 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 He presented to the hospital with shortness of breath and thoracic back pain. Initially found to have recurrent high fevers and leukocytosis with rales / shortness of breath. Found to have had acute thrombus at PICC line and PE on chest CTA with evidence of RA strain. He did report some hemoptysis at home.   He has not had any fevers since 2/25 and work of breathing is back to normal on room air. Differential for fevers and SOB is either eosinophilic pneumonia from daptomycin or acute PE. Resonded very well with IV anticoagulation and now off daptomycin with significant improvement. We discussed options going forward and would not like to re-start IV antibiotics now given his original course was scheduled to be completed this week. MRI was repeated due to concern for ongoing fevers - revealed improved discitis @ T5-T6 regarding the discitis, consistent with his clinical response to therapy.  He is comfortable with discharge home on no antibiotics and monitoring his back with follow up in 3-4 weeks with Dr. Luciana Axe off antibiotics.   His wife and Mr. Grieco had the opportunity to ask questions and were comfortable with this plan.     PLAN: 1. Stop antibiotics after today's doses  2. OK to discharge from ID perspective  3. Anticoagulation to continue outpatient per primary team  4. FU with Dr. Luciana Axe re-arranged in 3 weeks on 3/23 @ 11:30 am.   Will sign off with patient discharge anticipated and continue care outpatient.     Principal Problem:   Pulmonary emboli (HCC) Active Problems:   Osteomyelitis (HCC)   Type 2 diabetes mellitus without  complication (HCC)   Discitis of thoracolumbar region   PICC (peripherally inserted central catheter) in place   DVT (deep venous thrombosis) (HCC)   Fever   Lobar pneumonia, unspecified organism (HCC)   . amLODipine  10 mg Oral Daily  . apixaban  10 mg Oral BID   Followed by  . [START ON 10/17/2020] apixaban  5 mg Oral BID  . carvedilol  12.5 mg Oral Daily  . doxycycline  100 mg Oral Q12H  . insulin aspart  0-9 Units Subcutaneous TID WC & HS    SUBJECTIVE: Antonio Nash reports that he has overall a good improvement in his back pain and his fevers have gone away. No new concerns aside from hoping he can go home as soon as possible. He called his wife, [name] to join [place] on the call.    Review of Systems: Review of Systems  Constitutional: Negative for chills, fever and malaise/fatigue.  Respiratory: Negative for cough and shortness of breath.   Cardiovascular: Negative for chest pain.  Gastrointestinal: Negative for abdominal pain.  Musculoskeletal: Negative for back pain, joint pain and myalgias.  Skin: Negative for rash.  Neurological: Negative for headaches.    Allergies  Allergen Reactions  . Codeine Itching    Other reaction(s): Other (See Comments) Skin crawls  . Metformin Diarrhea    OBJECTIVE: Vitals:   10/14/20 2001 10/14/20 2010 10/15/20 0402 10/15/20 1445  BP: 107/69  127/86 105/65  Pulse: 81  86 82  Resp: 17  18   Temp:  98.7 F (37.1 C) 98.7 F (37.1 C)   TempSrc:  Oral Oral   SpO2: 94%  98% 100%  Weight:      Height:       Body mass index is 26.73 kg/m.  Physical Exam Vitals and nursing note reviewed.  Constitutional:      Appearance: Normal appearance. He is not ill-appearing.     Comments: Sitting up in recliner, appears comfortable.   HENT:     Head: Normocephalic.     Mouth/Throat:     Mouth: Mucous membranes are moist.     Pharynx: Oropharynx is clear.  Eyes:     General: No scleral icterus. Cardiovascular:     Rate and Rhythm:  Normal rate and regular rhythm.     Heart sounds: No murmur heard.   Pulmonary:     Effort: Pulmonary effort is normal.  Musculoskeletal:        General: No tenderness. Normal range of motion.     Cervical back: Normal range of motion.  Skin:    General: Skin is warm and dry.     Coloration: Skin is not jaundiced or pale.  Neurological:     Mental Status: He is alert and oriented to person, place, and time.  Psychiatric:        Mood and Affect: Mood normal.        Judgment: Judgment normal.     Lab Results Lab Results  Component Value Date   WBC 8.3 10/15/2020   HGB 9.2 (L) 10/15/2020   HCT 30.0 (L) 10/15/2020   MCV 82.2 10/15/2020   PLT 581 (H) 10/15/2020    Lab Results  Component Value Date   CREATININE 0.83 10/15/2020   BUN 14 10/15/2020   NA 137 10/15/2020   K 4.2 10/15/2020   CL 106 10/15/2020   CO2 22 10/15/2020    Lab Results  Component Value Date   ALT 42 10/08/2020   AST 32 10/08/2020   ALKPHOS 42 10/08/2020   BILITOT 0.8 10/08/2020     Microbiology: Recent Results (from the past 240 hour(s))  Resp Panel by RT-PCR (Flu A&B, Covid) Nasopharyngeal Swab     Status: None   Collection Time: 10/08/20 12:39 PM   Specimen: Nasopharyngeal Swab; Nasopharyngeal(NP) swabs in vial transport medium  Result Value Ref Range Status   SARS Coronavirus 2 by RT PCR NEGATIVE NEGATIVE Final    Comment: (NOTE) SARS-CoV-2 target nucleic acids are NOT DETECTED.  The SARS-CoV-2 RNA is generally detectable in upper respiratory specimens during the acute phase of infection. The lowest concentration of SARS-CoV-2 viral copies this assay can detect is 138 copies/mL. A negative result does not preclude SARS-Cov-2 infection and should not be used as the sole basis for treatment or other patient management decisions. A negative result may occur with  improper specimen collection/handling, submission of specimen other than nasopharyngeal swab, presence of viral mutation(s)  within the areas targeted by this assay, and inadequate number of viral copies(<138 copies/mL). A negative result must be combined with clinical observations, patient history, and epidemiological information. The expected result is Negative.  Fact Sheet for Patients:  BloggerCourse.com  Fact Sheet for Healthcare Providers:  SeriousBroker.it  This test is no t yet approved or cleared by the [place] FDA and  has been authorized for detection and/or diagnosis of SARS-CoV-2 by FDA under an Emergency Use Authorization (EUA). This EUA will remain  in effect (meaning this test can be used) for the duration of the COVID-19 declaration under Section 564(b)(1) of the Act, 21 U.S.C.section 360bbb-3(b)(1), unless the authorization is terminated  or revoked sooner.       Influenza A by PCR NEGATIVE NEGATIVE Final   Influenza B by PCR NEGATIVE NEGATIVE Final    Comment: (NOTE) The Xpert Xpress SARS-CoV-2/FLU/RSV plus assay is intended as an aid in the diagnosis of influenza from Nasopharyngeal swab specimens and should not be used as a sole basis for treatment. Nasal washings and aspirates are unacceptable for Xpert Xpress SARS-CoV-2/FLU/RSV testing.  Fact Sheet for Patients: BloggerCourse.com  Fact Sheet for Healthcare Providers: SeriousBroker.it  This test is not yet approved or cleared by the [place] FDA and has been authorized for detection and/or diagnosis of SARS-CoV-2 by FDA under an Emergency Use Authorization (EUA). This EUA will remain in effect (meaning this test can be used) for the duration of the COVID-19 declaration under Section 564(b)(1) of the Act, 21 U.S.C. section 360bbb-3(b)(1), unless the authorization is terminated or revoked.  Performed at [facility], [address], [place], [place] [address]   Culture, blood (routine x 2)     Status: None    Collection Time: 10/09/20  6:20 PM   Specimen: BLOOD  Result Value Ref Range Status   Specimen Description BLOOD WRIST RIGHT  Final   Special Requests   Final    BOTTLES DRAWN AEROBIC AND ANAEROBIC Blood Culture adequate volume   Culture   Final    NO GROWTH 5 DAYS Performed at [facility], [address] [place], [place] [address]    Report Status 10/14/2020 FINAL  Final  Culture, blood (routine x 2)     Status: None   Collection Time: 10/09/20  6:25 PM   Specimen: BLOOD  Result Value Ref Range Status   Specimen Description BLOOD LEFT WRIST  Final   Special Requests   Final    BOTTLES DRAWN AEROBIC AND ANAEROBIC Blood Culture adequate volume   Culture   Final    NO GROWTH 5 DAYS Performed at [facility], [address], [place], [place] [address]    Report Status 10/14/2020 FINAL  Final  Expectorated Sputum Assessment w Gram Stain, Rflx to Resp Cult     Status: None   Collection Time: 10/09/20 10:08 PM   Specimen: Sputum  Result Value Ref Range Status   Specimen Description SPUTUM  Final   Special Requests NONE  Final   Sputum evaluation   Final    THIS SPECIMEN IS ACCEPTABLE FOR SPUTUM CULTURE Performed at [facility], [address], [place], [place] [address]    Report Status 10/09/2020 FINAL  Final  Culture, Respiratory w Gram Stain     Status: None   Collection Time: 10/09/20 10:08 PM   Specimen: SPU  Result Value Ref Range Status   Specimen Description SPUTUM  Final   Special Requests NONE Reflexed from [id number]  Final   Gram Stain   Final    ABUNDANT WBC PRESENT, PREDOMINANTLY PMN ABUNDANT GRAM POSITIVE COCCI ABUNDANT GRAM NEGATIVE RODS MODERATE YEAST FEW GRAM POSITIVE RODS    Culture   Final    Normal respiratory flora-no Staph aureus or Pseudomonas seen Performed at [facility], [address] [place], [place] [address]    Report Status 10/12/2020 FINAL  Final  Respiratory (~20 pathogens) panel by PCR     Status: None  Collection Time: 10/10/20  6:34 AM   Specimen: Nasopharyngeal Swab; Respiratory  Result Value Ref Range Status   Adenovirus NOT DETECTED NOT DETECTED Final   Coronavirus 229E NOT DETECTED NOT DETECTED Final    Comment: (NOTE) The Coronavirus on the Respiratory Panel, DOES NOT test for the novel  Coronavirus (2019 nCoV)    Coronavirus HKU1 NOT DETECTED NOT DETECTED Final   Coronavirus NL63 NOT DETECTED NOT DETECTED Final   Coronavirus OC43 NOT DETECTED NOT DETECTED Final   Metapneumovirus NOT DETECTED NOT DETECTED Final   Rhinovirus / Enterovirus NOT DETECTED NOT DETECTED Final   Influenza A NOT DETECTED NOT DETECTED Final   Influenza B NOT DETECTED NOT DETECTED Final   Parainfluenza Virus 1 NOT DETECTED NOT DETECTED Final   Parainfluenza Virus 2 NOT DETECTED NOT DETECTED Final   Parainfluenza Virus 3 NOT DETECTED NOT DETECTED Final   Parainfluenza Virus 4 NOT DETECTED NOT DETECTED Final   Respiratory Syncytial Virus NOT DETECTED NOT DETECTED Final   Bordetella pertussis NOT DETECTED NOT DETECTED Final   Bordetella Parapertussis NOT DETECTED NOT DETECTED Final   Chlamydophila pneumoniae NOT DETECTED NOT DETECTED Final   Mycoplasma pneumoniae NOT DETECTED NOT DETECTED Final    Comment: Performed at [facility], [address], [place], [place] [address]  SARS CORONAVIRUS 2 (TAT 6-24 HRS) Nasopharyngeal Nasopharyngeal Swab     Status: None   Collection Time: 10/10/20  6:14 PM   Specimen: Nasopharyngeal Swab  Result Value Ref Range Status   SARS Coronavirus 2 NEGATIVE NEGATIVE Final    Comment: (NOTE) SARS-CoV-2 target nucleic acids are NOT DETECTED.  The SARS-CoV-2 RNA is generally detectable in upper and lower respiratory specimens during the acute phase of infection. Negative results do not preclude SARS-CoV-2 infection, do not rule out co-infections with other pathogens, and should not be used as the sole basis for treatment or other patient management  decisions. Negative results must be combined with clinical observations, patient history, and epidemiological information. The expected result is Negative.  Fact Sheet for Patients: HairSlick.no  Fact Sheet for Healthcare Providers: quierodirigir.com  This test is not yet approved or cleared by the [place] FDA and  has been authorized for detection and/or diagnosis of SARS-CoV-2 by FDA under an Emergency Use Authorization (EUA). This EUA will remain  in effect (meaning this test can be used) for the duration of the COVID-19 declaration under Se ction 564(b)(1) of the Act, 21 U.S.C. section 360bbb-3(b)(1), unless the authorization is terminated or revoked sooner.  Performed at [facility], [address] [place], [place] [address]   Culture, blood (routine x 2)     Status: None (Preliminary result)   Collection Time: 10/12/20  8:49 AM   Specimen: BLOOD  Result Value Ref Range Status   Specimen Description BLOOD LEFT ANTECUBITAL  Final   Special Requests   Final    BOTTLES DRAWN AEROBIC AND ANAEROBIC Blood Culture adequate volume   Culture   Final    NO GROWTH 3 DAYS Performed at [facility], [address], [place], [place] [address]    Report Status PENDING  Incomplete  Culture, blood (routine x 2)     Status: None (Preliminary result)   Collection Time: 10/12/20  8:49 AM   Specimen: BLOOD  Result Value Ref Range Status   Specimen Description BLOOD RIGHT ANTECUBITAL  Final   Special Requests   Final    BOTTLES DRAWN AEROBIC AND ANAEROBIC Blood Culture adequate  volume   Culture   Final    NO GROWTH 3 DAYS Performed at [facility], [address] [place], [place] [address]    Report Status PENDING  Incomplete     Rexene AlbertsStephanie Christine Schiefelbein, MSN, NP-C [facility]  [email or web address] Pager: [phone] Office: [phone] [facility]  [place]: [phone]

## 2020-10-17 LAB — CULTURE, BLOOD (ROUTINE X 2)
Culture: NO GROWTH
Culture: NO GROWTH
Special Requests: ADEQUATE
Special Requests: ADEQUATE

## 2020-10-17 LAB — FACTOR 5 LEIDEN

## 2020-10-19 ENCOUNTER — Ambulatory Visit: Payer: No Typology Code available for payment source | Admitting: Internal Medicine

## 2020-10-30 DIAGNOSIS — Z7982 Long term (current) use of aspirin: Secondary | ICD-10-CM | POA: Diagnosis not present

## 2020-10-30 DIAGNOSIS — Z86718 Personal history of other venous thrombosis and embolism: Secondary | ICD-10-CM | POA: Diagnosis not present

## 2020-10-30 DIAGNOSIS — Z86711 Personal history of pulmonary embolism: Secondary | ICD-10-CM | POA: Diagnosis not present

## 2020-11-01 DIAGNOSIS — Z7984 Long term (current) use of oral hypoglycemic drugs: Secondary | ICD-10-CM | POA: Diagnosis not present

## 2020-11-01 DIAGNOSIS — M4640 Discitis, unspecified, site unspecified: Secondary | ICD-10-CM | POA: Diagnosis not present

## 2020-11-01 DIAGNOSIS — Z5181 Encounter for therapeutic drug level monitoring: Secondary | ICD-10-CM | POA: Diagnosis not present

## 2020-11-01 DIAGNOSIS — E119 Type 2 diabetes mellitus without complications: Secondary | ICD-10-CM | POA: Diagnosis not present

## 2020-11-01 DIAGNOSIS — Z86718 Personal history of other venous thrombosis and embolism: Secondary | ICD-10-CM | POA: Diagnosis not present

## 2020-11-01 DIAGNOSIS — M4624 Osteomyelitis of vertebra, thoracic region: Secondary | ICD-10-CM | POA: Diagnosis not present

## 2020-11-01 DIAGNOSIS — Z7901 Long term (current) use of anticoagulants: Secondary | ICD-10-CM | POA: Diagnosis not present

## 2020-11-07 ENCOUNTER — Encounter: Payer: Self-pay | Admitting: Internal Medicine

## 2020-11-07 ENCOUNTER — Ambulatory Visit (INDEPENDENT_AMBULATORY_CARE_PROVIDER_SITE_OTHER): Payer: No Typology Code available for payment source | Admitting: Internal Medicine

## 2020-11-07 ENCOUNTER — Ambulatory Visit
Admission: RE | Admit: 2020-11-07 | Discharge: 2020-11-07 | Disposition: A | Discharge status: Home / Self Care | Payer: BC Managed Care – PPO | Source: Ambulatory Visit | Attending: Internal Medicine | Admitting: Internal Medicine

## 2020-11-07 ENCOUNTER — Other Ambulatory Visit: Payer: Self-pay

## 2020-11-07 VITALS — BP 130/78 | HR 80 | Wt 188.0 lb

## 2020-11-07 DIAGNOSIS — J181 Lobar pneumonia, unspecified organism: Secondary | ICD-10-CM

## 2020-11-07 DIAGNOSIS — M869 Osteomyelitis, unspecified: Secondary | ICD-10-CM

## 2020-11-07 DIAGNOSIS — M4645 Discitis, unspecified, thoracolumbar region: Secondary | ICD-10-CM

## 2020-11-07 DIAGNOSIS — R918 Other nonspecific abnormal finding of lung field: Secondary | ICD-10-CM | POA: Diagnosis not present

## 2020-11-07 DIAGNOSIS — I2699 Other pulmonary embolism without acute cor pulmonale: Secondary | ICD-10-CM

## 2020-11-07 DIAGNOSIS — M1612 Unilateral primary osteoarthritis, left hip: Secondary | ICD-10-CM

## 2020-11-07 DIAGNOSIS — Z8701 Personal history of pneumonia (recurrent): Secondary | ICD-10-CM | POA: Diagnosis not present

## 2020-11-07 NOTE — Assessment & Plan Note (Signed)
He clinically is doing very well from this standpoint with increasing movement, less pain and recent repeat MRi with improved findings.  I discussed with the patient and wife that a repeat MRI is not indicated unless he is worsening and since he has been off antibiotics for nearly a month, it is most likely resolved.  He will call with any new concerns. rtc as needed.

## 2020-11-07 NOTE — Assessment & Plan Note (Signed)
He is hopeful for a hip replacement and from an ID standpoint there is no contraindication to avoid surgery so ok to proceed when appropriate per Dr. Roda Shutters

## 2020-11-07 NOTE — Progress Notes (Signed)
   Subjective:    Patient ID: Antonio Nash, male    DOB: [birth year], 65 y.o.   MRN: [id number]  HPI Here for hsfu. He had several months of back pain and went to see Dr. Yevette Edwards and MRI c/w T5-6 discitis/osteomyelitis, though film or report not available to me.  He was sent in for management in the hospital and underwent IR guided biopsy, placement of picc line and started on vancomycin and ceftriaxone.    He was later changed to daptomycin but later presented to the hospital with a PE and also complicated by possible eosinophilic pneumonia thought due to daptomycin. He had nearly completed treatment and decision was made to stop antibiotics which ended about 4 weeks ago.  At this time, he is doing better with less pain, moving more and overall good.  He is here with his wife.  No sob and no fever.     He is hopeful for a hip replacement at some point.   He has not had a follow up with Dr. Judeth Horn of pulmonary yet.     Review of Systems  Constitutional: Negative for fever.  Gastrointestinal: Negative for diarrhea and nausea.  Skin: Negative for rash.       Objective:   Physical Exam Constitutional:      Appearance: Normal appearance.  Eyes:     General: No scleral icterus. Pulmonary:     Effort: Pulmonary effort is normal. No respiratory distress.     Breath sounds: Normal breath sounds. No wheezing or rales.  Neurological:     General: No focal deficit present.  Psychiatric:        Mood and Affect: Mood normal.   SH: no tobacco        Assessment & Plan:

## 2020-11-07 NOTE — Assessment & Plan Note (Signed)
He continues on anticoagulation and will follow up with pulmonary.

## 2020-11-07 NOTE — Assessment & Plan Note (Addendum)
Possible eosinophilic pneumonia from dapto.  No issues now and I will repeat the CXR.  If no concerns, rtc as needed.  He will call for follow up with Dr. Judeth Horn.

## 2020-11-08 ENCOUNTER — Telehealth: Payer: Self-pay

## 2020-11-08 NOTE — Telephone Encounter (Signed)
Patient's wife is calling stating the patient is requesting a letter to return to work. Please advise Antonio Nash T Pricilla Loveless

## 2020-11-09 ENCOUNTER — Other Ambulatory Visit: Payer: Self-pay

## 2020-11-09 NOTE — Telephone Encounter (Signed)
Yes, ok to provide a return to work letter dated now with no restrictions. thanks

## 2020-11-09 NOTE — Telephone Encounter (Signed)
Letter has been printed and signed by Rolena Infante, RN.  Patient advised his letter is ready to be picked up and will be left upfront. Patient may come by today or Monday to pick up the letter. Antonio Nash

## 2020-11-15 ENCOUNTER — Encounter: Payer: Self-pay | Admitting: Orthopaedic Surgery

## 2020-11-15 ENCOUNTER — Other Ambulatory Visit: Payer: Self-pay

## 2020-11-15 ENCOUNTER — Ambulatory Visit: Payer: BC Managed Care – PPO | Admitting: Orthopaedic Surgery

## 2020-11-15 VITALS — Ht 69.0 in | Wt 188.0 lb

## 2020-11-15 DIAGNOSIS — I2699 Other pulmonary embolism without acute cor pulmonale: Secondary | ICD-10-CM | POA: Diagnosis not present

## 2020-11-15 DIAGNOSIS — M1612 Unilateral primary osteoarthritis, left hip: Secondary | ICD-10-CM

## 2020-11-15 NOTE — Progress Notes (Signed)
Office Visit Note   Patient: Antonio Nash           Date of Birth: 1955/11/04           MRN: [id number] Visit Date: 11/15/2020              Requested by: No referring provider defined for this encounter. PCP: Patient, No Pcp Per (Inactive)   Assessment & Plan: Visit Diagnoses:  1. Primary osteoarthritis of left hip   2. Other acute pulmonary embolism, unspecified whether acute cor pulmonale present (HCC)     Plan: Impression is end-stage left hip DJD.  He is likely currently not a surgical candidate due to recent diagnosis of PE and initiation of treatment on Eliquis.  We will set him up with an injection with Dr. Prince Rome for the left hip.  He understands he can have these every 3 to 4 months as long as he is getting relief.  He will follow up with [place] when he is cleared to have surgery from a DVT, PE standpoint.  Total face to face encounter time was greater than 25 minutes and over half of this time was spent in counseling and/or coordination of care.  Follow-Up Instructions: Return if symptoms worsen or fail to improve.   Orders:  No orders of the defined types were placed in this encounter.  No orders of the defined types were placed in this encounter.     Procedures: No procedures performed   Clinical Data: No additional findings.   Subjective: Chief Complaint  Patient presents with  . Left Hip - Follow-up    Mr. Ledford returns today for follow-up of chronic severe left hip pain due to DJD.  Fortunately he was recently admitted to the hospital for pneumonia and DVT in his right upper extremity that resulted from a PICC line and he also developed PEs as well.  He is currently on Eliquis.  He is ambulating with a single crutch.  He has been fully treated for his chronic discitis and has been off of antibiotics for a month.  He continues to have severe left hip pain which makes ADLs extremely difficult.  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   Review of Systems  Constitutional: Negative.   All other systems reviewed and are negative.    Objective: Vital Signs: Ht 5\' 9"  (1.753 m)   Wt 188 lb (85.3 kg)   BMI 27.76 kg/m   Physical Exam Vitals and nursing note reviewed.  Constitutional:      Appearance: He is well-developed.  Pulmonary:     Effort: Pulmonary effort is normal.  Abdominal:     Palpations: Abdomen is soft.  Skin:    General: Skin is warm.  Neurological:     Mental Status: He is alert and oriented to person, place, and time.  Psychiatric:        Behavior: Behavior normal.        Thought Content: Thought content normal.        Judgment: Judgment normal.     Ortho Exam Left hip exam shows severe pain with attempted range of motion.  Moderate restriction in range of motion. Specialty Comments:  No specialty comments available.  Imaging: No results found.   PMFS History: Patient Active Problem List   Diagnosis Date Noted  . Lobar pneumonia, unspecified organism (HCC)   . DVT (deep venous thrombosis) (HCC) 10/09/2020  . Pulmonary emboli (HCC) 10/08/2020  .  Primary osteoarthritis of left hip 10/02/2020  . Discitis of thoracolumbar region 09/19/2020  . Medication monitoring encounter 09/19/2020  . Osteomyelitis (HCC) 08/31/2020  . HTN (hypertension) 08/31/2020  . Type 2 diabetes mellitus without complication (HCC) 08/31/2020   Past Medical History:  Diagnosis Date  . Diabetes mellitus without complication (HCC)   . Hypertension   . PE (pulmonary thromboembolism) (HCC) 10/09/2020    History reviewed. No pertinent family history.  Past Surgical History:  Procedure Laterality Date  . COLONOSCOPY    . HERNIA REPAIR    . ROTATOR CUFF REPAIR     Social History   Occupational History  . Not on file  Tobacco Use  . Smoking status: Never Smoker  . Smokeless tobacco: Never Used  Vaping Use  . Vaping Use: Never used  Substance and Sexual Activity  . Alcohol use: Not  Currently  . Drug use: Never  . Sexual activity: Not on file

## 2020-11-19 ENCOUNTER — Ambulatory Visit: Payer: Self-pay

## 2020-11-19 ENCOUNTER — Other Ambulatory Visit: Payer: Self-pay

## 2020-11-19 ENCOUNTER — Encounter: Payer: Self-pay | Admitting: Family Medicine

## 2020-11-19 ENCOUNTER — Ambulatory Visit (INDEPENDENT_AMBULATORY_CARE_PROVIDER_SITE_OTHER): Payer: BC Managed Care – PPO | Admitting: Family Medicine

## 2020-11-19 DIAGNOSIS — M1612 Unilateral primary osteoarthritis, left hip: Secondary | ICD-10-CM

## 2020-11-19 NOTE — Progress Notes (Signed)
Subjective: Patient is here for ultrasound-guided intra-articular left hip injection.   Pain from DJD.  Objective:  Limited ROM with IR.  Procedure: Ultrasound guided injection is preferred based studies that show increased duration, increased effect, greater accuracy, decreased procedural pain, increased response rate, and decreased cost with ultrasound guided versus blind injection.   Verbal informed consent obtained.  Time-out conducted.  Noted no overlying erythema, induration, or other signs of local infection. Ultrasound-guided left hip injection: After sterile prep with Betadine, injected 4 cc 0.25% bupivacaine without epinephrine and 6 mg betamethasone using a 22-gauge spinal needle, passing the needle through the iliofemoral ligament into the femoral head/neck junction.  Injectate seen filling joint capsule.

## 2020-11-22 ENCOUNTER — Encounter: Payer: Self-pay | Admitting: Pulmonary Disease

## 2020-11-22 ENCOUNTER — Other Ambulatory Visit: Payer: Self-pay

## 2020-11-22 ENCOUNTER — Ambulatory Visit (INDEPENDENT_AMBULATORY_CARE_PROVIDER_SITE_OTHER): Payer: BC Managed Care – PPO | Admitting: Pulmonary Disease

## 2020-11-22 VITALS — BP 114/74 | HR 71 | Temp 98.0°F | Ht 69.0 in | Wt 189.8 lb

## 2020-11-22 DIAGNOSIS — I2699 Other pulmonary embolism without acute cor pulmonale: Secondary | ICD-10-CM | POA: Diagnosis not present

## 2020-11-22 DIAGNOSIS — J181 Lobar pneumonia, unspecified organism: Secondary | ICD-10-CM | POA: Diagnosis not present

## 2020-11-22 NOTE — Patient Instructions (Addendum)
Nice to see you again  We will continue the Eliquis for blood thinner through May 31.  You can stop after that.  No need for additional refills or pills after May 31.  I thought the chest x-ray looked a bit better than the CT scan when you were in the hospital.  The radiologist was concerned about a couple spots on the right, this is where the majority of the blood clot was.  I think more than likely these shadows represent inflammation related to the blood clot.  However, to be extra sure I have ordered a CT scan of your chest to follow-up the spots that were seen while you are in the hospital as well as in the most recent chest x-ray.  Depending on the results of the CT scan we may or may not need to see you back in clinic.  Please call me if I can help in any way in the future or if new symptoms emerge.

## 2020-11-22 NOTE — Progress Notes (Signed)
_0  ID: Antonio Nash, male    DOB: [birth year], 65 y.o.   MRN: [id number]  Chief Complaint  Patient presents with  . Hospitalization Follow-up    Feeling some better, breathing is stable    Referring provider: No ref. provider found  HPI:   36 65 year old whom we are seen in follow-up of hospitalization for PE, pulmonary infiltrates on CT scan.  Discharge summary reviewed.  Most recent infectious disease note reviewed.  Patient has PICC line in place draining spine infection.  He came to ED short of breath.  CTA with primarily right and possible left pulmonary embolus.  No evidence of right heart strain on TTE.  Thought to be related to PICC line.  Started on anticoagulation.  Pulmonary is consulted given the bilateral infiltrates.  CTA at that time of admission reviewed which shows right greater than left peripheral groundglass opacities felt most likely to be infarction monitor patient.  He did have some preceding cough with doxycycline was prescribed to treat atypical pneumonia.  His Covid test was negative.  He had chest x-ray 11/08/2020 [place] view showed clear lungs with faint right-sided peripheral infiltrates, overall the number last normal CT scan at time of hospitalization on my interpretation.  Overall he feels well.  No issues with taking Eliquis.  No hemoptysis.  He had mild increased cough, nasal congestion in the mornings over the last couple weeks with increased pollen.  This clears throughout the day.  Is using some nasal sprays.  No fever or chills.  No weight loss.   Questionaires / Pulmonary Flowsheets:   ACT:  No flowsheet data found.  MMRC: No flowsheet data found.  Epworth:  No flowsheet data found.  Tests:   FENO:  No results found for: NITRICOXIDE  PFT: No flowsheet data found.  WALK:  No flowsheet data found.  Imaging: Personally reviewed and as per EMR discussion this note DG Chest 2 View  Result Date: 11/08/2020 CLINICAL DATA:   Follow-up pneumonia. EXAM: CHEST - 2 VIEW COMPARISON:  10/08/2020 FINDINGS: Bilateral patchy ground-glass opacity seen previously has resolved on the left. There is some evolution of findings in the right lung with new nodular ground-glass opacities in the right upper and lower lung. No pleural effusion. The cardiopericardial silhouette is within normal limits for size. The visualized bony structures of the thorax show no acute abnormality. IMPRESSION: Clearing of left lung airspace opacity with new nodular ground-glass opacities in the right upper and lower lung. Findings are indeterminate may reflect evolution of post infectious/inflammatory disease noted on the prior imaging. Short-term interval follow-up chest x-ray recommended to ensure resolution. Electronically Signed   By: Misty Stanley M.D.   On: 11/08/2020 13:57   US Guided Needle Placement - No Linked Charges  Result Date: 11/19/2020 Ultrasound guided injection is preferred based studies that show increased duration, increased effect, greater accuracy, decreased procedural pain, increased response rate, and decreased cost with ultrasound guided versus blind injection.   Verbal informed consent obtained.  Time-out conducted.  Noted no overlying erythema, induration, or other signs of local infection. Ultrasound-guided left hip injection: After sterile prep with Betadine, injected 4 cc 0.25% bupivacaine without epinephrine and 6 mg betamethasone using a 22-gauge spinal needle, passing the needle through the iliofemoral ligament into the femoral head/neck junction.  Injectate seen filling joint capsule.     Lab Results:  CBC    Component Value Date/Time   WBC 8.3 10/15/2020 0109   RBC 3.65 (L) 10/15/2020 0109  HGB 9.2 (L) 10/15/2020 0109   HCT 30.0 (L) 10/15/2020 0109   PLT 581 (H) 10/15/2020 0109   MCV 82.2 10/15/2020 0109   MCH 25.2 (L) 10/15/2020 0109   MCHC 30.7 10/15/2020 0109   RDW 14.5 10/15/2020 0109   LYMPHSABS 1.2 10/11/2020  0357   MONOABS 1.1 (H) 10/11/2020 0357   EOSABS 0.3 10/11/2020 0357   BASOSABS 0.0 10/11/2020 0357    BMET    Component Value Date/Time   NA 137 10/15/2020 0109   K 4.2 10/15/2020 0109   CL 106 10/15/2020 0109   CO2 22 10/15/2020 0109   GLUCOSE 108 (H) 10/15/2020 0109   BUN 14 10/15/2020 0109   CREATININE 0.83 10/15/2020 0109   CALCIUM 8.8 (L) 10/15/2020 0109   GFRNONAA >60 10/15/2020 0109   GFRAA  04/25/2008 1555    >60        The eGFR has been calculated using the MDRD equation. This calculation has not been validated in all clinical    BNP No results found for: BNP  ProBNP No results found for: PROBNP  Specialty Problems      Pulmonary Problems   Lobar pneumonia, unspecified organism (HCC)      Allergies  Allergen Reactions  . Codeine Itching    Other reaction(s): Other (See Comments) Skin crawls  . Metformin Diarrhea    Immunization History  Administered Date(s) Administered  . Influenza Split 06/18/2017, 07/02/2017, 06/18/2018  . Influenza-Unspecified 05/19/2019  . PFIZER(Purple Top)SARS-COV-2 Vaccination 12/19/2019, 01/09/2020  . Pneumococcal Polysaccharide-23 09/24/2017    Past Medical History:  Diagnosis Date  . Diabetes mellitus without complication ([place])   . Hypertension   . PE (pulmonary thromboembolism) ([place]) 10/09/2020    Tobacco History: Social History   Tobacco Use  Smoking Status Never Smoker  Smokeless Tobacco Never Used   Counseling given: Yes   Continue to not smoke  Outpatient Encounter Medications as of 11/22/2020  Medication Sig  . acetaminophen (TYLENOL) 500 MG tablet Take 500-1,000 mg by mouth every 6 (six) hours as needed for mild pain.  Marland Kitchen amLODipine (NORVASC) 10 MG tablet Take 10 mg by mouth daily.  Marland Kitchen apixaban (ELIQUIS) 5 MG TABS tablet TAKE 2 TABLETS (10 MG TOTAL) BY MOUTH TWO TIMES DAILY FOR 1 DAY, THEN 1 TABLET (5 MG TOTAL) TWO TIMES DAILY.  Marland Kitchen aspirin 81 MG chewable tablet CHEW ONE TABLET BY MOUTH DAILY  .  capsaicin (ZOSTRIX) 0.025 % cream APPLY SMALL AMOUNT TO AFFECTED AREA TWICE A DAY AS NEEDED  . carvedilol (COREG) 12.5 MG tablet Take 12.5 mg by mouth daily.  . diphenhydrAMINE (BENADRYL) 25 MG tablet Take 25 mg by mouth every 6 (six) hours as needed for allergies.  Marland Kitchen doxycycline (VIBRA-TABS) 100 MG tablet TAKE 1 TABLET (100 MG TOTAL) BY MOUTH EVERY TWELVE HOURS FOR 1 DAY.  Marland Kitchen empagliflozin (JARDIANCE) 25 MG TABS tablet Take 25 mg by mouth daily.  Marland Kitchen glimepiride (AMARYL) 4 MG tablet Take 4 mg by mouth daily with breakfast.  . lisinopril (ZESTRIL) 40 MG tablet Take 40 mg by mouth daily.  . sildenafil (VIAGRA) 50 MG tablet TAKE ONE-HALF TABLET BY MOUTH AS DIRECTED (TAKE 1 HOUR PRIOR TO SEXUAL ACTIVITY *DO NOT EXCEED 1 DOSE PER 24 HOUR PERIOD*)  . traMADol (ULTRAM) 50 MG tablet Take 1 tablet by mouth 3 (three) times daily as needed.  . [DISCONTINUED] apixaban (ELIQUIS) 5 MG TABS tablet Take 2 tablets (10 mg total) by mouth 2 (two) times daily for 1 day, THEN 1  tablet (5 mg total) 2 (two) times daily.   No facility-administered encounter medications on file as of 11/22/2020.     Review of Systems  Review of Systems  No chest pain with exertion.  No lower extreme swelling.  Comprehensive review systems otherwise negative.  Physical Exam  BP 114/74 (BP Location: Left Arm, Cuff Size: Normal)   Pulse 71   Temp 98 F (36.7 C) (Oral)   Ht _0  (1.753 m)   Wt 189 lb 12.8 oz (86.1 kg)   SpO2 99%   BMI 28.03 kg/m   Wt Readings from Last 5 Encounters:  11/22/20 189 lb 12.8 oz (86.1 kg)  11/15/20 188 lb (85.3 kg)  11/07/20 188 lb (85.3 kg)  10/08/20 181 lb (82.1 kg)  10/02/20 192 lb (87.1 kg)    BMI Readings from Last 5 Encounters:  11/22/20 28.03 kg/m  11/15/20 27.76 kg/m  11/07/20 27.76 kg/m  10/08/20 26.73 kg/m  10/02/20 28.35 kg/m     Physical Exam General: Well-appearing, no distress Eyes: EOMI, no icterus Neck: supple, No JVP Pulmonary: CTAB, no wheezes or crackles CV:  RRR, no murmurs Abd: Soft, ND, BS present MSK: no synovitis, No joint effusion Neuro: No weakness, normal gait Psych: Normal mood, full affect  Assessment & Plan:   Provoked PE: In the setting of PICC line in place.  Plan for 3 months of anticoagulation and Jan 15, 2021.  Explained in detail the patient. No evidence of R heart strain. Will not pursue repeat TTE.  Pulmonary infiltrates: L CTA PE protocol while admitted to the hospital.  Likely reflective of pulmonary infarction.  Repeat chest x-ray 11/08/2020 with improvement but a couple of residual areas.  Will order CT scan without contrast for further evaluation.  Note that was treated for 7 days of doxycycline given report of productive cough that preceded admission for pulmonary embolus.   Return if symptoms worsen or fail to improve.   Lanier Clam, MD 11/22/2020

## 2020-11-22 NOTE — Addendum Note (Signed)
Addended byVilma Meckel on: 11/22/2020 10:12 AM   Modules accepted: Level of Service

## 2020-11-26 ENCOUNTER — Other Ambulatory Visit (HOSPITAL_BASED_OUTPATIENT_CLINIC_OR_DEPARTMENT_OTHER): Payer: Self-pay

## 2020-12-03 ENCOUNTER — Other Ambulatory Visit: Payer: Self-pay

## 2020-12-03 ENCOUNTER — Ambulatory Visit (INDEPENDENT_AMBULATORY_CARE_PROVIDER_SITE_OTHER)
Admission: RE | Admit: 2020-12-03 | Discharge: 2020-12-03 | Disposition: A | Discharge status: Home / Self Care | Payer: No Typology Code available for payment source | Source: Ambulatory Visit | Attending: Pulmonary Disease | Admitting: Pulmonary Disease

## 2020-12-03 DIAGNOSIS — Z86711 Personal history of pulmonary embolism: Secondary | ICD-10-CM | POA: Diagnosis not present

## 2020-12-03 DIAGNOSIS — J181 Lobar pneumonia, unspecified organism: Secondary | ICD-10-CM

## 2020-12-03 DIAGNOSIS — J984 Other disorders of lung: Secondary | ICD-10-CM | POA: Diagnosis not present

## 2020-12-03 DIAGNOSIS — I2510 Atherosclerotic heart disease of native coronary artery without angina pectoris: Secondary | ICD-10-CM | POA: Diagnosis not present

## 2020-12-03 DIAGNOSIS — Z8701 Personal history of pneumonia (recurrent): Secondary | ICD-10-CM | POA: Diagnosis not present

## 2020-12-05 NOTE — Progress Notes (Signed)
CT scan nearly totally clear.

## 2020-12-07 ENCOUNTER — Other Ambulatory Visit: Payer: No Typology Code available for payment source

## 2020-12-14 ENCOUNTER — Inpatient Hospital Stay: Payer: No Typology Code available for payment source | Admitting: Pulmonary Disease

## 2020-12-17 ENCOUNTER — Ambulatory Visit (HOSPITAL_COMMUNITY)
Admission: EM | Admit: 2020-12-17 | Discharge: 2020-12-17 | Disposition: A | Discharge status: Home / Self Care | Payer: No Typology Code available for payment source | Attending: Physician Assistant | Admitting: Physician Assistant

## 2020-12-17 ENCOUNTER — Encounter (HOSPITAL_COMMUNITY): Payer: Self-pay

## 2020-12-17 ENCOUNTER — Other Ambulatory Visit: Payer: Self-pay

## 2020-12-17 ENCOUNTER — Ambulatory Visit (INDEPENDENT_AMBULATORY_CARE_PROVIDER_SITE_OTHER): Payer: No Typology Code available for payment source

## 2020-12-17 DIAGNOSIS — S82831A Other fracture of upper and lower end of right fibula, initial encounter for closed fracture: Secondary | ICD-10-CM | POA: Diagnosis not present

## 2020-12-17 DIAGNOSIS — M25571 Pain in right ankle and joints of right foot: Secondary | ICD-10-CM | POA: Diagnosis not present

## 2020-12-17 DIAGNOSIS — X501XXA Overexertion from prolonged static or awkward postures, initial encounter: Secondary | ICD-10-CM

## 2020-12-17 DIAGNOSIS — M7989 Other specified soft tissue disorders: Secondary | ICD-10-CM | POA: Diagnosis not present

## 2020-12-17 NOTE — ED Triage Notes (Signed)
Pt reports pain and swelling in the right ankle, after he twisted 2 days ago. Ibuprofen gives some relief, last dose 7 am today.

## 2020-12-17 NOTE — ED Notes (Signed)
Ortho paged. 

## 2020-12-17 NOTE — Progress Notes (Signed)
Orthopedic Tech Progress Note Patient Details:  Antonio Nash [birth year] [id number]  Ortho Devices Type of Ortho Device: Short leg splint Ortho Device/Splint Location: RLE Ortho Device/Splint Interventions: Ordered,Application,Adjustment   Post Interventions Patient Tolerated: Well Instructions Provided: Care of device   Donald Pore 12/17/2020, 1:59 PM

## 2020-12-17 NOTE — ED Notes (Signed)
X-rays printed fro pt, states Orthopedic office requested him to bring a copy.

## 2020-12-17 NOTE — ED Provider Notes (Signed)
[facility]    CSN: [id number] Arrival date & time: 12/17/20  1058      History   Chief Complaint Chief Complaint  Patient presents with  . Foot Pain    HPI Antonio Nash is a 65 y.o. male.   Patient presents today with a 2-day history of right ankle pain following injury.  Reports that he was in normal position and took a step which caused sudden inversion of right ankle.  He reports pain and swelling since that time.  With attempted ambulation pain is rated 8 on a 0-10 pain scale but rated 4 at rest, localized to the lateral right ankle without radiation, described as aching periodic sharp pains, worse with certain movements, no alleviating factors identified.  He has tried ibuprofen without improvement of symptoms.  Denies previous injury or surgery to ankle.  He denies any numbness or paresthesias in foot/toes.  He has tried ice without improvement of symptoms.     Past Medical History:  Diagnosis Date  . Diabetes mellitus without complication (HCC)   . Hypertension   . PE (pulmonary thromboembolism) (HCC) 10/09/2020    Patient Active Problem List   Diagnosis Date Noted  . Lobar pneumonia, unspecified organism (HCC)   . DVT (deep venous thrombosis) (HCC) 10/09/2020  . Pulmonary emboli (HCC) 10/08/2020  . Primary osteoarthritis of left hip 10/02/2020  . Discitis of thoracolumbar region 09/19/2020  . Medication monitoring encounter 09/19/2020  . Osteomyelitis (HCC) 08/31/2020  . HTN (hypertension) 08/31/2020  . Type 2 diabetes mellitus without complication (HCC) 08/31/2020    Past Surgical History:  Procedure Laterality Date  . COLONOSCOPY    . HERNIA REPAIR    . ROTATOR CUFF REPAIR         Home Medications    Prior to Admission medications   Medication Sig Start Date End Date Taking? Authorizing Provider  acetaminophen (TYLENOL) 500 MG tablet Take 500-1,000 mg by mouth every 6 (six) hours as needed for mild pain.    [provider]   amLODipine (NORVASC) 10 MG tablet Take 10 mg by mouth daily.    [provider]  apixaban (ELIQUIS) 5 MG TABS tablet TAKE 2 TABLETS (10 MG TOTAL) BY MOUTH TWO TIMES DAILY FOR 1 DAY, THEN 1 TABLET (5 MG TOTAL) TWO TIMES DAILY. 10/15/20 10/15/21  Quincy Simmonds, MD  aspirin 81 MG chewable tablet CHEW ONE TABLET BY MOUTH DAILY 06/23/17   [provider]  capsaicin (ZOSTRIX) 0.025 % cream APPLY SMALL AMOUNT TO AFFECTED AREA TWICE A DAY AS NEEDED 10/25/20   [provider]  carvedilol (COREG) 12.5 MG tablet Take 12.5 mg by mouth daily. 08/03/17   [provider]  diphenhydrAMINE (BENADRYL) 25 MG tablet Take 25 mg by mouth every 6 (six) hours as needed for allergies.    [provider]  doxycycline (VIBRA-TABS) 100 MG tablet TAKE 1 TABLET (100 MG TOTAL) BY MOUTH EVERY TWELVE HOURS FOR 1 DAY. 10/15/20 10/15/21  Quincy Simmonds, MD  empagliflozin (JARDIANCE) 25 MG TABS tablet Take 25 mg by mouth daily.    [provider]  glimepiride (AMARYL) 4 MG tablet Take 4 mg by mouth daily with breakfast.    [provider]  lisinopril (ZESTRIL) 40 MG tablet Take 40 mg by mouth daily. 08/03/17   [provider]  sildenafil (VIAGRA) 50 MG tablet TAKE ONE-HALF TABLET BY MOUTH AS DIRECTED (TAKE 1 HOUR PRIOR TO SEXUAL ACTIVITY *DO NOT EXCEED 1 DOSE PER 24 HOUR PERIOD*)  03/19/20   [provider]  traMADol (ULTRAM) 50 MG tablet Take 1 tablet by mouth 3 (three) times daily as needed. 10/02/20   [provider]    Family History History reviewed. No pertinent family history.  Social History Social History   Tobacco Use  . Smoking status: Never Smoker  . Smokeless tobacco: Never Used  Vaping Use  . Vaping Use: Never used  Substance Use Topics  . Alcohol use: Not Currently  . Drug use: Never     Allergies   Codeine and Metformin   Review of Systems Review of Systems  Constitutional: Positive for activity change. Negative for  appetite change, fatigue and fever.  Respiratory: Negative for cough and shortness of breath.   Cardiovascular: Negative for chest pain.  Gastrointestinal: Negative for abdominal pain, diarrhea, nausea and vomiting.  Musculoskeletal: Positive for arthralgias, gait problem and joint swelling. Negative for myalgias.  Neurological: Negative for dizziness, weakness, light-headedness, numbness and headaches.     Physical Exam Triage Vital Signs ED Triage Vitals  Enc Vitals Group     BP 12/17/20 1139 137/71     Pulse Rate 12/17/20 1139 71     Resp 12/17/20 1139 19     Temp 12/17/20 1139 98.1 F (36.7 C)     Temp Source 12/17/20 1139 Oral     SpO2 12/17/20 1139 100 %     Weight --      Height --      Head Circumference --      Peak Flow --      Pain Score 12/17/20 1137 8     Pain Loc --      Pain Edu? --      Excl. in GC? --    No data found.  Updated Vital Signs BP 137/71 (BP Location: Right Arm)   Pulse 71   Temp 98.1 F (36.7 C) (Oral)   Resp 19   SpO2 100%   Visual Acuity Right Eye Distance:   Left Eye Distance:   Bilateral Distance:    Right Eye Near:   Left Eye Near:    Bilateral Near:     Physical Exam Vitals reviewed.  Constitutional:      General: He is awake.     Appearance: Normal appearance. He is normal weight. He is not ill-appearing.     Comments: Very pleasant male appears stated age in no acute distress  HENT:     Head: Normocephalic and atraumatic.  Cardiovascular:     Rate and Rhythm: Normal rate and regular rhythm.     Heart sounds: No murmur heard.     Comments: Capillary refill within 2 seconds bilateral toes.  2+ pitting edema to right ankle; no edema, left Pulmonary:     Effort: Pulmonary effort is normal.     Breath sounds: Normal breath sounds. No stridor. No wheezing, rhonchi or rales.     Comments: Clear to auscultation bilaterally Abdominal:     Palpations: Abdomen is soft.     Tenderness: There is no abdominal tenderness.   Musculoskeletal:     Right ankle: Swelling present. No deformity. Tenderness present over the lateral malleolus. Decreased range of motion.     Comments: Edema noted at right ankle.  No deformity noted.  Normal active range of motion but pain with dorsiflexion and plantarflexion.  Tenderness palpation over lateral malleolus.  Neurological:     Mental Status: He is alert.  Psychiatric:  Behavior: Behavior is cooperative.      UC Treatments / Results  Labs (all labs ordered are listed, but only abnormal results are displayed) Labs Reviewed - No data to display  EKG   Radiology DG Ankle Complete Right  Result Date: 12/17/2020 CLINICAL DATA:  Right ankle pain and swelling after twisting injury 2 days ago. EXAM: RIGHT ANKLE - COMPLETE 3+ VIEW COMPARISON:  None. FINDINGS: Acute nondisplaced oblique fracture of the distal fibular metadiaphysis. No additional fracture. The ankle mortise is symmetric. The talar dome is intact. 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 Large os trigonum. Joint spaces are preserved. Mild lateral hindfoot soft tissue swelling. IMPRESSION: 1. Acute nondisplaced oblique fracture of the distal fibular metadiaphysis. 2. Non-osseous talocalcaneal coalition. Electronically Signed   By: Obie Dredge M.D.   On: 12/17/2020 13:05    Procedures Procedures (including critical care time)  Medications Ordered in UC Medications - No data to display  Initial Impression / Assessment and Plan / UC Course  I have reviewed the triage vital signs and the nursing notes.  Pertinent labs & imaging results that were available during my care of the patient were reviewed by me and considered in my medical decision making (see chart for details).     X-ray obtained given tenderness to lateral malleolus based on [place] ankle rules.  X-ray showed acute nondisplaced oblique fracture of distal fibula.  Patient was placed in short leg splint with  instruction to follow-up with orthopedics ASAP.  He can continue Tylenol over-the-counter for pain relief.  He is unable to take NSAIDs due to chronic anticoagulation.  Strict return precautions given to which patient expressed understanding.  Final Clinical Impressions(s) / UC Diagnoses   Final diagnoses:  Acute right ankle pain  Closed fracture of distal end of right fibula, unspecified fracture morphology, initial encounter     Discharge Instructions     There is a fracture at the end of your fibula.  We have put you in a splint and you need to follow-up with orthopedics ASAP.  Please call them soon as you leave today to schedule an appointment.  You can use Tylenol for pain relief.  If you have any worsening symptoms please return for reevaluation.    ED Prescriptions    None     PDMP not reviewed this encounter.   Jeani Hawking, PA-C 12/17/20 1325

## 2020-12-17 NOTE — Discharge Instructions (Signed)
There is a fracture at the end of your fibula.  We have put you in a splint and you need to follow-up with orthopedics ASAP.  Please call them soon as you leave today to schedule an appointment.  You can use Tylenol for pain relief.  If you have any worsening symptoms please return for reevaluation.

## 2020-12-21 ENCOUNTER — Other Ambulatory Visit: Payer: Self-pay

## 2020-12-21 ENCOUNTER — Ambulatory Visit: Payer: BC Managed Care – PPO | Admitting: Orthopaedic Surgery

## 2020-12-21 ENCOUNTER — Encounter: Payer: Self-pay | Admitting: Orthopaedic Surgery

## 2020-12-21 VITALS — Ht 69.0 in | Wt 189.0 lb

## 2020-12-21 DIAGNOSIS — S8264XA Nondisplaced fracture of lateral malleolus of right fibula, initial encounter for closed fracture: Secondary | ICD-10-CM

## 2020-12-21 DIAGNOSIS — S8261XA Displaced fracture of lateral malleolus of right fibula, initial encounter for closed fracture: Secondary | ICD-10-CM | POA: Insufficient documentation

## 2020-12-21 NOTE — Progress Notes (Signed)
Office Visit Note   Patient: Antonio Nash           Date of Birth: Dec 29, 1955           MRN: [id number] Visit Date: 12/21/2020              Requested by: No referring provider defined for this encounter. PCP: Patient, No Pcp Per (Inactive)   Assessment & Plan: Visit Diagnoses:  1. Nondisplaced fracture of lateral malleolus of right fibula, initial encounter for closed fracture     Plan: Impression is nondisplaced right distal fibula fracture.  I think it is best to treat this fracture nonoperatively if possible given the circumstances of him still being active treatment for a PE.  I stressed the importance of remaining nonweightbearing.  I do not feel that he is safe to be on just crutches therefore I have given him a prescription for a knee scooter.  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  We will recheck his ankle in 4 weeks with three-view x-rays of the right ankle.  Follow-Up Instructions: Return in about 4 weeks (around 01/18/2021).   Orders:  No orders of the defined types were placed in this encounter.  No orders of the defined types were placed in this encounter.     Procedures: No procedures performed   Clinical Data: No additional findings.   Subjective: Chief Complaint  Patient presents with  . Right Ankle - Injury, Pain    DOI 12/15/2020    Mr. Hennick comes in today for evaluation of a new injury to his right ankle.  He 6 days ago and injured his ankle and a few days later after it did not improve he had x-rays at the [facility] urgent care which showed a spiral fracture of the lateral malleolus.  He has been weightbearing with a splint and crutches during this time.  He takes ibuprofen and Tylenol.  He continues to be on Eliquis for DVT and PE.   Review of Systems  Constitutional: Negative.   All other systems reviewed and are  negative.    Objective: Vital Signs: Ht 5\' 9"  (1.753 m)   Wt 189 lb (85.7 kg)   BMI 27.91 kg/m   Physical Exam Vitals and nursing note reviewed.  Constitutional:      Appearance: He is well-developed.  Pulmonary:     Effort: Pulmonary effort is normal.  Abdominal:     Palpations: Abdomen is soft.  Skin:    General: Skin is warm.  Neurological:     Mental Status: He is alert and oriented to person, place, and time.  Psychiatric:        Behavior: Behavior normal.        Thought Content: Thought content normal.        Judgment: Judgment normal.     Ortho Exam Right ankle shows mild swelling.  Slight tenderness along the distal fibula.  No neurovascular compromise. Specialty Comments:  No specialty comments available.  Imaging: No results found.   PMFS History: Patient Active Problem List   Diagnosis Date Noted  . Displaced fracture of lateral malleolus of right fibula, initial encounter for closed fracture 12/21/2020  . Lobar pneumonia, unspecified organism (HCC)   . DVT (deep venous thrombosis) (HCC) 10/09/2020  . Pulmonary emboli (HCC) 10/08/2020  . Primary osteoarthritis of left hip 10/02/2020  . Discitis  of thoracolumbar region 09/19/2020  . Medication monitoring encounter 09/19/2020  . Osteomyelitis (HCC) 08/31/2020  . HTN (hypertension) 08/31/2020  . Type 2 diabetes mellitus without complication (HCC) 08/31/2020   Past Medical History:  Diagnosis Date  . Diabetes mellitus without complication (HCC)   . Hypertension   . PE (pulmonary thromboembolism) (HCC) 10/09/2020    History reviewed. No pertinent family history.  Past Surgical History:  Procedure Laterality Date  . COLONOSCOPY    . HERNIA REPAIR    . ROTATOR CUFF REPAIR     Social History   Occupational History  . Not on file  Tobacco Use  . Smoking status: Never Smoker  . Smokeless tobacco: Never Used  Vaping Use  . Vaping Use: Never used  Substance and Sexual Activity  . Alcohol use:  Not Currently  . Drug use: Never  . Sexual activity: Not on file

## 2021-01-04 DIAGNOSIS — M4644 Discitis, unspecified, thoracic region: Secondary | ICD-10-CM | POA: Diagnosis not present

## 2021-01-04 DIAGNOSIS — Z7901 Long term (current) use of anticoagulants: Secondary | ICD-10-CM | POA: Diagnosis not present

## 2021-01-04 DIAGNOSIS — Z86711 Personal history of pulmonary embolism: Secondary | ICD-10-CM | POA: Diagnosis not present

## 2021-01-04 DIAGNOSIS — M25552 Pain in left hip: Secondary | ICD-10-CM | POA: Diagnosis not present

## 2021-01-18 ENCOUNTER — Ambulatory Visit (INDEPENDENT_AMBULATORY_CARE_PROVIDER_SITE_OTHER): Payer: BC Managed Care – PPO | Admitting: Orthopaedic Surgery

## 2021-01-18 ENCOUNTER — Ambulatory Visit: Payer: Self-pay

## 2021-01-18 ENCOUNTER — Encounter: Payer: Self-pay | Admitting: Orthopaedic Surgery

## 2021-01-18 DIAGNOSIS — S8264XA Nondisplaced fracture of lateral malleolus of right fibula, initial encounter for closed fracture: Secondary | ICD-10-CM | POA: Diagnosis not present

## 2021-01-18 NOTE — Progress Notes (Signed)
   Post-Op Visit Note   Patient: Antonio Nash           Date of Birth: Jul 19, 1956           MRN: [id number] Visit Date: 01/18/2021 PCP: Patient, No Pcp Per (Inactive)   Assessment & Plan:  Chief Complaint:  Chief Complaint  Patient presents with  . Right Ankle - Pain   Visit Diagnoses:  1. Nondisplaced fracture of lateral malleolus of right fibula, initial encounter for closed fracture     Plan:   Antonio Nash returns today for follow-up of right lateral malleolus fracture.  He admits to fully weightbearing with crutches since we last saw him and in the last week he has stopped using crutches altogether.  He feels some discomfort when changing directions but overall pain is well controlled.  Right ankle shows mild swelling.  He has no bony tenderness.  Mild discomfort with external and internal rotation of the ankle.  X-rays show stable alignment fracture with signs of healing.  At this point we will place him in an ASO brace.  I offered physical therapy for gait training but the patient has already been ambulating this entire time therefore I do not really see a benefit to it.  Work note was provided for light duty for 6 weeks.  Recheck in 6 weeks with three-view x-rays of the right ankle.   Follow-Up Instructions: Return in about 6 weeks (around 03/01/2021).   Orders:  Orders Placed This Encounter  Procedures  . XR Ankle Complete Right   No orders of the defined types were placed in this encounter.   Imaging: XR Ankle Complete Right  Result Date: 01/18/2021 Stable alignment of nondisplaced fibula fracture.  There is evidence of some early fracture healing.   PMFS History: Patient Active Problem List   Diagnosis Date Noted  . Displaced fracture of lateral malleolus of right fibula, initial encounter for closed fracture 12/21/2020  . Lobar pneumonia, unspecified organism (HCC)   . DVT (deep venous thrombosis) (HCC) 10/09/2020  . Pulmonary emboli (HCC) 10/08/2020  .  Primary osteoarthritis of left hip 10/02/2020  . Discitis of thoracolumbar region 09/19/2020  . Medication monitoring encounter 09/19/2020  . Osteomyelitis (HCC) 08/31/2020  . HTN (hypertension) 08/31/2020  . Type 2 diabetes mellitus without complication (HCC) 08/31/2020   Past Medical History:  Diagnosis Date  . Diabetes mellitus without complication (HCC)   . Hypertension   . PE (pulmonary thromboembolism) (HCC) 10/09/2020    History reviewed. No pertinent family history.  Past Surgical History:  Procedure Laterality Date  . COLONOSCOPY    . HERNIA REPAIR    . ROTATOR CUFF REPAIR     Social History   Occupational History  . Not on file  Tobacco Use  . Smoking status: Never Smoker  . Smokeless tobacco: Never Used  Vaping Use  . Vaping Use: Never used  Substance and Sexual Activity  . Alcohol use: Not Currently  . Drug use: Never  . Sexual activity: Not on file

## 2021-03-01 ENCOUNTER — Ambulatory Visit (INDEPENDENT_AMBULATORY_CARE_PROVIDER_SITE_OTHER): Payer: BC Managed Care – PPO

## 2021-03-01 ENCOUNTER — Other Ambulatory Visit: Payer: Self-pay

## 2021-03-01 ENCOUNTER — Encounter: Payer: Self-pay | Admitting: Orthopaedic Surgery

## 2021-03-01 ENCOUNTER — Ambulatory Visit (INDEPENDENT_AMBULATORY_CARE_PROVIDER_SITE_OTHER): Payer: BC Managed Care – PPO | Admitting: Orthopaedic Surgery

## 2021-03-01 DIAGNOSIS — S8264XA Nondisplaced fracture of lateral malleolus of right fibula, initial encounter for closed fracture: Secondary | ICD-10-CM

## 2021-03-01 NOTE — Progress Notes (Signed)
     Patient: Antonio Nash           Date of Birth: 09-May-1956           MRN: [id number] Visit Date: 03/01/2021 PCP: Patient, No Pcp Per (Inactive)   Assessment & Plan:  Chief Complaint:  Chief Complaint  Patient presents with   Right Ankle - Pain   Visit Diagnoses:  1. Nondisplaced fracture of lateral malleolus of right fibula, initial encounter for closed fracture     Plan: Percy returns today for follow-up of his right lateral malleolus fracture.  He has no complaints regarding this.  He has been quite active without any problems from the ankle.  Right ankle shows mild swelling.  His range of motion has improved significantly.  There is no bony tenderness to the fibula.  His x-rays have demonstrated near complete fracture healing.  Clinically he is doing very well.  He is released to activity as tolerated.  He can continue to use the neoprene ankle sleeve during activity.  He will follow-up with me in a couple weeks to talk about scheduling for a left total hip replacement.  We will obtain the necessary surgical clearance from Dr. Judeth Horn who is a pulmonologist that managed his Eliquis for history of DVT and PE.   Follow-Up Instructions: Return if symptoms worsen or fail to improve.   Orders:  Orders Placed This Encounter  Procedures   XR Ankle Complete Right   No orders of the defined types were placed in this encounter.   Imaging: XR Ankle Complete Right  Result Date: 03/01/2021 Significant healing to the lateral malleolus fracture   PMFS History: Patient Active Problem List   Diagnosis Date Noted   Displaced fracture of lateral malleolus of right fibula, initial encounter for closed fracture 12/21/2020   Lobar pneumonia, unspecified organism (HCC)    DVT (deep venous thrombosis) (HCC) 10/09/2020   Pulmonary emboli (HCC) 10/08/2020   Primary osteoarthritis of left hip 10/02/2020   Discitis of thoracolumbar region 09/19/2020   Medication monitoring encounter  09/19/2020   Osteomyelitis (HCC) 08/31/2020   HTN (hypertension) 08/31/2020   Type 2 diabetes mellitus without complication (HCC) 08/31/2020   Past Medical History:  Diagnosis Date   Diabetes mellitus without complication (HCC)    Hypertension    PE (pulmonary thromboembolism) (HCC) 10/09/2020    History reviewed. No pertinent family history.  Past Surgical History:  Procedure Laterality Date   COLONOSCOPY     HERNIA REPAIR     ROTATOR CUFF REPAIR     Social History   Occupational History   Not on file  Tobacco Use   Smoking status: Never   Smokeless tobacco: Never  Vaping Use   Vaping Use: Never used  Substance and Sexual Activity   Alcohol use: Not Currently   Drug use: Never   Sexual activity: Not on file

## 2021-03-05 ENCOUNTER — Telehealth: Payer: Self-pay | Admitting: Pulmonary Disease

## 2021-03-05 NOTE — Telephone Encounter (Signed)
Fax received from Dr. Roda Shutters to perform a left total knee arthroplasty on patient. Will plan to use Spinal anesthesia. Patient needs surgery clearance. Patient was last seen 11/22/20.   Sending to Dr. Judeth Horn for risk assessment or recommendations if patient needs to be seen in office prior to surgical procedure.

## 2021-03-05 NOTE — Telephone Encounter (Signed)
Preoperative evaluation: Pulmonary medicine does not provide clearance but rather a preoperative risk assessment.  Based on patient's individual characteristics, patient is low risk or 1.3% chance of postoperative pulmonary complication based on ARISCAT model.  This takes into account his age, preoperative oxygen saturation, preoperative hemoglobin (notably hemoglobin less than 10 on prior check), recent respiratory infection, location of surgery, and anticipated length of surgery.  No obvious modifiable risk factors prior to surgery.  Please see below for further recommendations. --Agree with spinal anesthesia if this reduces postoperative risk --Provide as needed DuoNebs pre and postoperatively for wheezing or shortness of breath

## 2021-03-05 NOTE — Telephone Encounter (Signed)
Risk assessment provided by Dr. Judeth Horn and last Office notes, printed and faxed to requesting provider.

## 2021-03-13 DIAGNOSIS — Z7984 Long term (current) use of oral hypoglycemic drugs: Secondary | ICD-10-CM | POA: Diagnosis not present

## 2021-03-13 DIAGNOSIS — R351 Nocturia: Secondary | ICD-10-CM | POA: Diagnosis not present

## 2021-03-13 DIAGNOSIS — E119 Type 2 diabetes mellitus without complications: Secondary | ICD-10-CM | POA: Diagnosis not present

## 2021-03-13 DIAGNOSIS — I10 Essential (primary) hypertension: Secondary | ICD-10-CM | POA: Diagnosis not present

## 2021-03-13 DIAGNOSIS — Z5181 Encounter for therapeutic drug level monitoring: Secondary | ICD-10-CM | POA: Diagnosis not present

## 2021-03-13 DIAGNOSIS — J329 Chronic sinusitis, unspecified: Secondary | ICD-10-CM | POA: Diagnosis not present

## 2021-03-13 DIAGNOSIS — Z23 Encounter for immunization: Secondary | ICD-10-CM | POA: Diagnosis not present

## 2021-03-26 ENCOUNTER — Encounter: Payer: Self-pay | Admitting: Orthopaedic Surgery

## 2021-03-26 ENCOUNTER — Other Ambulatory Visit: Payer: Self-pay

## 2021-03-26 ENCOUNTER — Telehealth: Payer: Self-pay | Admitting: Orthopaedic Surgery

## 2021-03-26 ENCOUNTER — Ambulatory Visit (INDEPENDENT_AMBULATORY_CARE_PROVIDER_SITE_OTHER): Payer: No Typology Code available for payment source | Admitting: Orthopaedic Surgery

## 2021-03-26 VITALS — Ht 69.0 in | Wt 200.0 lb

## 2021-03-26 DIAGNOSIS — M1612 Unilateral primary osteoarthritis, left hip: Secondary | ICD-10-CM | POA: Diagnosis not present

## 2021-03-26 NOTE — Progress Notes (Signed)
Office Visit Note   Patient: Antonio Nash           Date of Birth: 1956-03-03           MRN: [id number] Visit Date: 03/26/2021              Requested by: [facility] [address] [place],  [place] [address] PCP: Patient, No Pcp Per (Inactive)   Assessment & Plan: Visit Diagnoses:  1. Primary osteoarthritis of left hip     Plan: Based on findings patient is suffering from chronic advanced left hip DJD.  He has undergone extensive conservative treatments which have failed to provide him with significant relief therefore based on his options he has elected to proceed with left total hip replacement.  Risk benefits rehab recovery alternatives to surgery are reviewed with the patient today.  Wife was present as well.  Questions encouraged and answered.  Patient does have a history of a PE for which she was previously on Eliquis but he has been taken off of this altogether by Dr. Lowella Fairy who is his pulmonologist.  Risk assessment and stratification has been provided by Dr.Hunsicker for planned left total hip replacement.  My plan is to place him on Eliquis for a month postoperatively given history of PE.  Follow-Up Instructions: Return for Postop.   Orders:  No orders of the defined types were placed in this encounter.  No orders of the defined types were placed in this encounter.     Procedures: No procedures performed   Clinical Data: No additional findings.   Subjective: Chief Complaint  Patient presents with   Left Hip - Pain    HPI Antonio Nash is a 65 year old gentleman who is well-known to me comes in for valuation of left hip DJD.  He is a referral from the [place].  He ambulates with a single-point cane.  He takes Advil and Tylenol as needed for the pain.  Has chronic severe pain with almost all ADLs and nighttime pain.  Review of Systems  Constitutional: Negative.   All other systems reviewed and are negative.   Objective: Vital Signs: Ht 5\' 9"   (1.753 m)   Wt 200 lb (90.7 kg)   BMI 29.53 kg/m   Physical Exam Vitals and nursing note reviewed.  Constitutional:      Appearance: He is well-developed.  Pulmonary:     Effort: Pulmonary effort is normal.  Abdominal:     Palpations: Abdomen is soft.  Skin:    General: Skin is warm.  Neurological:     Mental Status: He is alert and oriented to person, place, and time.  Psychiatric:        Behavior: Behavior normal.        Thought Content: Thought content normal.        Judgment: Judgment normal.    Ortho Exam Left hip shows significant discomfort and pain with FADIR and external rotation.  Lateral hip is not significantly tender.  Positive logroll. Specialty Comments:  No specialty comments available.  Imaging: No results found.   PMFS History: Patient Active Problem List   Diagnosis Date Noted   Displaced fracture of lateral malleolus of right fibula, initial encounter for closed fracture 12/21/2020   Lobar pneumonia, unspecified organism (HCC)    DVT (deep venous thrombosis) (HCC) 10/09/2020   Pulmonary emboli (HCC) 10/08/2020   Primary osteoarthritis of left hip 10/02/2020   Discitis of thoracolumbar region 09/19/2020   Medication monitoring encounter 09/19/2020  Osteomyelitis (HCC) 08/31/2020   HTN (hypertension) 08/31/2020   Type 2 diabetes mellitus without complication (HCC) 08/31/2020   Past Medical History:  Diagnosis Date   Diabetes mellitus without complication (HCC)    Hypertension    PE (pulmonary thromboembolism) (HCC) 10/09/2020    History reviewed. No pertinent family history.  Past Surgical History:  Procedure Laterality Date   COLONOSCOPY     HERNIA REPAIR     ROTATOR CUFF REPAIR     Social History   Occupational History   Not on file  Tobacco Use   Smoking status: Never   Smokeless tobacco: Never  Vaping Use   Vaping Use: Never used  Substance and Sexual Activity   Alcohol use: Not Currently   Drug use: Never   Sexual  activity: Not on file

## 2021-03-26 NOTE — Telephone Encounter (Signed)
Pt's wife [name] called requesting a new handicap placard. Pt's handicap sticker expired and would like to pick a new form up. Please call pt's wife about this matter at [phone].

## 2021-03-26 NOTE — Telephone Encounter (Signed)
Yes 5 years

## 2021-03-27 NOTE — Telephone Encounter (Signed)
Handicap form ready for pick up at the front desk. Patient aware.

## 2021-03-29 ENCOUNTER — Ambulatory Visit: Payer: No Typology Code available for payment source | Admitting: Pulmonary Disease

## 2021-04-01 DIAGNOSIS — Z5181 Encounter for therapeutic drug level monitoring: Secondary | ICD-10-CM | POA: Diagnosis not present

## 2021-04-01 DIAGNOSIS — E119 Type 2 diabetes mellitus without complications: Secondary | ICD-10-CM | POA: Diagnosis not present

## 2021-04-01 DIAGNOSIS — Z7984 Long term (current) use of oral hypoglycemic drugs: Secondary | ICD-10-CM | POA: Diagnosis not present

## 2021-04-11 ENCOUNTER — Telehealth: Payer: Self-pay | Admitting: Orthopaedic Surgery

## 2021-04-11 NOTE — Telephone Encounter (Signed)
Forms received from [employer]. To [employer].

## 2021-04-15 ENCOUNTER — Telehealth: Payer: Self-pay | Admitting: Orthopaedic Surgery

## 2021-04-15 NOTE — Telephone Encounter (Signed)
Pt submitted medical release form, short term disability forms, and $25.00 cash payment to [employer]. Accepted 04/15/21

## 2021-04-23 NOTE — Pre-Procedure Instructions (Signed)
Surgical Instructions   Your procedure is scheduled on Monday, September 19th. Report to [facility] Main Entrance "A" at 05:30 A.M., then check in with the Admitting office. Call this number if you have problems the morning of surgery: [phone]   If you have any questions prior to your surgery date call [phone]: Open Monday-Friday 8am-4pm   Remember: Do not eat after midnight the night before your surgery  You may drink clear liquids until 04:30 the morning of your surgery.   Clear liquids allowed are: Water, Non-Citrus Juices (without pulp), Carbonated Beverages, Clear Tea, Black Coffee ONLY (NO MILK, CREAM OR POWDERED CREAMER of any kind), and Gatorade   Patient Instructions  The day of surgery (if you have diabetes): Drink ONE (1) bottled water given to you in your pre admission testing appointment by 04:30 AM the morning of surgery. Drink in one sitting. Do not sip.  This drink was given to you during your hospital  pre-op appointment visit.  Nothing else to drink after completing the  water.         If you have questions, please contact your surgeon's office.    Take these medicines the morning of surgery with A SIP OF WATER  amLODipine (NORVASC) carvedilol (COREG)  If needed: acetaminophen (TYLENOL) diphenhydrAMINE (BENADRYL)  fluticasone (FLONASE)   WHAT DO I DO ABOUT MY DIABETES MEDICATION?   (9/18) THE DAY BEFORE SURGERY: Do not take empagliflozin (JARDIANCE). Take morning dose of glimepiride (AMARYL) .    (9/19) THE MORNING OF SURGERY: Do not take empagliflozin (JARDIANCE). Do not take glimepiride (AMARYL).     HOW TO MANAGE YOUR DIABETES BEFORE AND AFTER SURGERY  Why is it important to control my blood sugar before and after surgery? Improving blood sugar levels before and after surgery helps healing and can limit problems. A way of improving blood sugar control is eating a healthy diet by:  Eating less sugar and carbohydrates  Increasing  activity/exercise  Talking with your doctor about reaching your blood sugar goals High blood sugars (greater than 180 mg/dL) can raise your risk of infections and slow your recovery, so you will need to focus on controlling your diabetes during the weeks before surgery. Make sure that the doctor who takes care of your diabetes knows about your planned surgery including the date and location.  How do I manage my blood sugar before surgery? Check your blood sugar at least 4 times a day, starting 2 days before surgery, to make sure that the level is not too high or low.  Check your blood sugar the morning of your surgery when you wake up and every 2 hours until you get to the Short Stay unit.  If your blood sugar is less than 70 mg/dL, you will need to treat for low blood sugar: Do not take insulin. Treat a low blood sugar (less than 70 mg/dL) with  cup of clear juice (cranberry or apple), 4 glucose tablets, OR glucose gel. Recheck blood sugar in 15 minutes after treatment (to make sure it is greater than 70 mg/dL). If your blood sugar is not greater than 70 mg/dL on recheck, call [phone] for further instructions. Report your blood sugar to the short stay nurse when you get to Short Stay.  If you are admitted to the hospital after surgery: Your blood sugar will be checked by the staff and you will probably be given insulin after surgery (instead of oral diabetes medicines) to make sure you have good blood sugar  levels. The goal for blood sugar control after surgery is 80-180 mg/dL.  As of today, STOP taking any Aspirin (unless otherwise instructed by your surgeon) Aleve, Naproxen, Ibuprofen, Motrin, Advil, Goody's, BC's, all herbal medications, fish oil, and all vitamins.          Do not wear jewelry  Do not wear lotions, powders, colognes, or deodorant. Men may shave face and neck. Do not bring valuables to the hospital. [facility] is not responsible for any belongings or valuables.                Do NOT Smoke (Tobacco/Vaping)  24 hours prior to your procedure If you use a CPAP at night, you may bring all equipment for your overnight stay.   Contacts, glasses, dentures or bridgework may not be worn into surgery, please bring cases for these belongings   For patients admitted to the hospital, discharge time will be determined by your treatment team.   Patients discharged the day of surgery will not be allowed to drive home, and someone needs to stay with them for 24 hours.  ONLY 1 SUPPORT PERSON MAY BE PRESENT WHILE YOU ARE IN SURGERY. IF YOU ARE TO BE ADMITTED ONCE YOU ARE IN YOUR ROOM YOU WILL BE ALLOWED TWO (2) VISITORS.  Minor children may have two parents present. Special consideration for safety and communication needs will be reviewed on a case by case basis.  Special instructions:    Oral Hygiene is also important to reduce your risk of infection.  Remember - BRUSH YOUR TEETH THE MORNING OF SURGERY WITH YOUR REGULAR TOOTHPASTE   [facility]- Preparing For Surgery  Before surgery, you can play an important role. Because skin is not sterile, your skin needs to be as free of germs as possible. You can reduce the number of germs on your skin by washing with CHG (chlorahexidine gluconate) Soap before surgery.  CHG is an antiseptic cleaner which kills germs and bonds with the skin to continue killing germs even after washing.     Please do not use if you have an allergy to CHG or antibacterial soaps. If your skin becomes reddened/irritated stop using the CHG.  Do not shave (including legs and underarms) for at least 48 hours prior to first CHG shower. It is OK to shave your face.  Please follow these instructions carefully.     Shower the NIGHT BEFORE SURGERY and the MORNING OF SURGERY with CHG Soap.   If you chose to wash your hair, wash your hair first as usual with your normal shampoo. After you shampoo, rinse your hair and body thoroughly to remove the shampoo.   Then [employer] and genitals (private parts) with your normal soap and rinse thoroughly to remove soap.  After that Use CHG Soap as you would any other liquid soap. You can apply CHG directly to the skin and wash gently with a scrungie or a clean washcloth.   Apply the CHG Soap to your body ONLY FROM THE NECK DOWN.  Do not use on open wounds or open sores. Avoid contact with your eyes, ears, mouth and genitals (private parts). Wash Face and genitals (private parts)  with your normal soap.   Wash thoroughly, paying special attention to the area where your surgery will be performed.  Thoroughly rinse your body with warm water from the neck down.  DO NOT shower/wash with your normal soap after using and rinsing off the CHG Soap.  Pat yourself dry with  a CLEAN TOWEL.  Wear CLEAN PAJAMAS to bed the night before surgery  Place CLEAN SHEETS on your bed the night before your surgery  DO NOT SLEEP WITH PETS.   Day of Surgery:  Take a shower with CHG soap. Wear Clean/Comfortable clothing the morning of surgery Do not apply any deodorants/lotions.   Remember to brush your teeth WITH YOUR REGULAR TOOTHPASTE.   Please read over the following fact sheets that you were given.

## 2021-04-24 ENCOUNTER — Other Ambulatory Visit: Payer: Self-pay

## 2021-04-24 ENCOUNTER — Telehealth: Payer: Self-pay

## 2021-04-24 ENCOUNTER — Encounter (HOSPITAL_COMMUNITY)
Admission: RE | Admit: 2021-04-24 | Discharge: 2021-04-24 | Disposition: A | Discharge status: Home / Self Care | Payer: No Typology Code available for payment source | Source: Ambulatory Visit | Attending: Orthopaedic Surgery | Admitting: Orthopaedic Surgery

## 2021-04-24 ENCOUNTER — Encounter (HOSPITAL_COMMUNITY): Payer: Self-pay

## 2021-04-24 DIAGNOSIS — Z01818 Encounter for other preprocedural examination: Secondary | ICD-10-CM | POA: Diagnosis not present

## 2021-04-24 LAB — SURGICAL PCR SCREEN
MRSA, PCR: POSITIVE — AB
Staphylococcus aureus: POSITIVE — AB

## 2021-04-24 LAB — PROTIME-INR
INR: 1 (ref 0.8–1.2)
Prothrombin Time: 13.5 seconds (ref 11.4–15.2)

## 2021-04-24 LAB — URINALYSIS, MICROSCOPIC (REFLEX)
RBC / HPF: NONE SEEN RBC/hpf (ref 0–5)
Squamous Epithelial / HPF: NONE SEEN (ref 0–5)

## 2021-04-24 LAB — CBC WITH DIFFERENTIAL/PLATELET
Abs Immature Granulocytes: 0.02 10*3/uL (ref 0.00–0.07)
Basophils Absolute: 0.1 10*3/uL (ref 0.0–0.1)
Basophils Relative: 1 %
Eosinophils Absolute: 0.2 10*3/uL (ref 0.0–0.5)
Eosinophils Relative: 4 %
HCT: 46.3 % (ref 39.0–52.0)
Hemoglobin: 14.6 g/dL (ref 13.0–17.0)
Immature Granulocytes: 0 %
Lymphocytes Relative: 37 %
Lymphs Abs: 2.3 10*3/uL (ref 0.7–4.0)
MCH: 27.9 pg (ref 26.0–34.0)
MCHC: 31.5 g/dL (ref 30.0–36.0)
MCV: 88.5 fL (ref 80.0–100.0)
Monocytes Absolute: 0.7 10*3/uL (ref 0.1–1.0)
Monocytes Relative: 12 %
Neutro Abs: 2.9 10*3/uL (ref 1.7–7.7)
Neutrophils Relative %: 46 %
Platelets: 297 10*3/uL (ref 150–400)
RBC: 5.23 MIL/uL (ref 4.22–5.81)
RDW: 14 % (ref 11.5–15.5)
WBC: 6.2 10*3/uL (ref 4.0–10.5)
nRBC: 0 % (ref 0.0–0.2)

## 2021-04-24 LAB — APTT: aPTT: 28 seconds (ref 24–36)

## 2021-04-24 LAB — URINALYSIS, ROUTINE W REFLEX MICROSCOPIC
Bilirubin Urine: NEGATIVE
Glucose, UA: >=500 mg/dL — AB
Hgb urine dipstick: NEGATIVE
Ketones, ur: NEGATIVE mg/dL
Leukocytes,Ua: NEGATIVE
Nitrite: NEGATIVE
Protein, ur: NEGATIVE mg/dL
Specific Gravity, Urine: 1.01 (ref 1.005–1.030)
pH: 6 (ref 5.0–8.0)

## 2021-04-24 LAB — GLUCOSE, CAPILLARY: Glucose-Capillary: 167 mg/dL — ABNORMAL HIGH (ref 70–99)

## 2021-04-24 LAB — COMPREHENSIVE METABOLIC PANEL
ALT: 20 U/L (ref 0–44)
AST: 20 U/L (ref 15–41)
Albumin: 3.9 g/dL (ref 3.5–5.0)
Alkaline Phosphatase: 50 U/L (ref 38–126)
Anion gap: 6 (ref 5–15)
BUN: 15 mg/dL (ref 8–23)
CO2: 26 mmol/L (ref 22–32)
Calcium: 9.3 mg/dL (ref 8.9–10.3)
Chloride: 105 mmol/L (ref 98–111)
Creatinine, Ser: 0.98 mg/dL (ref 0.61–1.24)
GFR, Estimated: >60 mL/min (ref >60)
Glucose, Bld: 131 mg/dL — ABNORMAL HIGH (ref 70–99)
Potassium: 4.3 mmol/L (ref 3.5–5.1)
Sodium: 137 mmol/L (ref 135–145)
Total Bilirubin: 0.3 mg/dL (ref 0.3–1.2)
Total Protein: 7.1 g/dL (ref 6.5–8.1)

## 2021-04-24 LAB — HEMOGLOBIN A1C
Hgb A1c MFr Bld: 6.8 % — ABNORMAL HIGH (ref 4.8–5.6)
Mean Plasma Glucose: 148.46 mg/dL

## 2021-04-24 NOTE — Progress Notes (Addendum)
PCP - [facility] Cardiologist - Denies  Chest x-ray - Not indicated EKG - 04/24/21 Stress Test - 18 years ago at [facility], no f/u needed ECHO - 10/09/20 Cardiac Cath - Denies  Sleep Study - Denies OSA  DM - Type II Fasting Blood Sugar - CBG this am was 162 at home. CBG at PAT appt 167, after having  a granola bar.  Checks Blood Sugar once a day  ERAS Protcol - Yes PRE-SURGERY H2O   COVID TEST- Friday the 16th of September @ 9am   Anesthesia review: No  Patient denies shortness of breath, fever, cough and chest pain at PAT appointment   All instructions explained to the patient, with a verbal understanding of the material. Patient agrees to go over the instructions while at home for a better understanding. Patient also instructed to wear a mask while in public after being tested for COVID-19. The opportunity to ask questions was provided.

## 2021-04-24 NOTE — Progress Notes (Signed)
Do they automatically send in mupirocin ointment when they're MRSA positive?

## 2021-04-24 NOTE — Telephone Encounter (Signed)
Ginger with Pre-Admission testing at [facility] wanted to let you know that patient's Surgical PCR came back abnormal.  Positive for Staph and MRSA.   CB# [phone].

## 2021-04-24 NOTE — Progress Notes (Signed)
Received call from lab that surgical PCR was positive for staph and MRSA.  Sent staff message to Dr. Roda Shutters and called Dr. Warren Danes office spoke to April to make them aware of the result as well.

## 2021-04-24 NOTE — Telephone Encounter (Signed)
Debbie, can we make him any case but first case since he's positive for MRSA on PCR.  Thanks.

## 2021-04-25 NOTE — Progress Notes (Signed)
From what I understand they just swab their nose day of surgery

## 2021-04-25 NOTE — Progress Notes (Signed)
Debbie, can we move this patient so that he is not the first case?  He is mrsa positive and will need vanc

## 2021-05-02 ENCOUNTER — Other Ambulatory Visit (HOSPITAL_COMMUNITY)
Admission: RE | Admit: 2021-05-02 | Discharge: 2021-05-02 | Disposition: A | Discharge status: Home / Self Care | Payer: No Typology Code available for payment source | Source: Ambulatory Visit | Attending: Orthopaedic Surgery | Admitting: Orthopaedic Surgery

## 2021-05-02 ENCOUNTER — Other Ambulatory Visit: Payer: Self-pay | Admitting: Physician Assistant

## 2021-05-02 DIAGNOSIS — Z01812 Encounter for preprocedural laboratory examination: Secondary | ICD-10-CM | POA: Insufficient documentation

## 2021-05-02 DIAGNOSIS — Z20822 Contact with and (suspected) exposure to covid-19: Secondary | ICD-10-CM | POA: Insufficient documentation

## 2021-05-02 DIAGNOSIS — Z7984 Long term (current) use of oral hypoglycemic drugs: Secondary | ICD-10-CM | POA: Diagnosis not present

## 2021-05-02 DIAGNOSIS — Z5181 Encounter for therapeutic drug level monitoring: Secondary | ICD-10-CM | POA: Diagnosis not present

## 2021-05-02 DIAGNOSIS — E119 Type 2 diabetes mellitus without complications: Secondary | ICD-10-CM | POA: Diagnosis not present

## 2021-05-02 LAB — SARS CORONAVIRUS 2 (TAT 6-24 HRS): SARS Coronavirus 2: NEGATIVE

## 2021-05-02 MED ORDER — DOCUSATE SODIUM 100 MG PO CAPS: 100.0000 mg | ORAL_CAPSULE | Freq: Every day | ORAL | 2 refills | Status: AC | PRN

## 2021-05-02 MED ORDER — OXYCODONE-ACETAMINOPHEN 5-325 MG PO TABS: 1.0000 | ORAL_TABLET | Freq: Four times a day (QID) | ORAL | 0 refills | Status: DC | PRN

## 2021-05-02 MED ORDER — SULFAMETHOXAZOLE-TRIMETHOPRIM 800-160 MG PO TABS: 1.0000 | ORAL_TABLET | Freq: Two times a day (BID) | ORAL | 0 refills | Status: DC

## 2021-05-02 MED ORDER — METHOCARBAMOL 500 MG PO TABS: 500.0000 mg | ORAL_TABLET | Freq: Two times a day (BID) | ORAL | 2 refills | Status: DC | PRN

## 2021-05-02 MED ORDER — ONDANSETRON HCL 4 MG PO TABS: 4.0000 mg | ORAL_TABLET | Freq: Three times a day (TID) | ORAL | 0 refills | Status: DC | PRN

## 2021-05-02 MED ORDER — RIVAROXABAN 10 MG PO TABS: 10.0000 mg | ORAL_TABLET | Freq: Every day | ORAL | 0 refills | Status: DC

## 2021-05-02 MED ORDER — CEPHALEXIN 500 MG PO CAPS: 500.0000 mg | ORAL_CAPSULE | Freq: Four times a day (QID) | ORAL | 0 refills | Status: DC

## 2021-05-03 ENCOUNTER — Inpatient Hospital Stay (HOSPITAL_COMMUNITY): Admission: RE | Admit: 2021-05-03 | Payer: No Typology Code available for payment source | Source: Ambulatory Visit

## 2021-05-03 MED ORDER — TRANEXAMIC ACID 1000 MG/10ML IV SOLN
2000.0000 mg | INTRAVENOUS | Status: DC
  Filled 2021-05-03: qty 20

## 2021-05-06 ENCOUNTER — Observation Stay (HOSPITAL_COMMUNITY)
Admission: RE | Admit: 2021-05-06 | Discharge: 2021-05-07 | Disposition: A | Discharge status: Home / Self Care | Payer: No Typology Code available for payment source | Source: Ambulatory Visit | Attending: Orthopaedic Surgery | Admitting: Orthopaedic Surgery

## 2021-05-06 ENCOUNTER — Ambulatory Visit (HOSPITAL_COMMUNITY): Payer: No Typology Code available for payment source | Admitting: Anesthesiology

## 2021-05-06 ENCOUNTER — Encounter (HOSPITAL_COMMUNITY): Payer: Self-pay | Admitting: Orthopaedic Surgery

## 2021-05-06 ENCOUNTER — Other Ambulatory Visit: Payer: Self-pay

## 2021-05-06 ENCOUNTER — Encounter (HOSPITAL_COMMUNITY)
Admission: RE | Disposition: A | Discharge status: Home / Self Care | Payer: Self-pay | Source: Ambulatory Visit | Attending: Orthopaedic Surgery

## 2021-05-06 ENCOUNTER — Ambulatory Visit (HOSPITAL_COMMUNITY): Payer: No Typology Code available for payment source

## 2021-05-06 ENCOUNTER — Observation Stay (HOSPITAL_COMMUNITY): Payer: No Typology Code available for payment source

## 2021-05-06 DIAGNOSIS — Z471 Aftercare following joint replacement surgery: Secondary | ICD-10-CM | POA: Diagnosis not present

## 2021-05-06 DIAGNOSIS — Z79899 Other long term (current) drug therapy: Secondary | ICD-10-CM | POA: Diagnosis not present

## 2021-05-06 DIAGNOSIS — Z7984 Long term (current) use of oral hypoglycemic drugs: Secondary | ICD-10-CM | POA: Diagnosis not present

## 2021-05-06 DIAGNOSIS — M1611 Unilateral primary osteoarthritis, right hip: Secondary | ICD-10-CM | POA: Diagnosis not present

## 2021-05-06 DIAGNOSIS — Z96642 Presence of left artificial hip joint: Secondary | ICD-10-CM | POA: Diagnosis not present

## 2021-05-06 DIAGNOSIS — I10 Essential (primary) hypertension: Secondary | ICD-10-CM | POA: Diagnosis not present

## 2021-05-06 DIAGNOSIS — M1612 Unilateral primary osteoarthritis, left hip: Principal | ICD-10-CM | POA: Diagnosis present

## 2021-05-06 DIAGNOSIS — E119 Type 2 diabetes mellitus without complications: Secondary | ICD-10-CM | POA: Insufficient documentation

## 2021-05-06 DIAGNOSIS — Z96649 Presence of unspecified artificial hip joint: Secondary | ICD-10-CM

## 2021-05-06 DIAGNOSIS — Z419 Encounter for procedure for purposes other than remedying health state, unspecified: Secondary | ICD-10-CM

## 2021-05-06 HISTORY — PX: TOTAL HIP ARTHROPLASTY: SHX124

## 2021-05-06 LAB — GLUCOSE, CAPILLARY
Glucose-Capillary: 171 mg/dL — ABNORMAL HIGH (ref 70–99)
Glucose-Capillary: 175 mg/dL — ABNORMAL HIGH (ref 70–99)
Glucose-Capillary: 212 mg/dL — ABNORMAL HIGH (ref 70–99)
Glucose-Capillary: 158 mg/dL — ABNORMAL HIGH (ref 70–99)

## 2021-05-06 SURGERY — ARTHROPLASTY, HIP, TOTAL, ANTERIOR APPROACH
Anesthesia: Monitor Anesthesia Care | Site: Hip | Laterality: Left

## 2021-05-06 MED ORDER — LIDOCAINE 2% (20 MG/ML) 5 ML SYRINGE
INTRAMUSCULAR | Status: DC | PRN
  Administered 2021-05-06: 60 mg via INTRAVENOUS

## 2021-05-06 MED ORDER — ONDANSETRON HCL 4 MG/2ML IJ SOLN: 4.0000 mg | Freq: Four times a day (QID) | INTRAMUSCULAR | Status: DC | PRN

## 2021-05-06 MED ORDER — DEXAMETHASONE SODIUM PHOSPHATE 10 MG/ML IJ SOLN
INTRAMUSCULAR | Status: DC | PRN
  Administered 2021-05-06: 10 mg via INTRAVENOUS

## 2021-05-06 MED ORDER — DIPHENHYDRAMINE HCL 12.5 MG/5ML PO ELIX
25.0000 mg | ORAL_SOLUTION | ORAL | Status: DC | PRN
  Filled 2021-05-06: qty 10

## 2021-05-06 MED ORDER — DOCUSATE SODIUM 100 MG PO CAPS
100.0000 mg | ORAL_CAPSULE | Freq: Two times a day (BID) | ORAL | Status: DC
  Administered 2021-05-06 – 2021-05-07 (×2): 100 mg via ORAL
  Filled 2021-05-06 (×3): qty 1

## 2021-05-06 MED ORDER — METOCLOPRAMIDE HCL 5 MG PO TABS: 5.0000 mg | ORAL_TABLET | Freq: Three times a day (TID) | ORAL | Status: DC | PRN

## 2021-05-06 MED ORDER — CHLORHEXIDINE GLUCONATE 0.12 % MT SOLN
15.0000 mL | Freq: Once | OROMUCOSAL | Status: AC
  Administered 2021-05-06: 15 mL via OROMUCOSAL
  Filled 2021-05-06: qty 15

## 2021-05-06 MED ORDER — MENTHOL 3 MG MT LOZG: 1.0000 | LOZENGE | OROMUCOSAL | Status: DC | PRN

## 2021-05-06 MED ORDER — ROCURONIUM BROMIDE 10 MG/ML (PF) SYRINGE
PREFILLED_SYRINGE | INTRAVENOUS | Status: DC | PRN
  Administered 2021-05-06: 50 mg via INTRAVENOUS

## 2021-05-06 MED ORDER — FENTANYL CITRATE (PF) 100 MCG/2ML IJ SOLN
INTRAMUSCULAR | Status: AC
  Filled 2021-05-06: qty 2

## 2021-05-06 MED ORDER — HYDROMORPHONE HCL 1 MG/ML IJ SOLN: 0.5000 mg | INTRAMUSCULAR | Status: DC | PRN

## 2021-05-06 MED ORDER — ORAL CARE MOUTH RINSE: 15.0000 mL | Freq: Once | OROMUCOSAL | Status: AC

## 2021-05-06 MED ORDER — TRANEXAMIC ACID-NACL 1000-0.7 MG/100ML-% IV SOLN
1000.0000 mg | Freq: Once | INTRAVENOUS | Status: AC
  Administered 2021-05-06: 1000 mg via INTRAVENOUS
  Filled 2021-05-06: qty 100

## 2021-05-06 MED ORDER — TRANEXAMIC ACID-NACL 1000-0.7 MG/100ML-% IV SOLN
INTRAVENOUS | Status: AC
  Filled 2021-05-06: qty 100

## 2021-05-06 MED ORDER — FENTANYL CITRATE (PF) 250 MCG/5ML IJ SOLN
INTRAMUSCULAR | Status: AC
  Filled 2021-05-06: qty 5

## 2021-05-06 MED ORDER — 0.9 % SODIUM CHLORIDE (POUR BTL) OPTIME
TOPICAL | Status: DC | PRN
  Administered 2021-05-06: 1000 mL

## 2021-05-06 MED ORDER — METHOCARBAMOL 500 MG PO TABS
500.0000 mg | ORAL_TABLET | Freq: Four times a day (QID) | ORAL | Status: DC | PRN
  Administered 2021-05-06 – 2021-05-07 (×3): 500 mg via ORAL
  Filled 2021-05-06 (×3): qty 1

## 2021-05-06 MED ORDER — METOCLOPRAMIDE HCL 5 MG/ML IJ SOLN: 5.0000 mg | Freq: Three times a day (TID) | INTRAMUSCULAR | Status: DC | PRN

## 2021-05-06 MED ORDER — LACTATED RINGERS IV SOLN: INTRAVENOUS | Status: DC

## 2021-05-06 MED ORDER — PROPOFOL 10 MG/ML IV BOLUS
INTRAVENOUS | Status: DC | PRN
  Administered 2021-05-06: 150 mg via INTRAVENOUS

## 2021-05-06 MED ORDER — POLYETHYLENE GLYCOL 3350 17 G PO PACK
17.0000 g | PACK | Freq: Every day | ORAL | Status: DC
  Administered 2021-05-07: 17 g via ORAL
  Filled 2021-05-06: qty 1

## 2021-05-06 MED ORDER — ONDANSETRON HCL 4 MG PO TABS: 4.0000 mg | ORAL_TABLET | Freq: Four times a day (QID) | ORAL | Status: DC | PRN

## 2021-05-06 MED ORDER — MIDAZOLAM HCL 2 MG/2ML IJ SOLN
INTRAMUSCULAR | Status: AC
  Filled 2021-05-06: qty 2

## 2021-05-06 MED ORDER — ACETAMINOPHEN 325 MG PO TABS: 325.0000 mg | ORAL_TABLET | ORAL | Status: DC | PRN

## 2021-05-06 MED ORDER — MIDAZOLAM HCL 5 MG/5ML IJ SOLN
INTRAMUSCULAR | Status: DC | PRN
  Administered 2021-05-06: 2 mg via INTRAVENOUS

## 2021-05-06 MED ORDER — CARVEDILOL 12.5 MG PO TABS
12.5000 mg | ORAL_TABLET | Freq: Every day | ORAL | Status: DC
  Administered 2021-05-07: 12.5 mg via ORAL
  Filled 2021-05-06: qty 1

## 2021-05-06 MED ORDER — DEXAMETHASONE SODIUM PHOSPHATE 10 MG/ML IJ SOLN
10.0000 mg | Freq: Once | INTRAMUSCULAR | Status: AC
  Administered 2021-05-07: 10 mg via INTRAVENOUS
  Filled 2021-05-06: qty 1

## 2021-05-06 MED ORDER — PANTOPRAZOLE SODIUM 40 MG PO TBEC
40.0000 mg | DELAYED_RELEASE_TABLET | Freq: Every day | ORAL | Status: DC
  Administered 2021-05-06 – 2021-05-07 (×2): 40 mg via ORAL
  Filled 2021-05-06 (×2): qty 1

## 2021-05-06 MED ORDER — INSULIN ASPART 100 UNIT/ML IJ SOLN: 0.0000 [IU] | Freq: Every day | INTRAMUSCULAR | Status: DC

## 2021-05-06 MED ORDER — METHOCARBAMOL 1000 MG/10ML IJ SOLN
500.0000 mg | Freq: Four times a day (QID) | INTRAVENOUS | Status: DC | PRN
  Filled 2021-05-06: qty 5

## 2021-05-06 MED ORDER — INSULIN ASPART 100 UNIT/ML IJ SOLN
0.0000 [IU] | Freq: Three times a day (TID) | INTRAMUSCULAR | Status: DC
  Administered 2021-05-06: 5 [IU] via SUBCUTANEOUS
  Administered 2021-05-07: 3 [IU] via SUBCUTANEOUS

## 2021-05-06 MED ORDER — TRANEXAMIC ACID 1000 MG/10ML IV SOLN
INTRAVENOUS | Status: DC | PRN
  Administered 2021-05-06: 2000 mg via TOPICAL

## 2021-05-06 MED ORDER — POVIDONE-IODINE 10 % EX SWAB
2.0000 "application " | Freq: Once | CUTANEOUS | Status: AC
  Administered 2021-05-06: 2 via TOPICAL

## 2021-05-06 MED ORDER — CEFAZOLIN SODIUM-DEXTROSE 2-4 GM/100ML-% IV SOLN
2.0000 g | INTRAVENOUS | Status: AC
  Administered 2021-05-06: 2 g via INTRAVENOUS
  Filled 2021-05-06: qty 100

## 2021-05-06 MED ORDER — GLIMEPIRIDE 2 MG PO TABS
4.0000 mg | ORAL_TABLET | Freq: Every day | ORAL | Status: DC
  Administered 2021-05-07: 4 mg via ORAL
  Filled 2021-05-06: qty 2

## 2021-05-06 MED ORDER — SODIUM CHLORIDE 0.9 % IR SOLN
Status: DC | PRN
  Administered 2021-05-06: 1000 mL

## 2021-05-06 MED ORDER — LISINOPRIL 20 MG PO TABS
40.0000 mg | ORAL_TABLET | Freq: Every day | ORAL | Status: DC
  Administered 2021-05-06 – 2021-05-07 (×2): 40 mg via ORAL
  Filled 2021-05-06 (×2): qty 2

## 2021-05-06 MED ORDER — VANCOMYCIN HCL IN DEXTROSE 1-5 GM/200ML-% IV SOLN
1000.0000 mg | Freq: Once | INTRAVENOUS | Status: AC
  Administered 2021-05-06: 1000 mg via INTRAVENOUS
  Filled 2021-05-06 (×2): qty 200

## 2021-05-06 MED ORDER — CEFAZOLIN SODIUM-DEXTROSE 2-4 GM/100ML-% IV SOLN
2.0000 g | Freq: Four times a day (QID) | INTRAVENOUS | Status: AC
  Administered 2021-05-06 (×2): 2 g via INTRAVENOUS
  Filled 2021-05-06 (×2): qty 100

## 2021-05-06 MED ORDER — IRRISEPT - 450ML BOTTLE WITH 0.05% CHG IN STERILE WATER, USP 99.95% OPTIME
TOPICAL | Status: DC | PRN
  Administered 2021-05-06: 450 mL via TOPICAL

## 2021-05-06 MED ORDER — ACETAMINOPHEN 160 MG/5ML PO SOLN: 325.0000 mg | ORAL | Status: DC | PRN

## 2021-05-06 MED ORDER — ALUM & MAG HYDROXIDE-SIMETH 200-200-20 MG/5ML PO SUSP: 30.0000 mL | ORAL | Status: DC | PRN

## 2021-05-06 MED ORDER — ACETAMINOPHEN 500 MG PO TABS
1000.0000 mg | ORAL_TABLET | Freq: Four times a day (QID) | ORAL | Status: AC
Start: 2021-05-06 — End: 2021-05-07
  Administered 2021-05-06 (×3): 1000 mg via ORAL
  Filled 2021-05-06 (×4): qty 2

## 2021-05-06 MED ORDER — OXYCODONE HCL 5 MG PO TABS: 5.0000 mg | ORAL_TABLET | Freq: Once | ORAL | Status: DC | PRN

## 2021-05-06 MED ORDER — FENTANYL CITRATE (PF) 100 MCG/2ML IJ SOLN
25.0000 ug | INTRAMUSCULAR | Status: DC | PRN
  Administered 2021-05-06 (×2): 50 ug via INTRAVENOUS

## 2021-05-06 MED ORDER — SUGAMMADEX SODIUM 200 MG/2ML IV SOLN
INTRAVENOUS | Status: DC | PRN
  Administered 2021-05-06: 200 mg via INTRAVENOUS

## 2021-05-06 MED ORDER — ONDANSETRON HCL 4 MG/2ML IJ SOLN
INTRAMUSCULAR | Status: AC
  Filled 2021-05-06: qty 2

## 2021-05-06 MED ORDER — VANCOMYCIN HCL 1000 MG IV SOLR
INTRAVENOUS | Status: AC
  Filled 2021-05-06: qty 20

## 2021-05-06 MED ORDER — ACETAMINOPHEN 325 MG PO TABS: 325.0000 mg | ORAL_TABLET | Freq: Four times a day (QID) | ORAL | Status: DC | PRN

## 2021-05-06 MED ORDER — TAMSULOSIN HCL 0.4 MG PO CAPS
0.4000 mg | ORAL_CAPSULE | Freq: Every day | ORAL | Status: DC
  Administered 2021-05-06: 0.4 mg via ORAL
  Filled 2021-05-06: qty 1

## 2021-05-06 MED ORDER — OXYCODONE HCL 5 MG/5ML PO SOLN
5.0000 mg | Freq: Once | ORAL | Status: DC | PRN
Start: 2021-05-06 — End: 2021-05-06

## 2021-05-06 MED ORDER — MEPERIDINE HCL 25 MG/ML IJ SOLN: 6.2500 mg | INTRAMUSCULAR | Status: DC | PRN

## 2021-05-06 MED ORDER — ONDANSETRON HCL 4 MG/2ML IJ SOLN
INTRAMUSCULAR | Status: DC | PRN
  Administered 2021-05-06: 4 mg via INTRAVENOUS

## 2021-05-06 MED ORDER — BUPIVACAINE-MELOXICAM ER 400-12 MG/14ML IJ SOLN
INTRAMUSCULAR | Status: AC
  Filled 2021-05-06: qty 1

## 2021-05-06 MED ORDER — PROPOFOL 10 MG/ML IV BOLUS
INTRAVENOUS | Status: AC
  Filled 2021-05-06: qty 20

## 2021-05-06 MED ORDER — PHENOL 1.4 % MT LIQD: 1.0000 | OROMUCOSAL | Status: DC | PRN

## 2021-05-06 MED ORDER — ONDANSETRON HCL 4 MG/2ML IJ SOLN: 4.0000 mg | Freq: Once | INTRAMUSCULAR | Status: DC | PRN

## 2021-05-06 MED ORDER — SORBITOL 70 % SOLN: 30.0000 mL | Freq: Every day | Status: DC | PRN

## 2021-05-06 MED ORDER — DEXAMETHASONE SODIUM PHOSPHATE 10 MG/ML IJ SOLN
INTRAMUSCULAR | Status: AC
  Filled 2021-05-06: qty 1

## 2021-05-06 MED ORDER — TRANEXAMIC ACID-NACL 1000-0.7 MG/100ML-% IV SOLN
1000.0000 mg | INTRAVENOUS | Status: AC
  Administered 2021-05-06: 1000 mg via INTRAVENOUS
  Filled 2021-05-06: qty 100

## 2021-05-06 MED ORDER — SODIUM CHLORIDE 0.9 % IV SOLN: INTRAVENOUS | Status: DC

## 2021-05-06 MED ORDER — OXYCODONE HCL 5 MG PO TABS: 10.0000 mg | ORAL_TABLET | ORAL | Status: DC | PRN

## 2021-05-06 MED ORDER — OXYCODONE HCL 5 MG PO TABS
5.0000 mg | ORAL_TABLET | ORAL | Status: DC | PRN
  Administered 2021-05-06 – 2021-05-07 (×3): 10 mg via ORAL
  Filled 2021-05-06 (×3): qty 2

## 2021-05-06 MED ORDER — EPHEDRINE SULFATE-NACL 50-0.9 MG/10ML-% IV SOSY
PREFILLED_SYRINGE | INTRAVENOUS | Status: DC | PRN
  Administered 2021-05-06 (×2): 10 mg via INTRAVENOUS
  Administered 2021-05-06: 5 mg via INTRAVENOUS

## 2021-05-06 MED ORDER — FENTANYL CITRATE (PF) 100 MCG/2ML IJ SOLN
INTRAMUSCULAR | Status: DC | PRN
  Administered 2021-05-06 (×7): 50 ug via INTRAVENOUS

## 2021-05-06 MED ORDER — RIVAROXABAN 10 MG PO TABS
10.0000 mg | ORAL_TABLET | Freq: Every day | ORAL | Status: DC
  Administered 2021-05-07: 10 mg via ORAL
  Filled 2021-05-06: qty 1

## 2021-05-06 MED ORDER — OXYCODONE HCL ER 10 MG PO T12A
10.0000 mg | EXTENDED_RELEASE_TABLET | Freq: Two times a day (BID) | ORAL | Status: DC
Start: 2021-05-06 — End: 2021-05-07
  Administered 2021-05-06 – 2021-05-07 (×3): 10 mg via ORAL
  Filled 2021-05-06 (×3): qty 1

## 2021-05-06 MED ORDER — VANCOMYCIN HCL 1 G IV SOLR
INTRAVENOUS | Status: DC | PRN
  Administered 2021-05-06: 1000 mg via TOPICAL

## 2021-05-06 MED ORDER — EMPAGLIFLOZIN 25 MG PO TABS
25.0000 mg | ORAL_TABLET | Freq: Every day | ORAL | Status: DC
  Administered 2021-05-06 – 2021-05-07 (×2): 25 mg via ORAL
  Filled 2021-05-06 (×2): qty 1

## 2021-05-06 MED ORDER — BUPIVACAINE-MELOXICAM ER 400-12 MG/14ML IJ SOLN
INTRAMUSCULAR | Status: DC | PRN
  Administered 2021-05-06: 400 mg

## 2021-05-06 SURGICAL SUPPLY — 70 items
ACETAB CUP W/GRIPTION 54 (Plate) ×2 IMPLANT
ADH SKN CLS APL DERMABOND .7 (GAUZE/BANDAGES/DRESSINGS) ×1
BAG COUNTER SPONGE SURGICOUNT (BAG) ×2 IMPLANT
BAG DECANTER FOR FLEXI CONT (MISCELLANEOUS) ×2 IMPLANT
BAG SPNG CNTER NS LX DISP (BAG) ×1
CELLS DAT CNTRL 66122 CELL SVR (MISCELLANEOUS) IMPLANT
COOLER ICEMAN CLASSIC (MISCELLANEOUS) ×2 IMPLANT
COVER PERINEAL POST (MISCELLANEOUS) ×2 IMPLANT
COVER SURGICAL LIGHT HANDLE (MISCELLANEOUS) ×2 IMPLANT
CUP ACETAB W/GRIPTION 54 (Plate) ×1 IMPLANT
DERMABOND ADVANCED (GAUZE/BANDAGES/DRESSINGS) ×1
DERMABOND ADVANCED .7 DNX12 (GAUZE/BANDAGES/DRESSINGS) IMPLANT
DRAPE C-ARM 42X72 X-RAY (DRAPES) ×2 IMPLANT
DRAPE POUCH INSTRU U-SHP 10X18 (DRAPES) ×2 IMPLANT
DRAPE STERI IOBAN 125X83 (DRAPES) ×2 IMPLANT
DRAPE U-SHAPE 47X51 STRL (DRAPES) ×4 IMPLANT
DRSG AQUACEL AG ADV 3.5X10 (GAUZE/BANDAGES/DRESSINGS) ×1 IMPLANT
DURAPREP 26ML APPLICATOR (WOUND CARE) ×4 IMPLANT
ELECT BLADE 4.0 EZ CLEAN MEGAD (MISCELLANEOUS) ×2
ELECT REM PT RETURN 9FT ADLT (ELECTROSURGICAL) ×2
ELECTRODE BLDE 4.0 EZ CLN MEGD (MISCELLANEOUS) ×1 IMPLANT
ELECTRODE REM PT RTRN 9FT ADLT (ELECTROSURGICAL) ×1 IMPLANT
GLOVE SURG LTX SZ7 (GLOVE) ×4 IMPLANT
GLOVE SURG NEOP MICRO LF SZ7.5 (GLOVE) ×2 IMPLANT
GLOVE SURG SYN 7.5  E (GLOVE) ×8
GLOVE SURG SYN 7.5 E (GLOVE) ×4 IMPLANT
GLOVE SURG SYN 7.5 PF PI (GLOVE) ×4 IMPLANT
GLOVE SURG UNDER POLY LF SZ7 (GLOVE) ×10 IMPLANT
GOWN STRL REIN XL XLG (GOWN DISPOSABLE) ×2 IMPLANT
GOWN STRL REUS W/ TWL LRG LVL3 (GOWN DISPOSABLE) IMPLANT
GOWN STRL REUS W/ TWL XL LVL3 (GOWN DISPOSABLE) ×1 IMPLANT
GOWN STRL REUS W/TWL LRG LVL3 (GOWN DISPOSABLE)
GOWN STRL REUS W/TWL XL LVL3 (GOWN DISPOSABLE) ×2
HANDPIECE INTERPULSE COAX TIP (DISPOSABLE) ×2
HEAD CERAMIC DELTA 36 PLUS 1.5 (Hips) ×2 IMPLANT
HOOD PEEL AWAY FLYTE STAYCOOL (MISCELLANEOUS) ×4 IMPLANT
IV NS 1000ML (IV SOLUTION) ×2
IV NS 1000ML BAXH (IV SOLUTION) IMPLANT
IV NS IRRIG 3000ML ARTHROMATIC (IV SOLUTION) IMPLANT
JET LAVAGE IRRISEPT WOUND (IRRIGATION / IRRIGATOR) ×2
KIT BASIN OR (CUSTOM PROCEDURE TRAY) ×2 IMPLANT
LAVAGE JET IRRISEPT WOUND (IRRIGATION / IRRIGATOR) ×1 IMPLANT
LINER NEUTRAL 54X36MM PLUS 4 (Hips) ×2 IMPLANT
MARKER SKIN DUAL TIP RULER LAB (MISCELLANEOUS) ×2 IMPLANT
NDL SPNL 18GX3.5 QUINCKE PK (NEEDLE) ×1 IMPLANT
NEEDLE SPNL 18GX3.5 QUINCKE PK (NEEDLE) ×2 IMPLANT
PACK TOTAL JOINT (CUSTOM PROCEDURE TRAY) ×2 IMPLANT
PACK UNIVERSAL I (CUSTOM PROCEDURE TRAY) ×2 IMPLANT
PAD COLD SHLDR WRAP-ON (PAD) ×2 IMPLANT
RETRACTOR WND ALEXIS 18 MED (MISCELLANEOUS) IMPLANT
RTRCTR WOUND ALEXIS 18CM MED (MISCELLANEOUS)
SAW OSC TIP CART 19.5X105X1.3 (SAW) ×2 IMPLANT
SCREW 6.5MMX25MM (Screw) ×1 IMPLANT
SET HNDPC FAN SPRY TIP SCT (DISPOSABLE) ×1 IMPLANT
STAPLER VISISTAT 35W (STAPLE) IMPLANT
STEM FEMORAL SZ6 HIGH ACTIS (Stem) ×2 IMPLANT
SUT ETHIBOND 2 V 37 (SUTURE) ×2 IMPLANT
SUT ETHILON 2 0 FS 18 (SUTURE) ×4 IMPLANT
SUT VIC AB 0 CT1 27 (SUTURE) ×2
SUT VIC AB 0 CT1 27XBRD ANBCTR (SUTURE) ×1 IMPLANT
SUT VIC AB 1 CTX 36 (SUTURE) ×2
SUT VIC AB 1 CTX36XBRD ANBCTR (SUTURE) ×1 IMPLANT
SUT VIC AB 2-0 CT1 27 (SUTURE) ×4
SUT VIC AB 2-0 CT1 TAPERPNT 27 (SUTURE) ×2 IMPLANT
SYR 50ML LL SCALE MARK (SYRINGE) ×2 IMPLANT
TOWEL GREEN STERILE (TOWEL DISPOSABLE) ×2 IMPLANT
TRAY CATH 16FR W/PLASTIC CATH (SET/KITS/TRAYS/PACK) IMPLANT
TRAY FOLEY W/BAG SLVR 16FR (SET/KITS/TRAYS/PACK) ×2
TRAY FOLEY W/BAG SLVR 16FR ST (SET/KITS/TRAYS/PACK) ×1 IMPLANT
YANKAUER SUCT BULB TIP NO VENT (SUCTIONS) ×2 IMPLANT

## 2021-05-06 NOTE — Discharge Instructions (Addendum)

## 2021-05-06 NOTE — Op Note (Signed)
LEFT TOTAL HIP ARTHROPLASTY ANTERIOR APPROACH  Procedure Note [facility]BERS IYENGAR   [id number]  Pre-op Diagnosis: left hip degenerative joint disease     Post-op Diagnosis: same   Operative Procedures  1. Total hip replacement; Left hip; uncemented [id number]   Surgeon: Gershon Mussel, M.D.  Assist: Oneal Grout, PA-C   Anesthesia: general, local  Prosthesis: Depuy Acetabulum: Pinnacle 54 mm Femur: Actis 6 HO Head: 36 mm size: +1.5 Liner: +4 Bearing Type: ceramic/poly  Total Hip Arthroplasty (Anterior Approach) Op Note:  After informed consent was obtained and the operative extremity marked in the holding area, the patient was brought back to the operating room and placed supine on the HANA table. Next, the operative extremity was prepped and draped in normal sterile fashion. Surgical timeout occurred verifying patient identification, surgical site, surgical procedure and administration of antibiotics.  A modified anterior Smith-Peterson approach to the hip was performed, using the interval between tensor fascia lata and sartorius.  Dissection was carried bluntly down onto the anterior hip capsule. The lateral femoral circumflex vessels were identified and coagulated. A capsulotomy was performed and the capsular flaps tagged for later repair.  The neck osteotomy was performed. The femoral head was removed which showed severe wear, the acetabular rim was cleared of soft tissue and attention was turned to reaming the acetabulum.  Sequential reaming was performed under fluoroscopic guidance. We reamed to a size 53 mm, and then impacted the acetabular shell. A 25 mm cancellous screw was placed through the shell for added fixation.  The liner was then placed after irrigation and attention turned to the femur.  After placing the femoral hook, the leg was taken to externally rotated, extended and adducted position taking care to perform soft tissue releases to allow for adequate mobilization  of the femur. Soft tissue was cleared from the shoulder of the greater trochanter and the hook elevator used to improve exposure of the proximal femur. Overall, soft tissue tension was fairly tight.  Sequential broaching performed up to a size 6. Trial neck and head were placed. The leg was brought back up to neutral and the construct reduced.  Antibiotic irrigation was placed in the surgical wound and kept for at least 1 minute.  The position and sizing of components, offset and leg lengths were checked using fluoroscopy. Stability of the construct was checked in extension and external rotation without any subluxation or impingement of prosthesis. We dislocated the prosthesis, dropped the leg back into position, removed trial components, and irrigated copiously. The final stem and head was then placed, the leg brought back up, the system reduced and fluoroscopy used to verify positioning.  We irrigated, obtained hemostasis and closed the capsule using #2 ethibond suture.  One gram of vancomycin powder was placed in the surgical bed.   One gram of topical tranexamic acid was injected into the joint.  The fascia was closed with #1 vicryl plus, the deep fat layer was closed with 0 vicryl, the subcutaneous layers closed with 2.0 Vicryl Plus and the skin closed with 2.0 nylon and dermabond. A sterile dressing was applied. The patient was awakened in the operating room and taken to recovery in stable condition.  All sponge, needle, and instrument counts were correct at the end of the case.   Tessa Lerner, my PA, was a medical necessity for opening, closing, limb positioning, retracting, exposing, and overall facilitation and timely completion of the surgery.  Position: supine  Complications: see description of procedure.  Time  Out: performed   Drains/Packing: none  Estimated blood loss: see anesthesia record  Returned to Recovery Room: in good condition.   Antibiotics: yes   Mechanical VTE (DVT)  Prophylaxis: sequential compression devices, TED thigh-high  Chemical VTE (DVT) Prophylaxis: xarelto   Fluid Replacement: see anesthesia record  Specimens Removed: 1 to pathology   Sponge and Instrument Count Correct? yes   PACU: portable radiograph - low AP   Plan/RTC: Return in 2 weeks for staple removal. Weight Bearing/Load Lower Extremity: full  Hip precautions: none Suture Removal: 2 weeks   N. Glee Arvin, MD [facility] 11:13 AM   Implant Name Type Inv. Item Serial No. Manufacturer Lot No. LRB No. Used Action  ACETAB CUP Cathe Mons - [id number] Plate ACETAB CUP W GRIPTION  [employer] [id number] Left 1 Implanted  LINER NEUTRAL 54X36MM PLUS 4 - [id number] Hips LINER NEUTRAL 54X36MM PLUS 4  [employer] [id number] Left 1 Implanted  SCREW 6.5MMX25MM - [id number] Screw SCREW 6.5MMX25MM  [employer] [id number] Left 1 Implanted  STEM FEMORAL SZ6 HIGH ACTIS - [id number] Stem STEM FEMORAL SZ6 HIGH ACTIS  [employer]PU[employer] [id number] Left 1 Implanted  HEAD CERAMIC DELTA 36 PLUS 1.5 - [id number] Hips HEAD CERAMIC DELTA 36 PLUS 1.5  [employer] [id number] Left 1 Implanted

## 2021-05-06 NOTE — Anesthesia Postprocedure Evaluation (Signed)
Anesthesia Post Note  Patient: Antonio Nash  Procedure(s) Performed: LEFT TOTAL HIP ARTHROPLASTY ANTERIOR APPROACH (Left: Hip)     Patient location during evaluation: PACU Anesthesia Type: General Level of consciousness: awake and alert Pain management: pain level controlled Vital Signs Assessment: post-procedure vital signs reviewed and stable Respiratory status: spontaneous breathing, nonlabored ventilation, respiratory function stable and patient connected to nasal cannula oxygen Cardiovascular status: blood pressure returned to baseline and stable Postop Assessment: no apparent nausea or vomiting Anesthetic complications: no   No notable events documented.  Last Vitals:  Vitals:   05/06/21 1230 05/06/21 1257  BP: (!) 158/81 (!) 159/91  Pulse: 65 65  Resp: 12 20  Temp: (!) 36.2 C 36.4 C  SpO2: 96% 98%    Last Pain:  Vitals:   05/06/21 1257  TempSrc: Oral  PainSc:                  [name]

## 2021-05-06 NOTE — Anesthesia Procedure Notes (Signed)
Procedure Name: Intubation Date/Time: 05/06/2021 9:43 AM Performed by: Rosiland Oz, CRNA Pre-anesthesia Checklist: Patient identified, Emergency Drugs available, Suction available, Patient being monitored and Timeout performed Patient Re-evaluated:Patient Re-evaluated prior to induction Oxygen Delivery Method: Circle system utilized Preoxygenation: Pre-oxygenation with 100% oxygen Induction Type: IV induction Ventilation: Mask ventilation without difficulty Laryngoscope Size: Miller and 3 Grade View: Grade I Tube type: Oral Tube size: 7.5 mm Number of attempts: 1 Airway Equipment and Method: Stylet Placement Confirmation: ETT inserted through vocal cords under direct vision, positive ETCO2 and breath sounds checked- equal and bilateral Secured at: 21 cm Tube secured with: Tape Dental Injury: Teeth and Oropharynx as per pre-operative assessment

## 2021-05-06 NOTE — Plan of Care (Signed)
WNL

## 2021-05-06 NOTE — Anesthesia Preprocedure Evaluation (Signed)
Anesthesia Evaluation  Patient identified by MRN, date of birth, ID band Patient awake    Reviewed: Allergy & Precautions, H&P , NPO status , Patient's Chart, lab work & pertinent test results, reviewed documented beta blocker date and time   Airway Mallampati: II  TM Distance: >3 FB Neck ROM: full    Dental no notable dental hx.    Pulmonary neg pulmonary ROS,    Pulmonary exam normal breath sounds clear to auscultation       Cardiovascular Exercise Tolerance: Good hypertension, negative cardio ROS   Rhythm:regular Rate:Normal     Neuro/Psych negative neurological ROS  negative psych ROS   GI/Hepatic negative GI ROS, Neg liver ROS,   Endo/Other  negative endocrine ROSdiabetes  Renal/GU negative Renal ROS  negative genitourinary   Musculoskeletal   Abdominal   Peds  Hematology negative hematology ROS (+)   Anesthesia Other Findings   Reproductive/Obstetrics negative OB ROS                             Anesthesia Physical Anesthesia Plan  ASA: 3  Anesthesia Plan: Spinal and MAC   Post-op Pain Management:    Induction:   PONV Risk Score and Plan: 1  Airway Management Planned: Nasal Cannula and Natural Airway  Additional Equipment: None  Intra-op Plan:   Post-operative Plan:   Informed Consent: I have reviewed the patients History and Physical, chart, labs and discussed the procedure including the risks, benefits and alternatives for the proposed anesthesia with the patient or authorized representative who has indicated his/her understanding and acceptance.     Dental Advisory Given  Plan Discussed with: CRNA and Anesthesiologist  Anesthesia Plan Comments:         Anesthesia Quick Evaluation

## 2021-05-06 NOTE — Transfer of Care (Signed)
Immediate Anesthesia Transfer of Care Note  Patient: Antonio Nash  Procedure(s) Performed: LEFT TOTAL HIP ARTHROPLASTY ANTERIOR APPROACH (Left: Hip)  Patient Location: PACU  Anesthesia Type:General  Level of Consciousness: drowsy and patient cooperative  Airway & Oxygen Therapy: Patient Spontanous Breathing  Post-op Assessment: Report given to RN and Post -op Vital signs reviewed and stable  Post vital signs: Reviewed and stable  Last Vitals:  Vitals Value Taken Time  BP    Temp    Pulse 78 05/06/21 1148  Resp 13 05/06/21 1148  SpO2 98 % 05/06/21 1148  Vitals shown include unvalidated device data.  Last Pain:  Vitals:   05/06/21 0810  TempSrc:   PainSc: 0-No pain         Complications: No notable events documented.

## 2021-05-06 NOTE — H&P (Signed)
PREOPERATIVE H&P  Chief Complaint: left hip degenerative joint disease  HPI: Antonio Nash is a 65 y.o. male who presents for surgical treatment of left hip degenerative joint disease.  He denies any changes in medical history.  Past Medical History:  Diagnosis Date   Diabetes mellitus without complication (HCC)    Hypertension    PE (pulmonary thromboembolism) (HCC) 10/09/2020   Pneumonia 09/2020   Past Surgical History:  Procedure Laterality Date   COLONOSCOPY     HERNIA REPAIR     KNEE SURGERY Right    ROTATOR CUFF REPAIR     Social History   Socioeconomic History   Marital status: Married    Spouse name: Not on file   Number of children: 4   Years of education: Not on file   Highest education level: Not on file  Occupational History   Not on file  Tobacco Use   Smoking status: Never   Smokeless tobacco: Never  Vaping Use   Vaping Use: Never used  Substance and Sexual Activity   Alcohol use: Not Currently   Drug use: Never   Sexual activity: Yes  Other Topics Concern   Not on file  Social History Narrative   Not on file   Social Determinants of Health   Financial Resource Strain: Not on file  Food Insecurity: Not on file  Transportation Needs: Not on file  Physical Activity: Not on file  Stress: Not on file  Social Connections: Not on file   History reviewed. No pertinent family history. Allergies  Allergen Reactions   Codeine Itching    Skin crawls   Metformin Diarrhea   Prior to Admission medications   Medication Sig Start Date End Date Taking? Authorizing Provider  acetaminophen (TYLENOL) 500 MG tablet Take 500 mg by mouth every 6 (six) hours as needed for mild pain.   Yes [provider]  amLODipine (NORVASC) 10 MG tablet Take 10 mg by mouth daily.   Yes [provider]  capsaicin (ZOSTRIX) 0.025 % cream Apply 1 application topically 2 (two) times daily as needed (pain). 10/25/20  Yes [provider]   carvedilol (COREG) 25 MG tablet Take 12.5 mg by mouth daily.   Yes [provider]  diphenhydrAMINE (BENADRYL) 25 MG tablet Take 25 mg by mouth every 6 (six) hours as needed for allergies.   Yes [provider]  empagliflozin (JARDIANCE) 25 MG TABS tablet Take 12.5 mg by mouth daily.   Yes [provider]  fluticasone (FLONASE) 50 MCG/ACT nasal spray Place 1 spray into both nostrils daily as needed for allergies. 03/13/21  Yes [provider]  glimepiride (AMARYL) 4 MG tablet Take 4 mg by mouth daily with breakfast.   Yes [provider]  lisinopril (ZESTRIL) 40 MG tablet Take 40 mg by mouth daily. 08/03/17  Yes [provider]  naproxen sodium (ALEVE) 220 MG tablet Take 440 mg by mouth daily as needed (pain).   Yes [provider]  sildenafil (VIAGRA) 50 MG tablet TAKE ONE-HALF TABLET BY MOUTH AS DIRECTED (TAKE 1 HOUR PRIOR TO SEXUAL ACTIVITY *DO NOT EXCEED 1 DOSE PER 24 HOUR PERIOD*) 03/19/20  Yes [provider]  tamsulosin (FLOMAX) 0.4 MG CAPS capsule Take 0.4 mg by mouth at bedtime.   Yes [provider]  docusate sodium (COLACE) 100 MG capsule Take 1 capsule (100 mg total) by mouth daily as needed. 05/02/21 05/02/22  Cristie Hem, PA-C  methocarbamol (ROBAXIN) 500 MG  tablet Take 1 tablet (500 mg total) by mouth 2 (two) times daily as needed. To be taken after surgery 05/02/21   Cristie Hem, PA-C  ondansetron (ZOFRAN) 4 MG tablet Take 1 tablet (4 mg total) by mouth every 8 (eight) hours as needed for nausea or vomiting. 05/02/21   Cristie Hem, PA-C  oxyCODONE-acetaminophen (PERCOCET) 5-325 MG tablet Take 1-2 tablets by mouth every 6 (six) hours as needed. To be taken after surgery 05/02/21   Cristie Hem, PA-C  rivaroxaban (XARELTO) 10 MG TABS tablet Take 1 tablet (10 mg total) by mouth daily. To be taken after surgery to prevent blood clots 05/02/21   Cristie Hem, PA-C  sulfamethoxazole-trimethoprim  (BACTRIM DS) 800-160 MG tablet Take 1 tablet by mouth 2 (two) times daily. To be taken after surgery instead of keflex 05/02/21   Cristie Hem, PA-C     Positive ROS: All other systems have been reviewed and were otherwise negative with the exception of those mentioned in the HPI and as above.  Physical Exam: General: Alert, no acute distress Cardiovascular: No pedal edema Respiratory: No cyanosis, no use of accessory musculature GI: abdomen soft Skin: No lesions in the area of chief complaint Neurologic: Sensation intact distally Psychiatric: Patient is competent for consent with normal mood and affect Lymphatic: no lymphedema  MUSCULOSKELETAL: exam stable  Assessment: left hip degenerative joint disease  Plan: Plan for Procedure(s): LEFT TOTAL HIP ARTHROPLASTY ANTERIOR APPROACH  The risks benefits and alternatives were discussed with the patient including but not limited to the risks of nonoperative treatment, versus surgical intervention including infection, bleeding, nerve injury,  blood clots, cardiopulmonary complications, morbidity, mortality, among others, and they were willing to proceed.   Preoperative templating of the joint replacement has been completed, documented, and submitted to the Operating Room personnel in order to optimize intra-operative equipment management.   Glee Arvin, MD 05/06/2021 8:58 AM

## 2021-05-06 NOTE — Evaluation (Signed)
Physical Therapy Evaluation Patient Details Name: Antonio Nash MRN: [id number] DOB: 18-Jul-1956 Today's Date: 05/06/2021  History of Present Illness  Pt is a 65 y/o male s/p L THA, direct anterior, on 9/19. PMH includes DM, HTN, and PE.  Clinical Impression  Pt s/p surgery above with deficits below. Overall tolerated mobility well. Required min A for bed mobility and min guard A for transfers and gait using RW. Reports his wife will be able to assist as needed at d/c. Will continue to follow acutely.        Recommendations for follow up therapy are one component of a multi-disciplinary discharge planning process, led by the attending physician.  Recommendations may be updated based on patient status, additional functional criteria and insurance authorization.  Follow Up Recommendations Follow surgeon's recommendation for DC plan and follow-up therapies    Equipment Recommendations  3in1 (PT);Rolling walker with 5" wheels    Recommendations for Other Services       Precautions / Restrictions Precautions Precautions: Fall Restrictions Weight Bearing Restrictions: Yes LLE Weight Bearing: Weight bearing as tolerated      Mobility  Bed Mobility Overal bed mobility: Needs Assistance Bed Mobility: Supine to Sit;Sit to Supine     Supine to sit: Min assist Sit to supine: Min assist   General bed mobility comments: Required assist for LLE assist. Increased time required secondary to pain    Transfers Overall transfer level: Needs assistance Equipment used: Rolling walker (2 wheeled) Transfers: Sit to/from Stand Sit to Stand: Min guard         General transfer comment: Min guard for safety. Cues for hand placement.  Ambulation/Gait Ambulation/Gait assistance: Min guard Gait Distance (Feet): 70 Feet Assistive device: Rolling walker (2 wheeled) Gait Pattern/deviations: Step-through pattern;Decreased step length - right;Decreased step length - left;Decreased weight shift  to left;Antalgic Gait velocity: Decreased   General Gait Details: Slow, antalgic gait. Min guard for safety. Cues for sequencing using RW.  Stairs            Wheelchair Mobility    Modified Rankin (Stroke Patients Only)       Balance Overall balance assessment: Needs assistance Sitting-balance support: No upper extremity supported;Feet supported Sitting balance-Leahy Scale: Good     Standing balance support: Bilateral upper extremity supported;During functional activity Standing balance-Leahy Scale: Poor Standing balance comment: Reliant on UE support                             Pertinent Vitals/Pain Pain Assessment: Faces Faces Pain Scale: Hurts even more Pain Location: L hip Pain Descriptors / Indicators: Aching;Operative site guarding Pain Intervention(s): Limited activity within patient's tolerance;Monitored during session;Repositioned    Home Living Family/patient expects to be discharged to:: Private residence Living Arrangements: Spouse/significant other Available Help at Discharge: Family;Available 24 hours/day Type of Home: House Home Access: Stairs to enter Entrance Stairs-Rails: Right Entrance Stairs-Number of Steps: 3 Home Layout: One level Home Equipment: Walker - 4 wheels;Cane - single point      Prior Function Level of Independence: Independent with assistive device(s)         Comments: Reports he occasionally uses cane     Hand Dominance        Extremity/Trunk Assessment   Upper Extremity Assessment Upper Extremity Assessment: Overall WFL for tasks assessed    Lower Extremity Assessment Lower Extremity Assessment: LLE deficits/detail LLE Deficits / Details: Deficits consistent with post op pain and weakness.  Cervical / Trunk Assessment Cervical / Trunk Assessment: Normal  Communication   Communication: No difficulties  Cognition Arousal/Alertness: Awake/alert Behavior During Therapy: WFL for tasks  assessed/performed Overall Cognitive Status: Within Functional Limits for tasks assessed                                        General Comments General comments (skin integrity, edema, etc.): Pt's wife present during session.    Exercises     Assessment/Plan    PT Assessment Patient needs continued PT services  PT Problem List Decreased strength;Decreased balance;Decreased mobility;Decreased activity tolerance;Decreased knowledge of use of DME;Decreased knowledge of precautions;Pain       PT Treatment Interventions DME instruction;Gait training;Stair training;Therapeutic activities;Functional mobility training;Therapeutic exercise;Balance training;Patient/family education    PT Goals (Current goals can be found in the Care Plan section)  Acute Rehab PT Goals Patient Stated Goal: to go home PT Goal Formulation: With patient Time For Goal Achievement: 05/20/21 Potential to Achieve Goals: Good    Frequency 7X/week   Barriers to discharge        Co-evaluation               AM-PAC PT "6 Clicks" Mobility  Outcome Measure Help needed turning from your back to your side while in a flat bed without using bedrails?: A Little Help needed moving from lying on your back to sitting on the side of a flat bed without using bedrails?: A Little Help needed moving to and from a bed to a chair (including a wheelchair)?: A Little Help needed standing up from a chair using your arms (e.g., wheelchair or bedside chair)?: A Little Help needed to walk in hospital room?: A Little Help needed climbing 3-5 steps with a railing? : A Lot 6 Click Score: 17    End of Session Equipment Utilized During Treatment: Gait belt Activity Tolerance: Patient tolerated treatment well Patient left: in bed;with call bell/phone within reach;with family/visitor present Nurse Communication: Mobility status PT Visit Diagnosis: Unsteadiness on feet (R26.81);Muscle weakness (generalized)  (M62.81)    Time: 0100-7121 PT Time Calculation (min) (ACUTE ONLY): 21 min   Charges:   PT Evaluation $PT Eval Low Complexity: 1 Low          Cindee Salt, DPT  Acute Rehabilitation Services  Pager: [phone] Office: [phone]   [name] 05/06/2021, 3:18 PM

## 2021-05-07 ENCOUNTER — Encounter (HOSPITAL_COMMUNITY): Payer: Self-pay | Admitting: Orthopaedic Surgery

## 2021-05-07 DIAGNOSIS — M1612 Unilateral primary osteoarthritis, left hip: Secondary | ICD-10-CM | POA: Diagnosis not present

## 2021-05-07 LAB — BASIC METABOLIC PANEL
Anion gap: 14 (ref 5–15)
BUN: 17 mg/dL (ref 8–23)
CO2: 24 mmol/L (ref 22–32)
Calcium: 9.3 mg/dL (ref 8.9–10.3)
Chloride: 96 mmol/L — ABNORMAL LOW (ref 98–111)
Creatinine, Ser: 1.16 mg/dL (ref 0.61–1.24)
GFR, Estimated: >60 mL/min (ref >60)
Glucose, Bld: 150 mg/dL — ABNORMAL HIGH (ref 70–99)
Potassium: 4.3 mmol/L (ref 3.5–5.1)
Sodium: 134 mmol/L — ABNORMAL LOW (ref 135–145)

## 2021-05-07 LAB — CBC
HCT: 41.9 % (ref 39.0–52.0)
Hemoglobin: 13.2 g/dL (ref 13.0–17.0)
MCH: 28 pg (ref 26.0–34.0)
MCHC: 31.5 g/dL (ref 30.0–36.0)
MCV: 88.8 fL (ref 80.0–100.0)
Platelets: 267 10*3/uL (ref 150–400)
RBC: 4.72 MIL/uL (ref 4.22–5.81)
RDW: 13.3 % (ref 11.5–15.5)
WBC: 15 10*3/uL — ABNORMAL HIGH (ref 4.0–10.5)
nRBC: 0 % (ref 0.0–0.2)

## 2021-05-07 NOTE — Progress Notes (Signed)
Physical Therapy Treatment Patient Details Name: Antonio Nash MRN: [id number] DOB: 19-Dec-1955 Today's Date: 05/07/2021   History of Present Illness Pt is a 65 y/o male s/p L THA, direct anterior, on 9/19. PMH includes DM, HTN, and PE.    PT Comments    Pt made good progress towards his physical therapy goals during his inpatient stay. Ambulating 400 feet with a walker and negotiated 3 steps with a railing modI. Pt utilizing a step through pattern, with mild left sided gait abnormalities. Reviewed HEP for strengthening and ROM (provided written handout). Adequate for d/c home from a PT standpoint.    Recommendations for follow up therapy are one component of a multi-disciplinary discharge planning process, led by the attending physician.  Recommendations may be updated based on patient status, additional functional criteria and insurance authorization.  Follow Up Recommendations  Follow surgeon's recommendation for DC plan and follow-up therapies     Equipment Recommendations  3in1 (PT);Rolling walker with 5" wheels    Recommendations for Other Services       Precautions / Restrictions Precautions Precautions: Fall Restrictions Weight Bearing Restrictions: No     Mobility  Bed Mobility Overal bed mobility: Modified Independent Bed Mobility: Sit to Supine           General bed mobility comments: Hooking RLE underneath LLE to elevate back onto bed    Transfers Overall transfer level: Modified independent Equipment used: Rolling walker (2 wheeled)                Ambulation/Gait Ambulation/Gait assistance: Modified independent (Device/Increase time) Gait Distance (Feet): 400 Feet Assistive device: Rolling walker (2 wheeled) Gait Pattern/deviations: Step-through pattern;Decreased weight shift to left;Antalgic;Decreased dorsiflexion - left Gait velocity: Decreased   General Gait Details: Step through pattern, cues for increased left foot clearance and heel  strike, in addition to upright posture   Stairs Stairs: Yes Stairs assistance: Modified independent (Device/Increase time) Stair Management: One rail Right Number of Stairs: 3 General stair comments: Cues for sequencing/technique and step by step pattern   Wheelchair Mobility    Modified Rankin (Stroke Patients Only)       Balance Overall balance assessment: Needs assistance Sitting-balance support: No upper extremity supported;Feet supported Sitting balance-Leahy Scale: Good     Standing balance support: During functional activity;No upper extremity supported Standing balance-Leahy Scale: Fair                              Cognition Arousal/Alertness: Awake/alert Behavior During Therapy: WFL for tasks assessed/performed Overall Cognitive Status: Within Functional Limits for tasks assessed                                        Exercises Total Joint Exercises Quad Sets: Both;15 reps;Supine Heel Slides: Left;15 reps;Supine Hip ABduction/ADduction: Left;10 reps;Supine Long Arc Quad: AAROM;Left;10 reps;Seated    General Comments        Pertinent Vitals/Pain Pain Assessment: Faces Faces Pain Scale: Hurts a little bit Pain Location: L hip Pain Descriptors / Indicators: Operative site guarding Pain Intervention(s): Monitored during session    Home Living                      Prior Function            PT Goals (current goals can now be found in the care  plan section) Acute Rehab PT Goals Patient Stated Goal: to go home PT Goal Formulation: With patient Time For Goal Achievement: 05/20/21 Potential to Achieve Goals: Good Progress towards PT goals: Progressing toward goals    Frequency    7X/week      PT Plan Current plan remains appropriate    Co-evaluation              AM-PAC PT "6 Clicks" Mobility   Outcome Measure  Help needed turning from your back to your side while in a flat bed without using  bedrails?: None Help needed moving from lying on your back to sitting on the side of a flat bed without using bedrails?: None Help needed moving to and from a bed to a chair (including a wheelchair)?: None Help needed standing up from a chair using your arms (e.g., wheelchair or bedside chair)?: None Help needed to walk in hospital room?: None Help needed climbing 3-5 steps with a railing? : None 6 Click Score: 24    End of Session   Activity Tolerance: Patient tolerated treatment well Patient left: with call bell/phone within reach;in chair Nurse Communication: Mobility status PT Visit Diagnosis: Unsteadiness on feet (R26.81);Muscle weakness (generalized) (M62.81)     Time: 0912-0926 PT Time Calculation (min) (ACUTE ONLY): 14 min  Charges:  $Gait Training: 8-22 mins                     Lillia Pauls, PT, DPT Acute Rehabilitation Services Pager [phone] Office [phone]    [name] 05/07/2021, 10:49 AM

## 2021-05-07 NOTE — Discharge Summary (Signed)
Patient ID: Antonio Nash MRN: [id number] DOB/AGE: February 17, 1956 65 y.o.  Admit date: 05/06/2021 Discharge date: 05/07/2021  Admission Diagnoses:  Principal Problem:   Primary osteoarthritis of left hip Active Problems:   Status post total replacement of left hip   Discharge Diagnoses:  Same  Past Medical History:  Diagnosis Date   Diabetes mellitus without complication (HCC)    Hypertension    PE (pulmonary thromboembolism) (HCC) 10/09/2020   Pneumonia 09/2020    Surgeries: Procedure(s): LEFT TOTAL HIP ARTHROPLASTY ANTERIOR APPROACH on 05/06/2021   Consultants:   Discharged Condition: Improved  Hospital Course: Antonio Nash is an 65 y.o. male who was admitted 05/06/2021 for operative treatment ofPrimary osteoarthritis of left hip. Patient has severe unremitting pain that affects sleep, daily activities, and work/hobbies. After pre-op clearance the patient was taken to the operating room on 05/06/2021 and underwent  Procedure(s): LEFT TOTAL HIP ARTHROPLASTY ANTERIOR APPROACH.    Patient was given perioperative antibiotics:  Anti-infectives (From admission, onward)    Start     Dose/Rate Route Frequency Ordered Stop   05/06/21 1600  ceFAZolin (ANCEF) IVPB 2g/100 mL premix        2 g 200 mL/hr over 30 Minutes Intravenous Every 6 hours 05/06/21 1245 05/06/21 2231   05/06/21 1015  vancomycin (VANCOCIN) powder  Status:  Discontinued          As needed 05/06/21 1016 05/06/21 1143   05/06/21 0800  ceFAZolin (ANCEF) IVPB 2g/100 mL premix        2 g 200 mL/hr over 30 Minutes Intravenous On call to O.R. 05/06/21 0865 05/06/21 0950   05/06/21 0800  vancomycin (VANCOCIN) IVPB 1000 mg/200 mL premix        1,000 mg 200 mL/hr over 60 Minutes Intravenous  Once 05/06/21 0753 05/06/21 7846        Patient was given sequential compression devices, early ambulation, and chemoprophylaxis to prevent DVT.  Patient benefited maximally from hospital stay and there were no complications.     Recent vital signs: Patient Vitals for the past 24 hrs:  BP Temp Temp src Pulse Resp SpO2 Height Weight  05/07/21 0730 137/80 98 F (36.7 C) Oral 75 18 100 % -- --  05/07/21 0356 140/77 98.4 F (36.9 C) Oral 69 18 100 % -- --  05/06/21 2248 128/76 98.6 F (37 C) Oral 77 20 100 % -- --  05/06/21 1922 127/70 98.2 F (36.8 C) Oral 72 18 95 % -- --  05/06/21 1630 138/78 97.7 F (36.5 C) Oral 74 18 100 % -- --  05/06/21 1257 (!) 159/91 97.6 F (36.4 C) Oral 65 20 98 % -- --  05/06/21 1230 (!) 158/81 (!) 97.1 F (36.2 C) -- 65 12 96 % -- --  05/06/21 1215 (!) 149/93 -- -- 74 11 93 % -- --  05/06/21 1200 (!) 162/91 -- -- 73 18 99 % -- --  05/06/21 1145 (!) 152/82 (!) 97.1 F (36.2 C) -- 75 12 98 % -- --  05/06/21 0803 (!) 159/87 98.1 F (36.7 C) Oral 66 17 100 % 5\' 9"  (1.753 m) 92.1 kg     Recent laboratory studies:  Recent Labs    05/07/21 0418  WBC 15.0*  HGB 13.2  HCT 41.9  PLT 267  NA 134*  K 4.3  CL 96*  CO2 24  BUN 17  CREATININE 1.16  GLUCOSE 150*  CALCIUM 9.3     Discharge Medications:   Allergies as of 05/07/2021  Reactions   Codeine Itching   Skin crawls   Metformin Diarrhea        Medication List     STOP taking these medications    acetaminophen 500 MG tablet Commonly known as: TYLENOL   naproxen sodium 220 MG tablet Commonly known as: ALEVE       TAKE these medications    amLODipine 10 MG tablet Commonly known as: NORVASC Take 10 mg by mouth daily.   capsaicin 0.025 % cream Commonly known as: ZOSTRIX Apply 1 application topically 2 (two) times daily as needed (pain).   carvedilol 25 MG tablet Commonly known as: COREG Take 12.5 mg by mouth daily.   diphenhydrAMINE 25 MG tablet Commonly known as: BENADRYL Take 25 mg by mouth every 6 (six) hours as needed for allergies.   docusate sodium 100 MG capsule Commonly known as: Colace Take 1 capsule (100 mg total) by mouth daily as needed.   empagliflozin 25 MG Tabs  tablet Commonly known as: JARDIANCE Take 12.5 mg by mouth daily.   fluticasone 50 MCG/ACT nasal spray Commonly known as: FLONASE Place 1 spray into both nostrils daily as needed for allergies.   glimepiride 4 MG tablet Commonly known as: AMARYL Take 4 mg by mouth daily with breakfast.   lisinopril 40 MG tablet Commonly known as: ZESTRIL Take 40 mg by mouth daily.   methocarbamol 500 MG tablet Commonly known as: Robaxin Take 1 tablet (500 mg total) by mouth 2 (two) times daily as needed. To be taken after surgery   ondansetron 4 MG tablet Commonly known as: Zofran Take 1 tablet (4 mg total) by mouth every 8 (eight) hours as needed for nausea or vomiting.   oxyCODONE-acetaminophen 5-325 MG tablet Commonly known as: Percocet Take 1-2 tablets by mouth every 6 (six) hours as needed. To be taken after surgery   rivaroxaban 10 MG Tabs tablet Commonly known as: XARELTO Take 1 tablet (10 mg total) by mouth daily. To be taken after surgery to prevent blood clots   sildenafil 50 MG tablet Commonly known as: VIAGRA TAKE ONE-HALF TABLET BY MOUTH AS DIRECTED (TAKE 1 HOUR PRIOR TO SEXUAL ACTIVITY *DO NOT EXCEED 1 DOSE PER 24 HOUR PERIOD*)   sulfamethoxazole-trimethoprim 800-160 MG tablet Commonly known as: BACTRIM DS Take 1 tablet by mouth 2 (two) times daily. To be taken after surgery instead of keflex   tamsulosin 0.4 MG Caps capsule Commonly known as: FLOMAX Take 0.4 mg by mouth at bedtime.               Durable Medical Equipment  (From admission, onward)           Start     Ordered   05/06/21 1310  DME Walker rolling  Once       Question:  Patient needs a walker to treat with the following condition  Answer:  History of hip replacement   05/06/21 1309   05/06/21 1310  DME 3 n 1  Once        05/06/21 1309   05/06/21 1310  DME Bedside commode  Once       Question:  Patient needs a bedside commode to treat with the following condition  Answer:  History of hip  replacement   05/06/21 1309            Diagnostic Studies: DG Pelvis Portable  Result Date: 05/06/2021 CLINICAL DATA:  Left total hip arthroplasty. EXAM: PORTABLE PELVIS 1-2 VIEWS COMPARISON:  None. FINDINGS: Left total  hip arthroplasty. Femoral stem appears well seated. Associated subcutaneous and joint air. Moderate right hip osteoarthritis with superior joint space narrowing, subchondral sclerosis and osteophytosis. IMPRESSION: 1. Left total hip arthroplasty with expected postoperative findings. 2. Moderate right hip osteoarthritis. Electronically Signed   By: Leanna Battles M.D.   On: 05/06/2021 14:03   DG C-Arm 1-60 Min-No Report  Result Date: 05/06/2021 Fluoroscopy was utilized by the requesting physician.  No radiographic interpretation.   DG C-Arm 1-60 Min-No Report  Result Date: 05/06/2021 Fluoroscopy was utilized by the requesting physician.  No radiographic interpretation.   DG HIP UNILAT WITH PELVIS 2-3 VIEWS LEFT  Result Date: 05/06/2021 CLINICAL DATA:  Left total hip arthroplasty. EXAM: DG HIP (WITH OR WITHOUT PELVIS) 2-3V LEFT COMPARISON:  None. FINDINGS: Four intraoperative fluoroscopic spot views of the left hip are submitted. The 2 initial images show advanced degenerative changes in the left hip. Subsequent 2 images show left hip arthroplasty without complicating feature. IMPRESSION: Intraoperative visualization a left hip arthroplasty. Electronically Signed   By: Leanna Battles M.D.   On: 05/06/2021 14:02    Disposition: Discharge disposition: 01-Home or Self Care          Follow-up Information     Tarry Kos, MD. Schedule an appointment as soon as possible for a visit in 2 day(s).   Specialty: Orthopedic Surgery Contact information: [address] [place] [address] [phone]                  Signed: Cristie Hem 05/07/2021, 7:51 AM

## 2021-05-07 NOTE — Progress Notes (Signed)
Subjective: 1 Day Post-Op Procedure(s) (LRB): LEFT TOTAL HIP ARTHROPLASTY ANTERIOR APPROACH (Left) Patient reports pain as mild.    Objective: Vital signs in last 24 hours: Temp:  [97.1 F (36.2 C)-98.6 F (37 C)] 98 F (36.7 C) (09/20 0730) Pulse Rate:  [65-77] 75 (09/20 0730) Resp:  [11-20] 18 (09/20 0730) BP: (127-162)/(70-93) 137/80 (09/20 0730) SpO2:  [93 %-100 %] 100 % (09/20 0730) Weight:  [92.1 kg] 92.1 kg (09/19 0803)  Intake/Output from previous day: 09/19 0701 - 09/20 0700 In: 1300 [I.V.:1300] Out: 500 [Urine:400; Blood:100] Intake/Output this shift: No intake/output data recorded.  Recent Labs    05/07/21 0418  HGB 13.2   Recent Labs    05/07/21 0418  WBC 15.0*  RBC 4.72  HCT 41.9  PLT 267   Recent Labs    05/07/21 0418  NA 134*  K 4.3  CL 96*  CO2 24  BUN 17  CREATININE 1.16  GLUCOSE 150*  CALCIUM 9.3   No results for input(s): LABPT, INR in the last 72 hours.  Neurologically intact Neurovascular intact Sensation intact distally Intact pulses distally Dorsiflexion/Plantar flexion intact Incision: dressing C/D/I No cellulitis present Compartment soft   Assessment/Plan: 1 Day Post-Op Procedure(s) (LRB): LEFT TOTAL HIP ARTHROPLASTY ANTERIOR APPROACH (Left) Advance diet Up with therapy D/C IV fluids Discharge home with home health WBAT LLE       Cristie Hem 05/07/2021, 7:50 AM

## 2021-05-07 NOTE — Progress Notes (Addendum)
Patient alert and oriented, mae's well, voiding adequate amount of urine, swallowing without difficulty, no c/o pain at time of discharge. Patient discharged home with family. Script and discharged instructions given to patient. Patient and wife stated understanding of instructions given. Patient has an appointment with Dr. Roda Shutters.

## 2021-05-21 ENCOUNTER — Ambulatory Visit (INDEPENDENT_AMBULATORY_CARE_PROVIDER_SITE_OTHER): Payer: No Typology Code available for payment source | Admitting: Physician Assistant

## 2021-05-21 ENCOUNTER — Other Ambulatory Visit: Payer: Self-pay

## 2021-05-21 ENCOUNTER — Encounter: Payer: Self-pay | Admitting: Orthopaedic Surgery

## 2021-05-21 DIAGNOSIS — Z96642 Presence of left artificial hip joint: Secondary | ICD-10-CM

## 2021-05-21 NOTE — Progress Notes (Signed)
   Post-Op Visit Note   Patient: Antonio Nash           Date of Birth: [birth year]           MRN: [id number] Visit Date: 05/21/2021 PCP: Patient, No Pcp Per (Inactive)   Assessment & Plan:  Chief Complaint:  Chief Complaint  Patient presents with   Left Hip - Pain   Visit Diagnoses:  1. Status post total hip replacement, left     Plan: Patient is a pleasant 65 year old gentleman who comes in today 2 weeks status post left total hip replacement 05/06/2021.  He is doing well.  He has finished home health physical therapy and is ambulating with a cane.  He is taking an occasional oxycodone at night.  Overall, doing well.  Examination of the left hip reveals a well-healing surgical incision with nylon sutures in place.  No evidence of infection or cellulitis.  Calves are soft nontender.  He is neurovascular intact distally.  Today, sutures were removed and Steri-Strips applied.  We will continue to increase activity as tolerated.  Dental prophylaxis reinforced.  Follow-up with [place] in 4 weeks time for repeat evaluation and AP pelvis x-rays.  Call with concerns or questions in the meantime.  Follow-Up Instructions: Return in about 4 weeks (around 06/18/2021).   Orders:  No orders of the defined types were placed in this encounter.  No orders of the defined types were placed in this encounter.   Imaging: No new imaging  PMFS History: Patient Active Problem List   Diagnosis Date Noted   Status post total replacement of left hip 05/06/2021   Displaced fracture of lateral malleolus of right fibula, initial encounter for closed fracture 12/21/2020   Lobar pneumonia, unspecified organism (HCC)    DVT (deep venous thrombosis) (HCC) 10/09/2020   Pulmonary emboli (HCC) 10/08/2020   Primary osteoarthritis of left hip 10/02/2020   Discitis of thoracolumbar region 09/19/2020   Medication monitoring encounter 09/19/2020   Osteomyelitis (HCC) 08/31/2020   HTN (hypertension) 08/31/2020   Type  2 diabetes mellitus without complication (HCC) 08/31/2020   Past Medical History:  Diagnosis Date   Diabetes mellitus without complication (HCC)    Hypertension    PE (pulmonary thromboembolism) (HCC) 10/09/2020   Pneumonia 09/2020    History reviewed. No pertinent family history.  Past Surgical History:  Procedure Laterality Date   COLONOSCOPY     HERNIA REPAIR     KNEE SURGERY Right    ROTATOR CUFF REPAIR     TOTAL HIP ARTHROPLASTY Left 05/06/2021   Procedure: LEFT TOTAL HIP ARTHROPLASTY ANTERIOR APPROACH;  Surgeon: Tarry Kos, MD;  Location: [facility] OR;  Service: Orthopedics;  Laterality: Left;   Social History   Occupational History   Not on file  Tobacco Use   Smoking status: Never   Smokeless tobacco: Never  Vaping Use   Vaping Use: Never used  Substance and Sexual Activity   Alcohol use: Not Currently   Drug use: Never   Sexual activity: Yes

## 2021-06-03 ENCOUNTER — Other Ambulatory Visit: Payer: Self-pay | Admitting: Physician Assistant

## 2021-06-19 ENCOUNTER — Ambulatory Visit (INDEPENDENT_AMBULATORY_CARE_PROVIDER_SITE_OTHER): Payer: No Typology Code available for payment source | Admitting: Orthopaedic Surgery

## 2021-06-19 ENCOUNTER — Other Ambulatory Visit: Payer: Self-pay

## 2021-06-19 ENCOUNTER — Ambulatory Visit: Payer: Self-pay

## 2021-06-19 ENCOUNTER — Encounter: Payer: Self-pay | Admitting: Orthopaedic Surgery

## 2021-06-19 DIAGNOSIS — Z96642 Presence of left artificial hip joint: Secondary | ICD-10-CM

## 2021-06-19 NOTE — Progress Notes (Signed)
   Post-Op Visit Note   Patient: Antonio Nash           Date of Birth: 1955-08-21           MRN: [id number] Visit Date: 06/19/2021 PCP: Patient, No Pcp Per (Inactive)   Assessment & Plan:  Chief Complaint:  Chief Complaint  Patient presents with   Left Hip - Pain   Visit Diagnoses:  1. Status post total hip replacement, left     Plan: Mr. Gignac is 6 weeks status post left total hip replacement.  He has some mild pain at times and stiffness after prolonged sitting but overall he is very happy and has no complaints.  Takes Tylenol and Aleve as needed.  Left hip surgical scar is healed.  No signs of infection.  He has excellent range of motion without pain.  The x-rays show a stable hip replacement without any complications.  Changes doing excellent at this time.  He requested to return back to work in a couple weeks and I feel that it is appropriate based on his description of what he does at work and I think in 2 weeks he will be even stronger and should be able to return back to work.  Recheck in 6 weeks.  Dental prophylaxis reinforced.  Follow-Up Instructions: Return in about 6 weeks (around 07/31/2021).   Orders:  Orders Placed This Encounter  Procedures   XR Pelvis 1-2 Views   No orders of the defined types were placed in this encounter.   Imaging: XR Pelvis 1-2 Views  Result Date: 06/19/2021 Stable total hip replacement without complications   PMFS History: Patient Active Problem List   Diagnosis Date Noted   Status post total replacement of left hip 05/06/2021   Displaced fracture of lateral malleolus of right fibula, initial encounter for closed fracture 12/21/2020   Lobar pneumonia, unspecified organism (HCC)    DVT (deep venous thrombosis) (HCC) 10/09/2020   Pulmonary emboli (HCC) 10/08/2020   Primary osteoarthritis of left hip 10/02/2020   Discitis of thoracolumbar region 09/19/2020   Medication monitoring encounter 09/19/2020   Osteomyelitis (HCC)  08/31/2020   HTN (hypertension) 08/31/2020   Type 2 diabetes mellitus without complication (HCC) 08/31/2020   Past Medical History:  Diagnosis Date   Diabetes mellitus without complication (HCC)    Hypertension    PE (pulmonary thromboembolism) (HCC) 10/09/2020   Pneumonia 09/2020    History reviewed. No pertinent family history.  Past Surgical History:  Procedure Laterality Date   COLONOSCOPY     HERNIA REPAIR     KNEE SURGERY Right    ROTATOR CUFF REPAIR     TOTAL HIP ARTHROPLASTY Left 05/06/2021   Procedure: LEFT TOTAL HIP ARTHROPLASTY ANTERIOR APPROACH;  Surgeon: Tarry Kos, MD;  Location: [facility] OR;  Service: Orthopedics;  Laterality: Left;   Social History   Occupational History   Not on file  Tobacco Use   Smoking status: Never   Smokeless tobacco: Never  Vaping Use   Vaping Use: Never used  Substance and Sexual Activity   Alcohol use: Not Currently   Drug use: Never   Sexual activity: Yes

## 2021-06-26 DIAGNOSIS — E1169 Type 2 diabetes mellitus with other specified complication: Secondary | ICD-10-CM | POA: Diagnosis not present

## 2021-06-26 DIAGNOSIS — U071 COVID-19: Secondary | ICD-10-CM | POA: Diagnosis not present

## 2021-06-26 DIAGNOSIS — E785 Hyperlipidemia, unspecified: Secondary | ICD-10-CM | POA: Diagnosis not present

## 2021-07-05 DIAGNOSIS — E119 Type 2 diabetes mellitus without complications: Secondary | ICD-10-CM | POA: Diagnosis not present

## 2021-07-30 ENCOUNTER — Other Ambulatory Visit: Payer: Self-pay

## 2021-07-30 ENCOUNTER — Ambulatory Visit (INDEPENDENT_AMBULATORY_CARE_PROVIDER_SITE_OTHER): Payer: No Typology Code available for payment source | Admitting: Orthopaedic Surgery

## 2021-07-30 ENCOUNTER — Encounter: Payer: Self-pay | Admitting: Orthopaedic Surgery

## 2021-07-30 DIAGNOSIS — Z96642 Presence of left artificial hip joint: Secondary | ICD-10-CM

## 2021-07-30 NOTE — Progress Notes (Signed)
   Post-Op Visit Note   Patient: Antonio Nash           Date of Birth: April 02, 1956           MRN: [id number] Visit Date: 07/30/2021 PCP: Patient, No Pcp Per (Inactive)   Assessment & Plan:  Chief Complaint:  Chief Complaint  Patient presents with   Left Hip - Follow-up    Left total hip arthroplasty 05/06/2021   Visit Diagnoses:  1. Status post total hip replacement, left     Plan: Gardy is 3 months status post left total hip replacement.  The only complaint is that he has some lateral hip tenderness this worse with sitting.  Aleve does relieve the pain.  Overall he is very happy with his recovery and outcome.  Left hip scar is fully healed.  He is normal ambulation and gait without any assistive devices.  He is slightly tender to the posterior lateral aspect of the greater trochanter.  From my standpoint Kayin is doing very well from the surgery.  He may be having some bursitis from the recent surgery but I do not recommend doing an injection at this time.  I recommend continuing to treat this with oral NSAIDs and to give this a little more time.  We will recheck him in 3 months with AP and lateral hip x-rays.  Dental prophylaxis reinforced.  Follow-Up Instructions: Return in about 3 months (around 10/28/2021).   Orders:  No orders of the defined types were placed in this encounter.  No orders of the defined types were placed in this encounter.   Imaging: No results found.  PMFS History: Patient Active Problem List   Diagnosis Date Noted   Status post total replacement of left hip 05/06/2021   Displaced fracture of lateral malleolus of right fibula, initial encounter for closed fracture 12/21/2020   Lobar pneumonia, unspecified organism (HCC)    DVT (deep venous thrombosis) (HCC) 10/09/2020   Pulmonary emboli (HCC) 10/08/2020   Primary osteoarthritis of left hip 10/02/2020   Discitis of thoracolumbar region 09/19/2020   Medication monitoring encounter 09/19/2020    Osteomyelitis (HCC) 08/31/2020   HTN (hypertension) 08/31/2020   Type 2 diabetes mellitus without complication (HCC) 08/31/2020   Past Medical History:  Diagnosis Date   Diabetes mellitus without complication (HCC)    Hypertension    PE (pulmonary thromboembolism) (HCC) 10/09/2020   Pneumonia 09/2020    No family history on file.  Past Surgical History:  Procedure Laterality Date   COLONOSCOPY     HERNIA REPAIR     KNEE SURGERY Right    ROTATOR CUFF REPAIR     TOTAL HIP ARTHROPLASTY Left 05/06/2021   Procedure: LEFT TOTAL HIP ARTHROPLASTY ANTERIOR APPROACH;  Surgeon: Tarry Kos, MD;  Location: [facility] OR;  Service: Orthopedics;  Laterality: Left;   Social History   Occupational History   Not on file  Tobacco Use   Smoking status: Never   Smokeless tobacco: Never  Vaping Use   Vaping Use: Never used  Substance and Sexual Activity   Alcohol use: Not Currently   Drug use: Never   Sexual activity: Yes

## 2021-08-27 IMAGING — MR MR THORACIC SPINE WO/W CM
5 of 9 series · 18 of 48 positions shown · IV contrast (gadavist)
Comparison: Thoracic MRI 08/28/2020.  Chest CTA 10/08/2020.

CLINICAL DATA: New raised area in the T5-6 region without pain or
drainage. History of osteomyelitis.

EXAM:
MRI THORACIC WITHOUT AND WITH CONTRAST
TECHNIQUE: Multiplanar and multiecho pulse sequences of the thoracic spine were
obtained without and with intravenous contrast.
CONTRAST:  8mL GADAVIST GADOBUTROL 1 MMOL/ML IV SOLN

[Series 2: T1 · sagittal · 3.0mm · 0.90mm/px · 2 of 15 slices shown (1 of 3)]
[im 1/15]
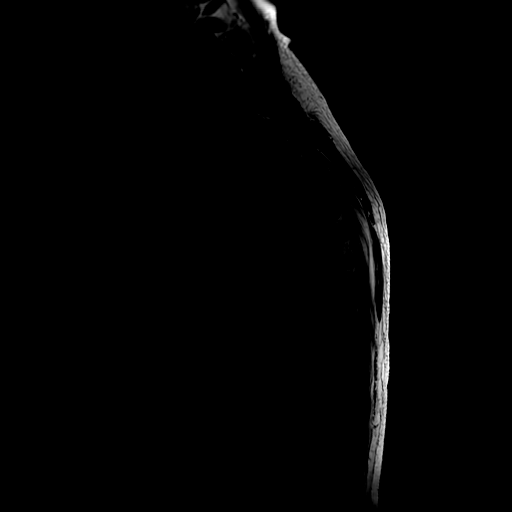
[im 15/15]
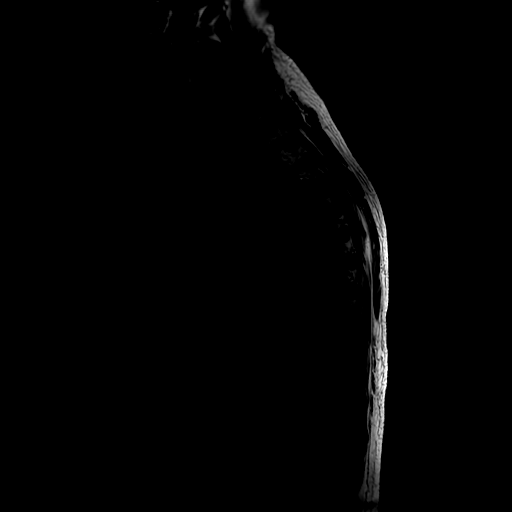

[Series 3: T2 · sagittal · 3.0mm · 0.66mm/px · 3 of 17 slices shown (1 of 2)]
[im 1/17]
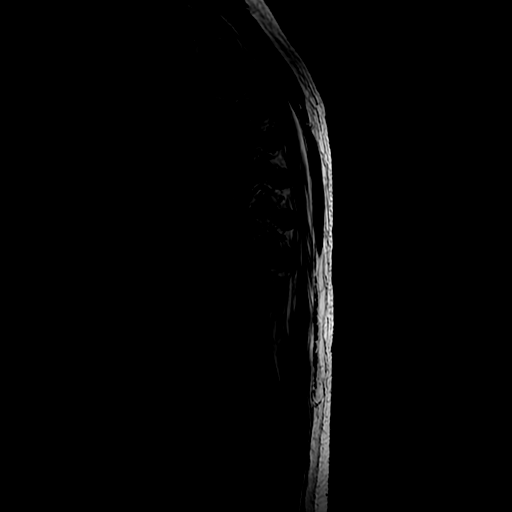
[im 9/17]
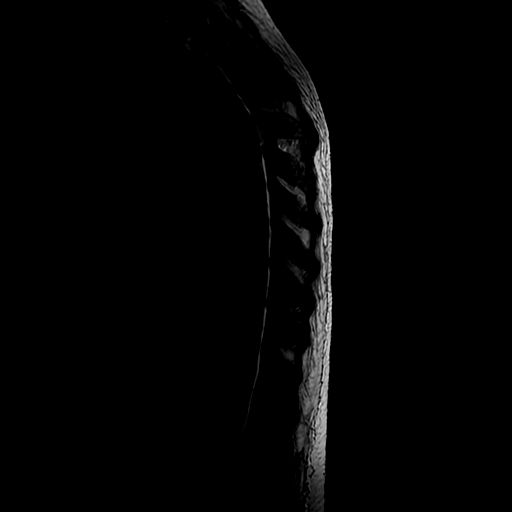
[im 17/17]
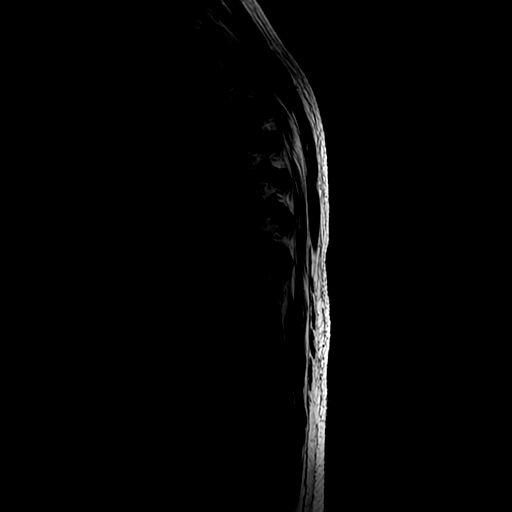

[Series 5: T1 · sagittal · 3.0mm · 0.66mm/px · 3 of 17 slices shown (2 of 3)]
[im 1/17]
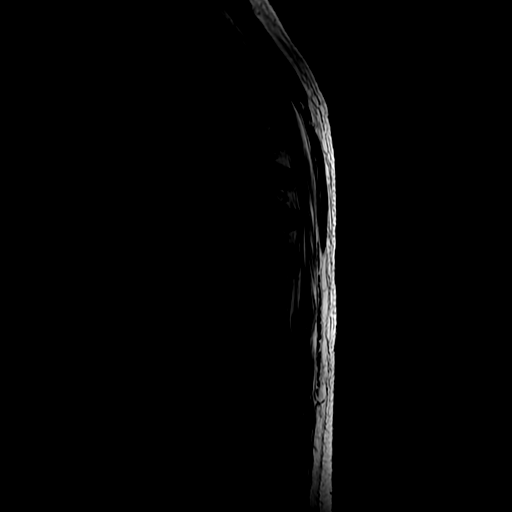
[im 9/17]
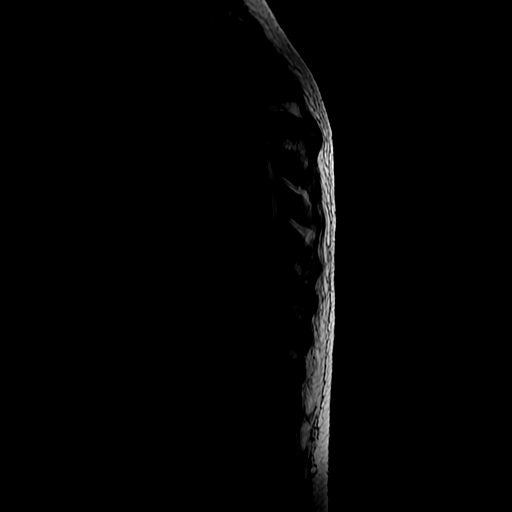
[im 17/17]
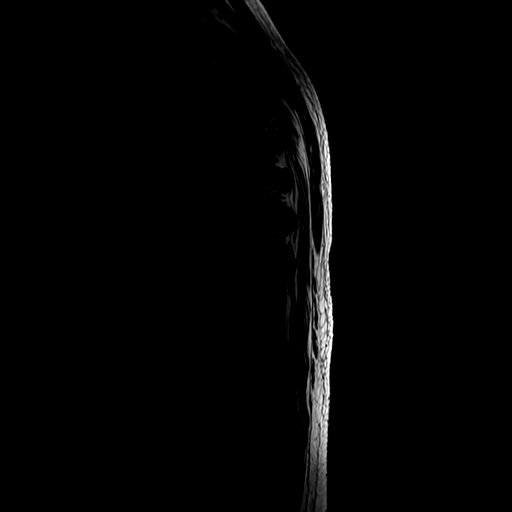

[Series 6: T2 · axial · 4.0mm · 0.39mm/px · z∈[-209,+15]mm · 6 of 39 slices shown (2 of 2)]
[im 1/39]
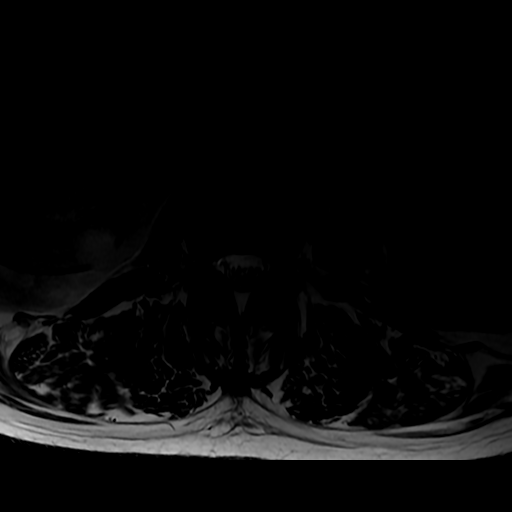
[im 8/39]
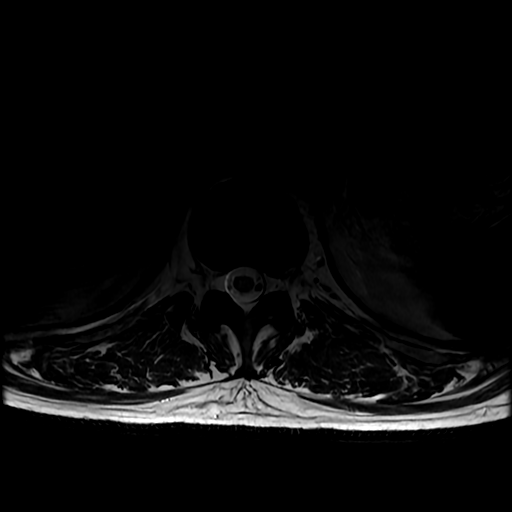
[im 16/39]
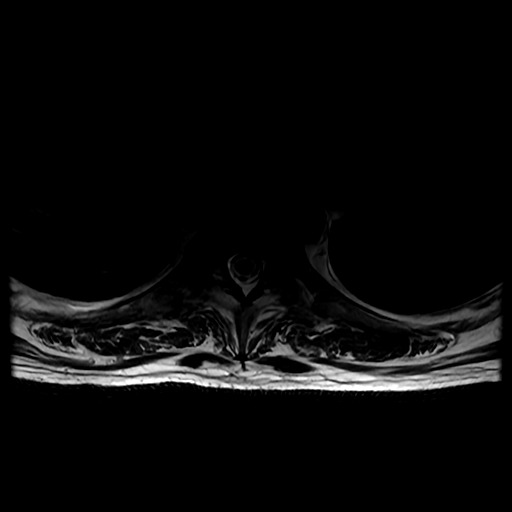
[im 23/39]
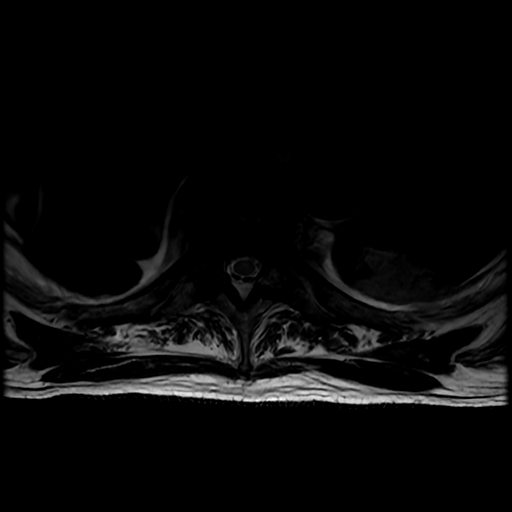
[im 31/39]
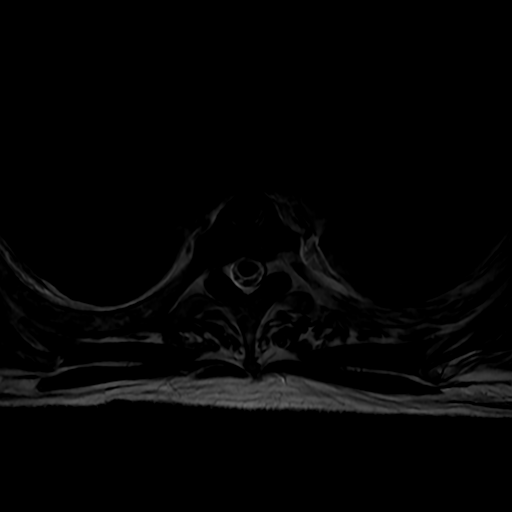
[im 39/39]
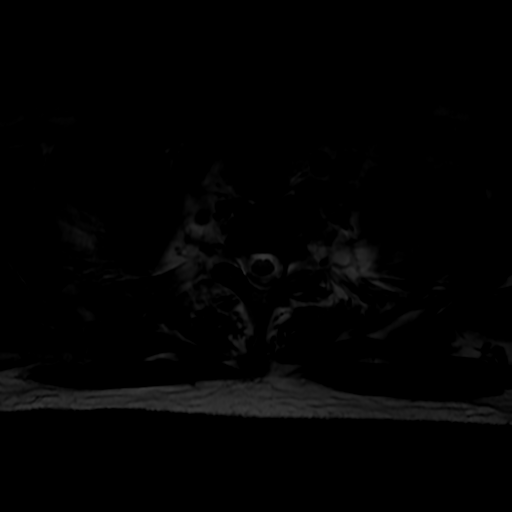

[Series 8: T1 · axial · non-contrast · 3.0mm · 0.39mm/px · z∈[-211,-85]mm · 4 of 64 slices shown (3 of 3)]
[im 1/64]
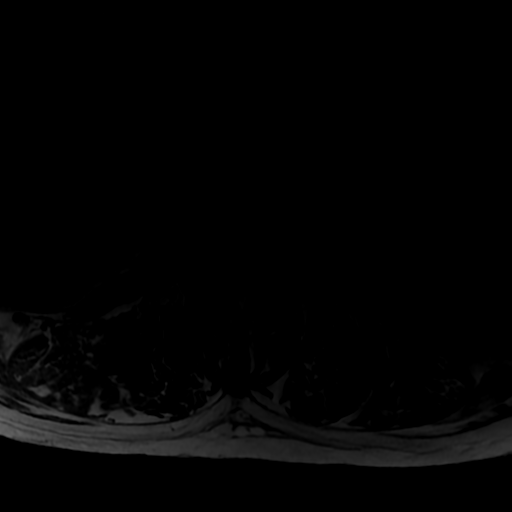
[im 13/64]
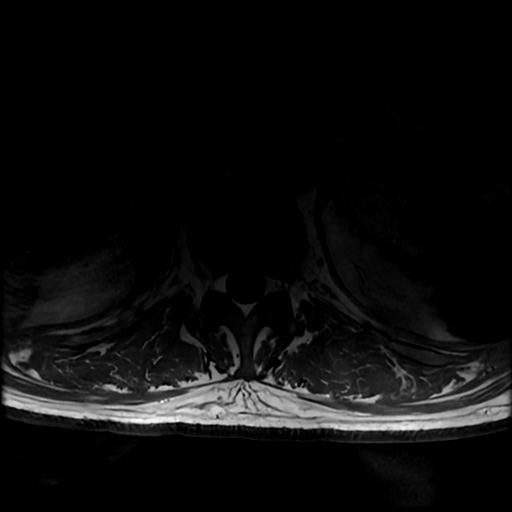
[im 19/64]
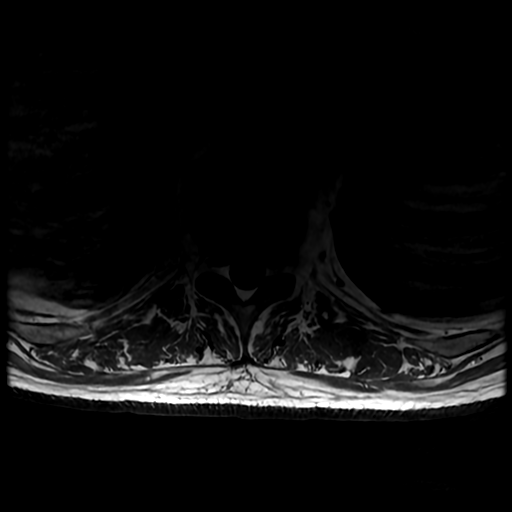
[im 26/64]
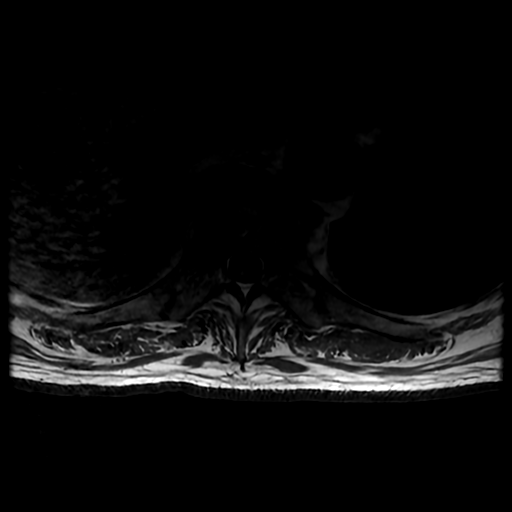

[18 of 48 positions shown; findings below may reference images not displayed]

FINDINGS: Alignment:  Mild kyphosis at T5-6.

Vertebrae: Changes of chronic discitis and osteomyelitis are again
noted at T5-6, as seen on previous MRI. The degree of marrow edema
in the vertebral bodies and posterior elements has slightly
improved. The degree of osseous retropulsion appears unchanged, with
mass effect on the thoracic cord. No progressive bone destruction
identified. Diffuse vertebral body enhancement is present following
contrast. Contrast was not previously given. There is a stable
enhancing lesion posteriorly in the T7 vertebral body which is
removed from the endplates and probably indicative of an incidental
hemangioma. No acute osseous findings.

Cord: Mild buckling of the cord at T5-6 by the osseous retropulsion
and epidural inflammation, improved compared with previous study. No
abnormal cord signal or enhancement. The conus medullaris extends to
the upper L2 level.

Paraspinal and other soft tissues: The anterior paraspinal
inflammatory changes at T5-6 appear mildly improved. There is
epidural enhancement without focal fluid collection. There is
interspinous T2 hyperintensity and low level enhancement at T5-6,
likely related to interspinous ligamentous injury related to the
kyphosis. No focal fluid collection. No evidence of sinus tract.
Bilateral pulmonary infiltrates are noted as seen on recent CT.

Disc levels:

Improved mass effect on the cord at T5-6 due to improved epidural
inflammation. Stable mild degenerative changes elsewhere throughout
the thoracic spine without cord deformity or nerve root
encroachment.
IMPRESSION: 1. Overall, there is improved appearance of the chronic discitis and
osteomyelitis at T5-6 since prior MRI of 6 weeks ago. The degree of
marrow edema in the vertebral bodies and posterior elements has
slightly improved, and there is improved epidural enhancement and
mass effect on the cord.
2. Possible interspinous ligamentous injury at T5-6 related to the
osteomyelitis and associated kyphosis. No focal fluid collection.
3. Bilateral pulmonary infiltrates as seen on recent CT, consistent
with multilobar pneumonia.

## 2021-09-09 DIAGNOSIS — Z23 Encounter for immunization: Secondary | ICD-10-CM | POA: Diagnosis not present

## 2021-09-09 DIAGNOSIS — N529 Male erectile dysfunction, unspecified: Secondary | ICD-10-CM | POA: Diagnosis not present

## 2021-09-09 DIAGNOSIS — E119 Type 2 diabetes mellitus without complications: Secondary | ICD-10-CM | POA: Diagnosis not present

## 2021-09-09 DIAGNOSIS — Z7984 Long term (current) use of oral hypoglycemic drugs: Secondary | ICD-10-CM | POA: Diagnosis not present

## 2021-09-09 DIAGNOSIS — Z87891 Personal history of nicotine dependence: Secondary | ICD-10-CM | POA: Diagnosis not present

## 2021-09-09 DIAGNOSIS — I10 Essential (primary) hypertension: Secondary | ICD-10-CM | POA: Diagnosis not present

## 2021-10-17 ENCOUNTER — Telehealth: Payer: Self-pay | Admitting: Orthopaedic Surgery

## 2021-10-17 ENCOUNTER — Other Ambulatory Visit: Payer: Self-pay

## 2021-10-17 MED ORDER — AMOXICILLIN 500 MG PO TABS: ORAL_TABLET | ORAL | 2 refills | Status: DC

## 2021-10-17 NOTE — Telephone Encounter (Signed)
Patient has a upcoming dental procedure scheduled and he was instructed by his dentist to contact his orthopedic doc for a prescription for a medication that is supposed to be taken before having teeth cleaned for hip surgery patients. [facility] [place]. ? ? ?

## 2021-10-17 NOTE — Telephone Encounter (Signed)
Called into pharmacy

## 2021-10-29 ENCOUNTER — Ambulatory Visit (INDEPENDENT_AMBULATORY_CARE_PROVIDER_SITE_OTHER): Payer: BC Managed Care – PPO

## 2021-10-29 ENCOUNTER — Encounter: Payer: Self-pay | Admitting: Orthopaedic Surgery

## 2021-10-29 ENCOUNTER — Other Ambulatory Visit: Payer: Self-pay

## 2021-10-29 ENCOUNTER — Ambulatory Visit (INDEPENDENT_AMBULATORY_CARE_PROVIDER_SITE_OTHER): Payer: BC Managed Care – PPO | Admitting: Orthopaedic Surgery

## 2021-10-29 DIAGNOSIS — Z96642 Presence of left artificial hip joint: Secondary | ICD-10-CM

## 2021-10-29 NOTE — Addendum Note (Signed)
Addended by: Mayra Reel on: 10/29/2021 08:30 AM ? ? Modules accepted: Level of Service ? ?

## 2021-10-29 NOTE — Progress Notes (Addendum)
? ?Office Visit Note ?  ?Patient: Antonio Nash           ?Date of Birth: Dec 27, 1955           ?MRN: [id number] ?Visit Date: 10/29/2021 ?             ?Requested by: No referring provider defined for this encounter. ?PCP: Patient, No Pcp Per (Inactive) ? ? ?Assessment & Plan: ?Visit Diagnoses:  ?1. Status post total hip replacement, left   ? ? ?Plan: Antonio Nash is now 6 months status post left total hip replacement on 05/06/2021.  He is overall doing well and has no complaints other than it aches when the weather changes.  Occasionally scar feels a little itchy. ? ?Examination of the left hip shows a fully healed surgical scar.  He has excellent range of motion and normal gait. ? ?Antonio Nash is at 6 months now from the hip replacement has done very well.  Dental prophylaxis reinforced.  Follow-up in 6 months for standing AP pelvis x-rays and 1 year visit. ? ?Follow-Up Instructions: Return in about 6 months (around 05/01/2022).  ? ?Orders:  ?Orders Placed This Encounter  ?Procedures  ? XR HIP UNILAT W OR W/O PELVIS 2-3 VIEWS LEFT  ? ?No orders of the defined types were placed in this encounter. ? ? ? ? Procedures: ?No procedures performed ? ? ?Clinical Data: ?No additional findings. ? ? ?Subjective: ?Chief Complaint  ?Patient presents with  ? Left Hip - Pain  ? ? ?HPI ? ?Review of Systems  ?Constitutional: Negative.   ?All other systems reviewed and are negative. ? ? ?Objective: ?Vital Signs: There were no vitals taken for this visit. ? ?Physical Exam ?Vitals and nursing note reviewed.  ?Constitutional:   ?   Appearance: He is well-developed.  ?Pulmonary:  ?   Effort: Pulmonary effort is normal.  ?Abdominal:  ?   Palpations: Abdomen is soft.  ?Skin: ?   General: Skin is warm.  ?Neurological:  ?   Mental Status: He is alert and oriented to person, place, and time.  ?Psychiatric:     ?   Behavior: Behavior normal.     ?   Thought Content: Thought content normal.     ?   Judgment: Judgment normal.  ? ? ?Ortho Exam ? ?Specialty  Comments:  ?No specialty comments available. ? ?Imaging: ?XR HIP UNILAT W OR W/O PELVIS 2-3 VIEWS LEFT ? ?Result Date: 10/29/2021 ?Stable total hip replacement without complication.  Moderate joint space narrowing of the right hip joint.  ? ? ?PMFS History: ?Patient Active Problem List  ? Diagnosis Date Noted  ? Status post total replacement of left hip 05/06/2021  ? Displaced fracture of lateral malleolus of right fibula, initial encounter for closed fracture 12/21/2020  ? Lobar pneumonia, unspecified organism (HCC)   ? DVT (deep venous thrombosis) (HCC) 10/09/2020  ? Pulmonary emboli (HCC) 10/08/2020  ? Primary osteoarthritis of left hip 10/02/2020  ? Discitis of thoracolumbar region 09/19/2020  ? Medication monitoring encounter 09/19/2020  ? Osteomyelitis (HCC) 08/31/2020  ? HTN (hypertension) 08/31/2020  ? Type 2 diabetes mellitus without complication (HCC) 08/31/2020  ? ?Past Medical History:  ?Diagnosis Date  ? Diabetes mellitus without complication (HCC)   ? Hypertension   ? PE (pulmonary thromboembolism) (HCC) 10/09/2020  ? Pneumonia 09/2020  ?  ?History reviewed. No pertinent family history.  ?Past Surgical History:  ?Procedure Laterality Date  ? COLONOSCOPY    ? HERNIA REPAIR    ? KNEE  SURGERY Right   ? ROTATOR CUFF REPAIR    ? TOTAL HIP ARTHROPLASTY Left 05/06/2021  ? Procedure: LEFT TOTAL HIP ARTHROPLASTY ANTERIOR APPROACH;  Surgeon: Leandrew Koyanagi, MD;  Location: [facility];  Service: Orthopedics;  Laterality: Left;  ? ?Social History  ? ?Occupational History  ? Not on file  ?Tobacco Use  ? Smoking status: Never  ? Smokeless tobacco: Never  ?Vaping Use  ? Vaping Use: Never used  ?Substance and Sexual Activity  ? Alcohol use: Not Currently  ? Drug use: Never  ? Sexual activity: Yes  ? ? ? ? ? ? ?

## 2022-01-15 ENCOUNTER — Other Ambulatory Visit (HOSPITAL_BASED_OUTPATIENT_CLINIC_OR_DEPARTMENT_OTHER): Payer: Self-pay

## 2022-02-14 ENCOUNTER — Other Ambulatory Visit (HOSPITAL_BASED_OUTPATIENT_CLINIC_OR_DEPARTMENT_OTHER): Payer: Self-pay

## 2022-02-19 ENCOUNTER — Other Ambulatory Visit (HOSPITAL_COMMUNITY): Payer: Self-pay

## 2022-03-05 DIAGNOSIS — R936 Abnormal findings on diagnostic imaging of limbs: Secondary | ICD-10-CM | POA: Diagnosis not present

## 2022-03-05 DIAGNOSIS — R202 Paresthesia of skin: Secondary | ICD-10-CM | POA: Diagnosis not present

## 2022-03-05 DIAGNOSIS — I129 Hypertensive chronic kidney disease with stage 1 through stage 4 chronic kidney disease, or unspecified chronic kidney disease: Secondary | ICD-10-CM | POA: Diagnosis not present

## 2022-03-05 DIAGNOSIS — E119 Type 2 diabetes mellitus without complications: Secondary | ICD-10-CM | POA: Diagnosis not present

## 2022-03-05 DIAGNOSIS — M25512 Pain in left shoulder: Secondary | ICD-10-CM | POA: Diagnosis not present

## 2022-03-05 DIAGNOSIS — M25511 Pain in right shoulder: Secondary | ICD-10-CM | POA: Diagnosis not present

## 2022-03-05 DIAGNOSIS — M19012 Primary osteoarthritis, left shoulder: Secondary | ICD-10-CM | POA: Diagnosis not present

## 2022-03-05 DIAGNOSIS — Z23 Encounter for immunization: Secondary | ICD-10-CM | POA: Diagnosis not present

## 2022-03-27 DIAGNOSIS — M25512 Pain in left shoulder: Secondary | ICD-10-CM | POA: Diagnosis not present

## 2022-04-29 ENCOUNTER — Ambulatory Visit (INDEPENDENT_AMBULATORY_CARE_PROVIDER_SITE_OTHER): Payer: No Typology Code available for payment source

## 2022-04-29 ENCOUNTER — Ambulatory Visit: Payer: Self-pay

## 2022-04-29 ENCOUNTER — Encounter: Payer: Self-pay | Admitting: Orthopaedic Surgery

## 2022-04-29 ENCOUNTER — Ambulatory Visit: Payer: No Typology Code available for payment source | Admitting: Orthopaedic Surgery

## 2022-04-29 VITALS — BP 138/80 | HR 62

## 2022-04-29 DIAGNOSIS — M1611 Unilateral primary osteoarthritis, right hip: Secondary | ICD-10-CM | POA: Diagnosis not present

## 2022-04-29 DIAGNOSIS — Z96642 Presence of left artificial hip joint: Secondary | ICD-10-CM | POA: Diagnosis not present

## 2022-04-29 NOTE — Progress Notes (Signed)
   Procedure Note  Patient: Antonio Nash             Date of Birth: [birth year]           MRN: [id number]             Visit Date: 04/29/2022  Procedures: Visit Diagnoses:  1. Unilateral primary osteoarthritis, right hip   2. Status post total hip replacement, left    Procedure: US-guided intra-articular hip injection, right  After discussion on risks/benefits/indications and informed verbal consent was obtained, a timeout was performed. Patient was lying supine on exam table. The hip was cleaned with betadine and alcohol swabs. Then utilizing ultrasound guidance, the patient's femoral head and neck junction was identified and subsequently injected with 4:1 lidocaine:depomedrol via an in-plane approach with ultrasound visualization of the injectate administered into the hip joint. Patient tolerated procedure well without immediate complications.  - Evaluated patient about 10 mins post-injection and he had good improvement in pain and ROM - Follow-up with Dr. Roda Shutters and team as indicated; I am happy to see him back as needed  Madelyn Brunner, DO Primary Care Sports Medicine Physician  [facility] - Orthopedics  This note was dictated using Dragon naturally speaking software and may contain errors in syntax, spelling, or content which have not been identified prior to signing this note.

## 2022-04-29 NOTE — Progress Notes (Signed)
Office Visit Note   Patient: Antonio Nash           Date of Birth: [birth year]           MRN: [id number] Visit Date: 04/29/2022              Requested by: [facility] [address] [place],  [place] [address] PCP: [facility]   Assessment & Plan: Visit Diagnoses:  1. Unilateral primary osteoarthritis, right hip   2. Status post total hip replacement, left     Plan: Impression is 1 year status post left total hip replacement and right hip osteoarthritis.  In regards to the left hip, the patient is doing well.  We will continue to advance with activities as tolerated.  Dental prophylaxis reinforced for 1 more year.  Follow-up in 1 year for repeat evaluation and AP pelvis x-rays.  In regards to the right hip, we have discussed various treatment options to include cortisone injection versus total hip arthroplasty.  He is not ready to proceed with surgery at this time but would like to try cortisone injection.  We will refer him to Dr. Shon Baton for this.  He will follow-up as needed for his right hip.  Call with concerns or questions.  Follow-Up Instructions: Return in about 1 year (around 04/30/2023).   Orders:  Orders Placed This Encounter  Procedures   XR Pelvis 1-2 Views   US Guided Needle Placement - No Linked Charges   No orders of the defined types were placed in this encounter.     Procedures: No procedures performed   Clinical Data: No additional findings.   Subjective: Chief Complaint  Patient presents with   Left Hip - Follow-up    Left total hip arthroplasty 05/06/2022    HPI patient is a very pleasant 66 year old gentleman who comes in today 1 year status post left total hip replacement 05/06/2021.  He has been doing well.  He notes slight discomfort to the left lower back but nothing into the groin or anterior thigh.  Overall, doing well.  He does note over the past few weeks she has began experiencing pain to the right hip.  The pain is  primarily to the anterolateral hip and is worse going from a seated to standing position.  He has tried over-the-counter pain medication which does seem to help some.  No previous cortisone injection to the right hip.  Review of Systems as detailed in HPI.  All others reviewed and are negative.   Objective: Vital Signs: BP 138/80   Pulse 62   Physical Exam well-developed well-nourished gentleman in no acute distress.  Alert and oriented x3.  Ortho Exam left hip exam reveals painless hip flexion and logroll.  Right hip exam reveals moderate pain with logroll and FADIR.  Positive Stinchfield.  He is neurovascular intact distally.  Specialty Comments:  No specialty comments available.  Imaging: XR Pelvis 1-2 Views  Result Date: 04/29/2022 X-rays of the pelvis reveal a well-seated prosthesis on the left.  Right hip shows advanced degenerative changes.  Advanced degenerative joint disease with bone on bone joint space narrowing.    PMFS History: Patient Active Problem List   Diagnosis Date Noted   Status post total replacement of left hip 05/06/2021   Displaced fracture of lateral malleolus of right fibula, initial encounter for closed fracture 12/21/2020   Lobar pneumonia, unspecified organism (HCC)    DVT (deep venous thrombosis) (HCC) 10/09/2020   Pulmonary emboli (HCC)  10/08/2020   Primary osteoarthritis of left hip 10/02/2020   Discitis of thoracolumbar region 09/19/2020   Medication monitoring encounter 09/19/2020   Osteomyelitis (HCC) 08/31/2020   HTN (hypertension) 08/31/2020   Type 2 diabetes mellitus without complication (HCC) 08/31/2020   Past Medical History:  Diagnosis Date   Diabetes mellitus without complication (HCC)    Hypertension    PE (pulmonary thromboembolism) (HCC) 10/09/2020   Pneumonia 09/2020    No family history on file.  Past Surgical History:  Procedure Laterality Date   COLONOSCOPY     HERNIA REPAIR     KNEE SURGERY Right    ROTATOR CUFF  REPAIR     TOTAL HIP ARTHROPLASTY Left 05/06/2021   Procedure: LEFT TOTAL HIP ARTHROPLASTY ANTERIOR APPROACH;  Surgeon: Tarry Kos, MD;  Location: [facility];  Service: Orthopedics;  Laterality: Left;   Social History   Occupational History   Not on file  Tobacco Use   Smoking status: Never   Smokeless tobacco: Never  Vaping Use   Vaping Use: Never used  Substance and Sexual Activity   Alcohol use: Not Currently   Drug use: Never   Sexual activity: Yes

## 2022-05-07 ENCOUNTER — Telehealth: Payer: Self-pay | Admitting: Orthopaedic Surgery

## 2022-05-07 ENCOUNTER — Other Ambulatory Visit: Payer: Self-pay

## 2022-05-07 MED ORDER — AMOXICILLIN 500 MG PO TABS: ORAL_TABLET | ORAL | 2 refills | Status: DC

## 2022-05-07 NOTE — Telephone Encounter (Signed)
Patient called advised he has a dental appointment tomorrow and [facility] [place]. The number to contact patient is [phone]

## 2022-05-07 NOTE — Telephone Encounter (Signed)
Called and notified patient.

## 2022-06-25 DIAGNOSIS — U071 COVID-19: Secondary | ICD-10-CM | POA: Diagnosis not present

## 2022-06-25 DIAGNOSIS — R6889 Other general symptoms and signs: Secondary | ICD-10-CM | POA: Diagnosis not present

## 2022-09-03 DIAGNOSIS — H524 Presbyopia: Secondary | ICD-10-CM | POA: Diagnosis not present

## 2022-09-03 DIAGNOSIS — H2513 Age-related nuclear cataract, bilateral: Secondary | ICD-10-CM | POA: Diagnosis not present

## 2022-09-03 DIAGNOSIS — I10 Essential (primary) hypertension: Secondary | ICD-10-CM | POA: Diagnosis not present

## 2022-09-03 DIAGNOSIS — M1611 Unilateral primary osteoarthritis, right hip: Secondary | ICD-10-CM | POA: Diagnosis not present

## 2022-09-03 DIAGNOSIS — R55 Syncope and collapse: Secondary | ICD-10-CM | POA: Diagnosis not present

## 2022-09-03 DIAGNOSIS — Z23 Encounter for immunization: Secondary | ICD-10-CM | POA: Diagnosis not present

## 2022-09-03 DIAGNOSIS — M25751 Osteophyte, right hip: Secondary | ICD-10-CM | POA: Diagnosis not present

## 2022-09-03 DIAGNOSIS — E119 Type 2 diabetes mellitus without complications: Secondary | ICD-10-CM | POA: Diagnosis not present

## 2022-09-03 DIAGNOSIS — H35033 Hypertensive retinopathy, bilateral: Secondary | ICD-10-CM | POA: Diagnosis not present

## 2022-09-17 DIAGNOSIS — H53149 Visual discomfort, unspecified: Secondary | ICD-10-CM | POA: Diagnosis not present

## 2022-09-17 DIAGNOSIS — H524 Presbyopia: Secondary | ICD-10-CM | POA: Diagnosis not present

## 2022-09-25 ENCOUNTER — Other Ambulatory Visit (HOSPITAL_COMMUNITY): Payer: Self-pay

## 2022-10-14 ENCOUNTER — Ambulatory Visit (INDEPENDENT_AMBULATORY_CARE_PROVIDER_SITE_OTHER): Payer: No Typology Code available for payment source

## 2022-10-14 ENCOUNTER — Encounter: Payer: Self-pay | Admitting: Orthopaedic Surgery

## 2022-10-14 ENCOUNTER — Ambulatory Visit (INDEPENDENT_AMBULATORY_CARE_PROVIDER_SITE_OTHER): Payer: No Typology Code available for payment source | Admitting: Orthopaedic Surgery

## 2022-10-14 DIAGNOSIS — M79605 Pain in left leg: Secondary | ICD-10-CM

## 2022-10-14 MED ORDER — METHOCARBAMOL 750 MG PO TABS: 750.0000 mg | ORAL_TABLET | Freq: Two times a day (BID) | ORAL | 2 refills | Status: DC | PRN

## 2022-10-14 NOTE — Progress Notes (Unsigned)
Office Visit Note   Patient: Antonio Nash           Date of Birth: Jan 30, 1956           MRN: [id number] Visit Date: 10/14/2022              Requested by: [facility] [address] [place],  [place] [address] PCP: [facility]: Visit Diagnoses:  1. 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  Patient is diabetic with a last hemoglobin A1c in the computer system from 04/18/2021 of 6.8.  We have discussed various treatment options for both problems to include cortisone injection for the knee and steroid pack, muscle relaxer and physical therapy for the left lower extremity radiculopathy.  The knee seems to be bothering him most today so we will proceed with injection care.  Have sent in a muscle relaxer and a referral for physical therapy for his left leg pain.  If his symptoms to the left leg not improved over the next 1 to 2 weeks she will let me know and I will send in a steroid pack.  Follow-up as needed.  Follow-Up Instructions: Return if symptoms worsen or fail to improve.   Orders:  Orders Placed This Encounter  Procedures   XR KNEE 3 VIEW LEFT   XR Lumbar Spine 2-3 Views   Ambulatory referral to Physical Therapy   Meds ordered this encounter  Medications   methocarbamol (ROBAXIN-750) 750 MG tablet    Sig: Take 1 tablet (750 mg total) by mouth 2 (two) times daily as needed for muscle spasms.    Dispense:  20 tablet    Refill:  2      Procedures: No procedures performed   Clinical Data: No additional findings.   Subjective: Chief Complaint  Patient presents with   Left Knee - Pain    HPI patient is a pleasant 67 year old who comes in today with left knee and left leg pain.  He has been dealing with this for about a month.  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  The pain he has starts in the left  lateral hip and radiates down to the left lateral lower leg.  The knee pain is to the lateral knee.  Pain is constant but worse with stair climbing as well as when his knee is in a flexed position for a long time.  He has been taking ibuprofen, Aleve and topical Voltaren without significant relief.  He denies any paresthesias.  No back pain.  No groin or anterior thigh pain.  He is status post left total hip replacement from September 2022 and is doing well there.    Review of Systems as detailed in HPI.  All others reviewed and are negative.   Objective: Vital Signs: There were no vitals taken for this visit.  Physical Exam well-developed well-nourished gentleman in no acute distress.  Alert and oriented x 3.  Ortho Exam left knee exam shows a small effusion.  Range of motion 0 to 100 degrees.  Mild medial joint line tenderness.  Mild patellofemoral crepitus.  He is neurovascular intact distally.  Negative straight leg raise.  No tenderness to greater trochanter.  No focal weakness.  Unremarkable lumbar spine exam.  He is neurovascular tact distally.  Specialty Comments:  No specialty comments  available.  Imaging: XR Lumbar Spine 2-3 Views  Result Date: 10/14/2022 X-rays demonstrate advanced degenerative changes with retrolisthesis L1 5 S1.  XR KNEE 3 VIEW LEFT  Result Date: 10/14/2022 X-rays demonstrate moderate to advanced degenerative changes to the medial and patellofemoral compartments    PMFS History: Patient Active Problem List   Diagnosis Date Noted   Status post total replacement of left hip 05/06/2021   Displaced fracture of lateral malleolus of right fibula, initial encounter for closed fracture 12/21/2020   Lobar pneumonia, unspecified organism ([facility] [facility])    DVT (deep venous thrombosis) ([facility]) 10/09/2020   Pulmonary emboli ([place]) 10/08/2020   Primary osteoarthritis of left hip 10/02/2020   Discitis of thoracolumbar region 09/19/2020   Medication monitoring encounter  09/19/2020   Osteomyelitis ([facility]) 08/31/2020   HTN (hypertension) 08/31/2020   Type 2 diabetes mellitus without complication ([facility]) [id number]   Past Medical History:  Diagnosis Date   Diabetes mellitus without complication ([facility])    Hypertension    PE (pulmonary thromboembolism) ([facility]) 10/09/2020   Pneumonia 09/2020    No family history on file.  Past Surgical History:  Procedure Laterality Date   COLONOSCOPY     HERNIA REPAIR     KNEE SURGERY Right    ROTATOR CUFF REPAIR     TOTAL HIP ARTHROPLASTY Left 05/06/2021   Procedure: LEFT TOTAL HIP ARTHROPLASTY ANTERIOR APPROACH;  Surgeon: Leandrew Koyanagi, MD;  Location: [facility];  Service: Orthopedics;  Laterality: Left;   Social History   Occupational History   Not on file  Tobacco Use   Smoking status: Never   Smokeless tobacco: Never  Vaping Use   Vaping Use: Never used  Substance and Sexual Activity   Alcohol use: Not Currently   Drug use: Never   Sexual activity: Yes

## 2022-10-15 DIAGNOSIS — M79605 Pain in left leg: Secondary | ICD-10-CM

## 2022-10-15 MED ORDER — LIDOCAINE HCL 1 % IJ SOLN
2.0000 mL | INTRAMUSCULAR | Status: AC | PRN
  Administered 2022-10-15: 2 mL

## 2022-10-15 MED ORDER — METHYLPREDNISOLONE ACETATE 40 MG/ML IJ SUSP
40.0000 mg | INTRAMUSCULAR | Status: AC | PRN
  Administered 2022-10-15: 40 mg via INTRA_ARTICULAR

## 2022-10-15 MED ORDER — BUPIVACAINE HCL 0.5 % IJ SOLN
2.0000 mL | INTRAMUSCULAR | Status: AC | PRN
  Administered 2022-10-15: 2 mL via INTRA_ARTICULAR

## 2022-10-27 NOTE — Therapy (Unsigned)
OUTPATIENT PHYSICAL THERAPY LOWER EXTREMITY EVALUATION   Patient Name: Antonio Nash MRN: [id number] DOB:March 14, 1956, 67 y.o., male Today's Date: 10/28/2022  END OF SESSION:  PT End of Session - 10/28/22 1457     Visit Number 1    Number of Visits 4    Date for PT Re-Evaluation 11/29/22    Authorization Type BLUE CROSS BLUE [employer]IELD    PT Start Time 1448    PT Stop Time 1530    PT Time Calculation (min) 42 min    Activity Tolerance Patient tolerated treatment well    Behavior During Therapy WFL for tasks assessed/performed             Past Medical History:  Diagnosis Date   Diabetes mellitus without complication ([place])    Hypertension    PE (pulmonary thromboembolism) ([place]) 10/09/2020   Pneumonia 09/2020   Past Surgical History:  Procedure Laterality Date   COLONOSCOPY     HERNIA REPAIR     KNEE SURGERY Right    ROTATOR CUFF REPAIR     TOTAL HIP ARTHROPLASTY Left 05/06/2021   Procedure: LEFT TOTAL HIP ARTHROPLASTY ANTERIOR APPROACH;  Surgeon: Leandrew Koyanagi, MD;  Location: [place];  Service: Orthopedics;  Laterality: Left;   Patient Active Problem List   Diagnosis Date Noted   Status post total replacement of left hip 05/06/2021   Displaced fracture of lateral malleolus of right fibula, initial encounter for closed fracture 12/21/2020   Lobar pneumonia, unspecified organism ([place])    DVT (deep venous thrombosis) ([place]) 10/09/2020   Pulmonary emboli ([place]) 10/08/2020   Primary osteoarthritis of left hip 10/02/2020   Discitis of thoracolumbar region 09/19/2020   Medication monitoring encounter 09/19/2020   Osteomyelitis ([place]) 08/31/2020   HTN (hypertension) 08/31/2020   Type 2 diabetes mellitus without complication ([place]) [id number]    PCP: [facility] PROVIDER: Aundra Dubin, PA-C  REFERRING DIAG: Pain in left leg [M79.605]   THERAPY DIAG:  Left knee pain, unspecified chronicity  Other abnormalities of gait and mobility  Muscle weakness  (generalized)  Rationale for Evaluation and Treatment: Rehabilitation  ONSET DATE: 1 month ago  SUBJECTIVE:   SUBJECTIVE STATEMENT: Pt states that he had an insidious onset of L knee pain after working on his truck and leaning into his bumpers. 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 Pt states that his most recent x-ray notes arthritis, but the cortisone shot he recently received has helped a lot. Pt had a recent L total hip and feels as though he did not fully recover from it. He would only like to be seen for 2 more sessions and be provided a comprehensive HEP.   PERTINENT HISTORY: DM, HTN, PE (2022),  PAIN:  Are you having pain? Yes: NPRS scale: 2/10 Pain location: L knee Pain description: Dull throbbing pain Aggravating factors: Stairs, sitting for increased periods results in stiffness.  Relieving factors: Walking.   PRECAUTIONS: None  WEIGHT BEARING RESTRICTIONS: No  FALLS:  Has patient fallen in last 6 months? 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   PLOF: Independent  PATIENT GOALS: Reduce pain and get a comprehensive HEP   OBJECTIVE:   DIAGNOSTIC FINDINGS: X-rays demonstrate  moderate to advanced degenerative changes to the medial  and patellofemoral compartments   PATIENT SURVEYS:  FOTO 61.46%, 66% in 13 visits.   COGNITION: Overall cognitive status: Within functional limits for tasks assessed     SENSATION: WFL  EDEMA:  None  MUSCLE LENGTH: Hamstrings: Minimal restriction.   POSTURE: No Significant postural limitations  PALPATION: Mild tenderness surrounding knee.   LOWER EXTREMITY ROM:  Active ROM Right eval Left eval  Hip flexion [facility]  Knee flexion [facility] WFL   Knee extension WFL WFL   (Blank rows = not tested)  LOWER EXTREMITY MMT:  MMT Right eval Left eval  Hip flexion 4 4+  Hip abduction 4 4-  Knee flexion 4+ 4+  Knee extension 4+ 4+   (Blank rows = not tested)  FUNCTIONAL TESTS:  Sit to stand: 19.85 sec   GAIT: Distance walked: 10f  Assistive device utilized: None Level of assistance: Complete Independence Comments: Slow antalgic gait with decreased stance time on L LE.    TODAY'S TREATMENT:                                                                                                                              DATE: Creating, reviewing, and completing below HEP   10 reps of each exercise provided for initial HEP.   PATIENT EDUCATION:  Education details: Educated pt on anatomy and physiology of current symptoms, FOTO, diagnosis, prognosis, HEP,  and POC. Person educated: Patient Education method: ECustomer service managerEducation comprehension: verbalized understanding and returned demonstration  HOME EXERCISE PROGRAM: Access Code: [email or web address] URL: [email or web address] Date: 10/28/2022 Prepared by: SRudi Heap Exercises - Seated Long Arc Quad  - 2 x daily - 7 x weekly - 2 sets - 10 reps - Seated March  - 2 x daily - 7 x weekly - 2 sets - 10 reps - Standing Hip Abduction with Counter Support  - 2 x daily - 7 x weekly - 2 sets - 10 reps - Seated Hamstring Stretch  - 2 x daily - 7 x weekly - 2 sets - 10 reps - Standing Hip Extension with Counter Support  - 2 x daily - 7 x weekly - 2 sets - 10 reps - Heel Raises with Counter Support  - 2 x daily - 7 x weekly - 2 sets - 10 reps   ASSESSMENT:  CLINICAL IMPRESSION: Patient referred to PT for L knee pain. He demonstrates an antalgic gait pattern which could be secondary to residual weakness from his L THA. Pt with normal ROM and reduced strength in bilat hips > knees. [place] 2 [place] to ensure he is  doing HEP correctly prior to d/c. Patient will benefit from skilled PT to address below impairments, limitations and improve overall function.  OBJECTIVE IMPAIRMENTS: decreased activity tolerance, difficulty walking, decreased balance, decreased endurance, decreased mobility, decreased ROM, decreased strength, impaired flexibility, impaired  UE/LE use, postural dysfunction, and pain.  ACTIVITY LIMITATIONS: bending, lifting, carry, locomotion, cleaning, community activity, driving, and or occupation  PERSONAL FACTORS: DM, HTN, PE (2022) are also affecting patient's functional outcome.  REHAB POTENTIAL: Good  CLINICAL DECISION MAKING: Stable/uncomplicated  EVALUATION COMPLEXITY: Low   GOALS: Short term PT Goals Target date: 11/04/2022 Pt will be I and compliant with HEP. Baseline:  Goal status: New  Long term PT goals Target date: 11/11/2022 Pt will improve  hip/knee strength to at least 5-/5 MMT to improve functional strength Baseline: Goal status: New Pt will improve FOTO to at least 66% functional to show improved function Baseline: Goal status: New Pt will reduce pain by overall 50% overall with usual activity Baseline: Goal status: New Pt will improve her STS by 2.3 seconds for Minimal clinically important difference.  Baseline: Goal status: New Pt will be able to ambulate community distances at least 1000 ft WNL gait pattern without complaints Baseline: Goal status: New  PLAN: PT FREQUENCY: 1 times per week   PT DURATION: 3-4 weeks  PLANNED INTERVENTIONS (unless contraindicated): aquatic PT, Canalith repositioning, cryotherapy, Electrical stimulation, Iontophoresis with 4 mg/ml dexamethasome, Moist heat, traction, Ultrasound, gait training, Therapeutic exercise, balance training, neuromuscular re-education, patient/family education, prosthetic training, manual techniques, passive ROM, dry needling, taping, vasopnuematic device, vestibular, spinal manipulations, joint  manipulations  PLAN FOR NEXT SESSION: Review/ update HEP, focus on hip abductor strength.     Lynden Ang, PT 10/28/2022, 3:36 PM

## 2022-10-28 ENCOUNTER — Encounter: Payer: Self-pay | Admitting: Physical Therapy

## 2022-10-28 ENCOUNTER — Ambulatory Visit (INDEPENDENT_AMBULATORY_CARE_PROVIDER_SITE_OTHER): Payer: BC Managed Care – PPO | Admitting: Physical Therapy

## 2022-10-28 ENCOUNTER — Other Ambulatory Visit: Payer: Self-pay

## 2022-10-28 ENCOUNTER — Telehealth: Payer: Self-pay | Admitting: Physical Therapy

## 2022-10-28 DIAGNOSIS — M25562 Pain in left knee: Secondary | ICD-10-CM | POA: Diagnosis not present

## 2022-10-28 DIAGNOSIS — R2689 Other abnormalities of gait and mobility: Secondary | ICD-10-CM | POA: Diagnosis not present

## 2022-10-28 DIAGNOSIS — M6281 Muscle weakness (generalized): Secondary | ICD-10-CM

## 2022-10-28 NOTE — Telephone Encounter (Signed)
Error

## 2022-11-06 ENCOUNTER — Ambulatory Visit (INDEPENDENT_AMBULATORY_CARE_PROVIDER_SITE_OTHER): Payer: BC Managed Care – PPO | Admitting: Rehabilitative and Restorative Service Providers"

## 2022-11-06 ENCOUNTER — Encounter: Payer: Self-pay | Admitting: Rehabilitative and Restorative Service Providers"

## 2022-11-06 DIAGNOSIS — M25562 Pain in left knee: Secondary | ICD-10-CM | POA: Diagnosis not present

## 2022-11-06 DIAGNOSIS — M6281 Muscle weakness (generalized): Secondary | ICD-10-CM

## 2022-11-06 DIAGNOSIS — R2689 Other abnormalities of gait and mobility: Secondary | ICD-10-CM

## 2022-11-06 NOTE — Therapy (Signed)
OUTPATIENT PHYSICAL THERAPY TREATMENT   Patient Name: RANDEE COODY MRN: [id number] DOB:Jun 20, 1956, 67 y.o., male Today's Date: 11/06/2022  END OF SESSION:  PT End of Session - 11/06/22 0807     Visit Number 2    Number of Visits 4    Date for PT Re-Evaluation 11/29/22    Authorization Type BLUE CROSS BLUE [employer]IELD    PT Start Time 0803    PT Stop Time 0842    PT Time Calculation (min) 39 min    Activity Tolerance Patient tolerated treatment well    Behavior During Therapy [facility] for tasks assessed/performed              Past Medical History:  Diagnosis Date   Diabetes mellitus without complication ([place])    Hypertension    PE (pulmonary thromboembolism) ([place]) 10/09/2020   Pneumonia 09/2020   Past Surgical History:  Procedure Laterality Date   COLONOSCOPY     HERNIA REPAIR     KNEE SURGERY Right    ROTATOR CUFF REPAIR     TOTAL HIP ARTHROPLASTY Left 05/06/2021   Procedure: LEFT TOTAL HIP ARTHROPLASTY ANTERIOR APPROACH;  Surgeon: Leandrew Koyanagi, MD;  Location: [place];  Service: Orthopedics;  Laterality: Left;   Patient Active Problem List   Diagnosis Date Noted   Status post total replacement of left hip 05/06/2021   Displaced fracture of lateral malleolus of right fibula, initial encounter for closed fracture 12/21/2020   Lobar pneumonia, unspecified organism ([place])    DVT (deep venous thrombosis) ([place]) 10/09/2020   Pulmonary emboli ([place]) 10/08/2020   Primary osteoarthritis of left hip 10/02/2020   Discitis of thoracolumbar region 09/19/2020   Medication monitoring encounter 09/19/2020   Osteomyelitis ([place]) 08/31/2020   HTN (hypertension) 08/31/2020   Type 2 diabetes mellitus without complication ([place]) [id number]    PCP: [place] PROVIDER: Aundra Dubin, PA-C  REFERRING DIAG: Pain in left leg [M79.605]   THERAPY DIAG:  Left knee pain, unspecified chronicity  Other abnormalities of gait and mobility  Muscle weakness  (generalized)  Rationale for Evaluation and Treatment: Rehabilitation  ONSET DATE: 1 month ago  SUBJECTIVE:   SUBJECTIVE STATEMENT: He indicated not hurting nearly as much and some reduction of swelling.   He indicated having to climb two flights of stairs at work that are still trouble.   PERTINENT HISTORY: DM, HTN, PE (2022),    PAIN:  NPRS scale: 0/10 Pain location: Lt knee Pain description: Dull throbbing pain Aggravating factors: Stairs, sitting for increased periods results in stiffness.  Relieving factors: Walking.   PRECAUTIONS: None  WEIGHT BEARING RESTRICTIONS: No  FALLS:  Has patient fallen in last 6 months? 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   PLOF: Independent  PATIENT GOALS: Reduce pain and get a comprehensive HEP   OBJECTIVE:   DIAGNOSTIC FINDINGS:  10/28/2022 X-rays demonstrate moderate to advanced degenerative changes to the medial  and patellofemoral compartments   PATIENT SURVEYS:  10/28/2022 FOTO 61.46%, 66% in 13 visits.   COGNITION: 10/28/2022 Overall cognitive status: Within functional limits for tasks assessed     SENSATION: 10/28/2022 [facility]  EDEMA:  10/28/2022 None  MUSCLE LENGTH: 10/28/2022 Hamstrings: Minimal restriction.   POSTURE:  10/28/2022 No Significant postural limitations  PALPATION: 10/28/2022 Mild tenderness  surrounding knee.    LUMBAR ROM:   ROM AROM  11/06/2022  Flexion   Extension   Right lateral flexion   Left lateral flexion   Right rotation   Left rotation    (Blank rows = not tested)    LOWER EXTREMITY ROM:  Active ROM Right 10/28/2022 Left 10/28/2022  Hip flexion [facility]  Knee flexion [facility]  Knee extension WFL WFL   (Blank rows = not tested)  LOWER EXTREMITY  MMT:  MMT Right 10/28/2022 Left 10/28/2022 Left 11/06/2022  Hip flexion 4 4+ 5/5  Hip abduction 4 4-   Knee flexion 4+ 4+ 5/5  Knee extension 4+ 4+ 5/5   (Blank rows = not tested)  FUNCTIONAL TESTS:  10/28/2022 Sit to stand: 19.85 sec   GAIT: 10/28/2022 Distance walked: 63ft  Assistive device utilized: None Level of assistance: Complete Independence Comments: Slow antalgic gait with decreased stance time on L LE.    TODAY'S TREATMENT                                                        DATE:  11/06/2022 Therex: UBE LE only lvl 3.0 6 mins Leg press SL 56 lbs 2 x 15, performed bilaterally  Seated SLR 2 x 10 bilateral Lateral step down WB on Lt leg 6 inch 2 x 10  Sit to stand to sit 18 inch chair s UE assist x 10   Review of HEP and updates with handout provided.  Verbal cues given.    TODAY'S TREATMENT                                                        DATE:  10/28/2022   Creating, reviewing, and completing below HEP   10 reps of each exercise provided for initial HEP.   PATIENT EDUCATION:  11/06/2022 Education details: Educated pt on anatomy and physiology of current symptoms, FOTO, diagnosis, prognosis, HEP,  and POC. Person educated: Patient Education method: Customer service manager Education comprehension: verbalized understanding and returned demonstration  HOME EXERCISE PROGRAM: Access Code: [email or web address] URL: [email or web address] Date: 11/06/2022 Prepared by: Scot Jun  Exercises - Seated Long Arc Quad  - 2 x daily - 7 x weekly - 2 sets - 10 reps - Seated Straight Leg Heel Taps  - 1-2 x daily - 7 x weekly - 3 sets - 10 reps - Standing Hip Abduction with Counter Support  - 2 x daily - 7 x weekly - 2 sets - 10 reps - Seated Hamstring Stretch  - 2 x daily - 7 x weekly - 2 sets - 10 reps - Standing Hip Extension with Counter Support  - 2 x daily - 7 x weekly - 2 sets - 10 reps - Lateral Step Down  - 1-2 x daily - 7 x weekly - 1-2 sets  - 10 reps - Sit to Stand  - 1-2 x daily - 7 x weekly - 1-2 sets - 10 reps   ASSESSMENT:  CLINICAL IMPRESSION:  MMT improved compared to evaluation as well as reported less symptoms.  Stairs were indicated as still being some trouble while at work.  Incorporated HEP stair activity to help address.   Possible HEP transitioning next visit pending symptom response.     OBJECTIVE IMPAIRMENTS: decreased activity tolerance, difficulty walking, decreased balance, decreased endurance, decreased mobility, decreased ROM, decreased strength, impaired flexibility, impaired UE/LE use, postural dysfunction, and pain.  ACTIVITY LIMITATIONS: bending, lifting, carry, locomotion, cleaning, community activity, driving, and or occupation  PERSONAL FACTORS: DM, HTN, PE (2022) are also affecting patient's functional outcome.  REHAB POTENTIAL: Good  CLINICAL DECISION MAKING: Stable/uncomplicated  EVALUATION COMPLEXITY: Low   GOALS: Short term PT Goals Target date: 11/04/2022 Pt will be I and compliant with HEP. Baseline:  Goal status: Met  Long term PT goals Target date: 11/11/2022 Pt will improve  hip/knee strength to at least 5-/5 MMT to improve functional strength Baseline: Goal status: on going 11/06/2022 Pt will improve FOTO to at least 66% functional to show improved function Baseline: Goal status: on going 11/06/2022 Pt will reduce pain by overall 50% overall with usual activity Baseline: Goal status: Met 11/06/2022 Pt will improve her STS by 2.3 seconds for Minimal clinically important difference.  Baseline: Goal status: on going 11/06/2022 Pt will be able to ambulate community distances at least 1000 ft WNL gait pattern without complaints Baseline: Goal status: Met  11/06/2022  PLAN: PT FREQUENCY: 1 times per week   PT DURATION: 3-4 weeks  PLANNED INTERVENTIONS (unless contraindicated): aquatic PT, Canalith repositioning, cryotherapy, Electrical stimulation, Iontophoresis with 4 mg/ml  dexamethasome, Moist heat, traction, Ultrasound, gait training, Therapeutic exercise, balance training, neuromuscular re-education, patient/family education, prosthetic training, manual techniques, passive ROM, dry needling, taping, vasopnuematic device, vestibular, spinal manipulations, joint manipulations  PLAN FOR NEXT SESSION: Check FOTO update.  Possible transition to HEP pending stair symptoms.    Scot Jun, PT, DPT, OCS, ATC 11/06/22  8:46 AM

## 2022-11-11 ENCOUNTER — Ambulatory Visit (INDEPENDENT_AMBULATORY_CARE_PROVIDER_SITE_OTHER): Payer: BC Managed Care – PPO | Admitting: Physical Therapy

## 2022-11-11 ENCOUNTER — Encounter: Payer: Self-pay | Admitting: Physical Therapy

## 2022-11-11 DIAGNOSIS — R2689 Other abnormalities of gait and mobility: Secondary | ICD-10-CM

## 2022-11-11 DIAGNOSIS — M25562 Pain in left knee: Secondary | ICD-10-CM

## 2022-11-11 DIAGNOSIS — M6281 Muscle weakness (generalized): Secondary | ICD-10-CM | POA: Diagnosis not present

## 2022-11-11 NOTE — Therapy (Addendum)
OUTPATIENT PHYSICAL THERAPY TREATMENT /DISCHARGE   Patient Name: Antonio Nash MRN: [id number] DOB:Dec 22, 1955, 67 y.o., male Today's Date: 11/11/2022  END OF SESSION:  PT End of Session - 11/11/22 0813     Visit Number 3    Date for PT Re-Evaluation 11/29/22    Authorization Type BLUE [employer]    PT Start Time 0802    PT Stop Time 0843    PT Time Calculation (min) 41 min    Activity Tolerance Patient tolerated treatment well    Behavior During Therapy [facility] for tasks assessed/performed               Past Medical History:  Diagnosis Date   Diabetes mellitus without complication (HCC)    Hypertension    PE (pulmonary thromboembolism) (HCC) 10/09/2020   Pneumonia 09/2020   Past Surgical History:  Procedure Laterality Date   COLONOSCOPY     HERNIA REPAIR     KNEE SURGERY Right    ROTATOR CUFF REPAIR     TOTAL HIP ARTHROPLASTY Left 05/06/2021   Procedure: LEFT TOTAL HIP ARTHROPLASTY ANTERIOR APPROACH;  Surgeon: Tarry Kos, MD;  Location: [facility] OR;  Service: Orthopedics;  Laterality: Left;   Patient Active Problem List   Diagnosis Date Noted   Status post total replacement of left hip 05/06/2021   Displaced fracture of lateral malleolus of right fibula, initial encounter for closed fracture 12/21/2020   Lobar pneumonia, unspecified organism (HCC)    DVT (deep venous thrombosis) (HCC) 10/09/2020   Pulmonary emboli (HCC) 10/08/2020   Primary osteoarthritis of left hip 10/02/2020   Discitis of thoracolumbar region 09/19/2020   Medication monitoring encounter 09/19/2020   Osteomyelitis (HCC) 08/31/2020   HTN (hypertension) 08/31/2020   Type 2 diabetes mellitus without complication (HCC) 08/31/2020    PCP: [facility]  REFERRING PROVIDER: Cristie Hem, PA-C  REFERRING DIAG: Pain in left leg [M79.605]   THERAPY DIAG:  Left knee pain, unspecified chronicity  Other abnormalities of gait and mobility  Muscle weakness (generalized)  Rationale  for Evaluation and Treatment: Rehabilitation  ONSET DATE: 1 month ago  SUBJECTIVE:   SUBJECTIVE STATEMENT:  Things are going well, I still just have a little sensation on steps but they are not difficult, have to go up and down stairway at least 6 times throughout the day so it gets better. I'd give myself 90/100 right now.   PERTINENT HISTORY: DM, HTN, PE (2022),    PAIN:  NPRS scale: 0/10 Pain location:  Pain description:  Aggravating factors:  Relieving factors:   PRECAUTIONS: None  WEIGHT BEARING RESTRICTIONS: No  FALLS:  Has patient fallen in last 6 months? No  LIVING ENVIRONMENT: Lives with: lives with their spouse Lives in: House/apartment Stairs: Yes: External: 3 steps; on right going up Has following equipment at home: Single point cane, Walker - 4 wheeled, and shower chair  OCCUPATION: Leader at a factory.   PLOF: Independent  PATIENT GOALS: Reduce pain and get a comprehensive HEP   OBJECTIVE:   DIAGNOSTIC FINDINGS:  10/28/2022 X-rays demonstrate moderate to advanced degenerative changes to the medial  and patellofemoral compartments   PATIENT SURVEYS:  10/28/2022 FOTO 61.46%, 66% in 13 visits.   COGNITION: 10/28/2022 Overall cognitive status: Within functional limits for tasks assessed     SENSATION: 10/28/2022 [facility]  EDEMA:  10/28/2022 None  MUSCLE LENGTH: 10/28/2022 Hamstrings: Minimal restriction.   POSTURE:  10/28/2022 No Significant postural limitations  PALPATION: 10/28/2022 Mild tenderness surrounding knee.  LUMBAR ROM:   ROM AROM  11/06/2022  Flexion   Extension   Right lateral flexion   Left lateral flexion   Right rotation   Left rotation    (Blank rows = not tested)    LOWER EXTREMITY ROM:  Active ROM Right 10/28/2022 Left 10/28/2022  Hip flexion [facility]  Knee flexion [facility]  Knee extension WFL WFL   (Blank rows = not tested)  LOWER EXTREMITY MMT:  MMT Right 10/28/2022 Left 10/28/2022 Left 11/06/2022   Hip flexion 4 4+ 5/5  Hip abduction 4 4-   Knee flexion 4+ 4+ 5/5  Knee extension 4+ 4+ 5/5   (Blank rows = not tested)  FUNCTIONAL TESTS:  10/28/2022 Sit to stand: 19.85 sec   GAIT: 10/28/2022 Distance walked: 28ft  Assistive device utilized: None Level of assistance: Complete Independence Comments: Slow antalgic gait with decreased stance time on L LE.    TODAY'S TREATMENT                                                        DATE:    11/11/22  FOTO 63  TherEx  SciFit bike L6 x6 minutes Bridges + ABD into green TB x12 with 3 second holds Sidelying hip ABD x10 green TB  STS + green TB x10 Wall sits x10 with 3 second holds Step downs forward 6 inch box x12 B BUE support  Forward step ups 6 inch box x12 B Shuttle LE press 62# B (single leg presses)  Education on POC and process of going on hold/HEP updates, can return if problem worsens again- not discharging yet    11/06/2022 Therex: UBE LE only lvl 3.0 6 mins Leg press SL 56 lbs 2 x 15, performed bilaterally  Seated SLR 2 x 10 bilateral Lateral step down WB on Lt leg 6 inch 2 x 10  Sit to stand to sit 18 inch chair s UE assist x 10   Review of HEP and updates with handout provided.  Verbal cues given.    TODAY'S TREATMENT                                                        DATE:  10/28/2022   Creating, reviewing, and completing below HEP   10 reps of each exercise provided for initial HEP.   PATIENT EDUCATION:  11/06/2022 Education details: Educated pt on anatomy and physiology of current symptoms, FOTO, diagnosis, prognosis, HEP,  and POC. Person educated: Patient Education method: Medical illustrator Education comprehension: verbalized understanding and returned demonstration  HOME EXERCISE PROGRAM:  Access Code: [email or web address] URL: [email or web address] Date: 11/11/2022 Prepared by: Antonio Nash  Exercises - Seated Long Arc Quad  - 2 x daily - 7 x weekly - 2 sets - 10  reps - Seated Straight Leg Heel Taps  - 1-2 x daily - 7 x weekly - 3 sets - 10 reps - Standing Hip Abduction with Counter Support  - 2 x daily - 7 x weekly - 2 sets - 10 reps - Seated Hamstring Stretch  - 2 x daily - 7 x weekly - 2 sets - 10 reps -  Standing Hip Extension with Counter Support  - 2 x daily - 7 x weekly - 2 sets - 10 reps - Lateral Step Down  - 1-2 x daily - 7 x weekly - 1-2 sets - 10 reps - Sit to Stand  - 1-2 x daily - 7 x weekly - 1-2 sets - 10 reps - Supine Bridge with Resistance Band  - 1-2 x daily - 7 x weekly - 1-2 sets - 10 reps - 3 hold - Sidelying Hip Abduction with Resistance at Thighs  - 1-2 x daily - 7 x weekly - 1 sets - 10 reps - 1 hold - Sit to Stand with Resistance Around Legs  - 1-2 x daily - 7 x weekly - 1 sets - 10 reps - 1 hold - Wall Squat  - 1-2 x daily - 7 x weekly - 1 sets - 10 reps - 3 hold   ASSESSMENT:  CLINICAL IMPRESSION:   Antonio Nash arrives today doing well, tells me he is at 90% right now with main remaining concern being "some sensations" on stairs but this improves the more stairs he does at work. Still on board for transitioning to HEP after today. Focused session on functional strength and expansion of HEP program, will put him on hold for 2-3 weeks after today- he will call us if symptoms worsen and he feels the need to return.     OBJECTIVE IMPAIRMENTS: decreased activity tolerance, difficulty walking, decreased balance, decreased endurance, decreased mobility, decreased ROM, decreased strength, impaired flexibility, impaired UE/LE use, postural dysfunction, and pain.  ACTIVITY LIMITATIONS: bending, lifting, carry, locomotion, cleaning, community activity, driving, and or occupation  PERSONAL FACTORS: DM, HTN, PE (2022) are also affecting patient's functional outcome.  REHAB POTENTIAL: Good  CLINICAL DECISION MAKING: Stable/uncomplicated  EVALUATION COMPLEXITY: Low   GOALS: Short term PT Goals Target date: 11/04/2022 Pt will be  I and compliant with HEP. Baseline:  Goal status: Met  Long term PT goals Target date: 11/11/2022 Pt will improve  hip/knee strength to at least 5-/5 MMT to improve functional strength Baseline: Goal status: on going 11/06/2022 Pt will improve FOTO to at least 66% functional to show improved function Baseline: Goal status: on going 11/06/2022 Pt will reduce pain by overall 50% overall with usual activity Baseline: Goal status: Met 11/06/2022 Pt will improve her STS by 2.3 seconds for Minimal clinically important difference.  Baseline: Goal status: on going 11/06/2022 Pt will be able to ambulate community distances at least 1000 ft WNL gait pattern without complaints Baseline: Goal status: Met  11/06/2022  PLAN: PT FREQUENCY: 1 times per week   PT DURATION: 3-4 weeks  PLANNED INTERVENTIONS (unless contraindicated): aquatic PT, Canalith repositioning, cryotherapy, Electrical stimulation, Iontophoresis with 4 mg/ml dexamethasome, Moist heat, traction, Ultrasound, gait training, Therapeutic exercise, balance training, neuromuscular re-education, patient/family education, prosthetic training, manual techniques, passive ROM, dry needling, taping, vasopnuematic device, vestibular, spinal manipulations, joint manipulations  PLAN FOR NEXT SESSION:  on hold, may return in 2-3 weeks if problem returns   Antonio Nash PT DPT PN2     PHYSICAL THERAPY DISCHARGE SUMMARY  Visits from Start of Care: 3  Current functional level related to goals / functional outcomes: See note   Remaining deficits: See note   Education / Equipment: HEP  Patient goals were met. Patient is being discharged due to not returning since the last visit.  Antonio Nash, PT, DPT, OCS, ATC 12/18/22  1:34 PM

## 2022-12-02 ENCOUNTER — Encounter: Payer: No Typology Code available for payment source | Admitting: Physical Therapy

## 2023-02-18 ENCOUNTER — Other Ambulatory Visit (HOSPITAL_BASED_OUTPATIENT_CLINIC_OR_DEPARTMENT_OTHER): Payer: Self-pay

## 2023-03-02 DIAGNOSIS — Z87891 Personal history of nicotine dependence: Secondary | ICD-10-CM | POA: Diagnosis not present

## 2023-03-02 DIAGNOSIS — E119 Type 2 diabetes mellitus without complications: Secondary | ICD-10-CM | POA: Diagnosis not present

## 2023-03-02 DIAGNOSIS — Z7984 Long term (current) use of oral hypoglycemic drugs: Secondary | ICD-10-CM | POA: Diagnosis not present

## 2023-03-02 DIAGNOSIS — I10 Essential (primary) hypertension: Secondary | ICD-10-CM | POA: Diagnosis not present

## 2023-04-29 DIAGNOSIS — R0982 Postnasal drip: Secondary | ICD-10-CM | POA: Diagnosis not present

## 2023-04-29 DIAGNOSIS — R053 Chronic cough: Secondary | ICD-10-CM | POA: Diagnosis not present

## 2023-04-29 DIAGNOSIS — E1169 Type 2 diabetes mellitus with other specified complication: Secondary | ICD-10-CM | POA: Diagnosis not present

## 2023-04-30 ENCOUNTER — Ambulatory Visit: Payer: No Typology Code available for payment source | Admitting: Sports Medicine

## 2023-04-30 ENCOUNTER — Ambulatory Visit: Payer: No Typology Code available for payment source | Admitting: Orthopaedic Surgery

## 2023-05-07 DIAGNOSIS — R053 Chronic cough: Secondary | ICD-10-CM | POA: Diagnosis not present

## 2023-06-30 ENCOUNTER — Other Ambulatory Visit (INDEPENDENT_AMBULATORY_CARE_PROVIDER_SITE_OTHER): Payer: Self-pay

## 2023-06-30 ENCOUNTER — Ambulatory Visit: Payer: BC Managed Care – PPO | Admitting: Orthopaedic Surgery

## 2023-06-30 ENCOUNTER — Encounter: Payer: Self-pay | Admitting: Orthopaedic Surgery

## 2023-06-30 DIAGNOSIS — M1611 Unilateral primary osteoarthritis, right hip: Secondary | ICD-10-CM | POA: Insufficient documentation

## 2023-06-30 NOTE — Progress Notes (Signed)
Office Visit Note   Patient: Antonio Nash           Date of Birth: Jan 28, 1956           MRN: [id number] Visit Date: 06/30/2023              Requested by: [facility] [address] [place],  [place] [address] PCP: [facility]   Assessment & Plan: Visit Diagnoses:  1. Primary osteoarthritis of right hip     Plan: Jerediah a 67 year old gentleman with end-stage right hip DJD.  At this point he would like to move forward with a right total hip replacement.  He did well from his left hip that was done about 2 years ago.  We are awaiting a referral from the [place] system to approve the surgery.  He will need clearance from Dr. Judeth Horn his pulmonologist prior to surgery.  Follow-Up Instructions: No follow-ups on file.   Orders:  Orders Placed This Encounter  Procedures   XR HIP UNILAT W OR W/O PELVIS 2-3 VIEWS RIGHT   No orders of the defined types were placed in this encounter.     Procedures: No procedures performed   Clinical Data: No additional findings.   Subjective: Chief Complaint  Patient presents with   Right Hip - Pain    HPI Jaymen comes in today for evaluation of chronic severe right hip pain.  He has advanced DJD.  He is approximately 2 years status post left total hip replacement.  He has done very well from the surgery and very pleased. Review of Systems  Constitutional: Negative.   HENT: Negative.    Eyes: Negative.   Respiratory: Negative.    Cardiovascular: Negative.   Gastrointestinal: Negative.   Endocrine: Negative.   Genitourinary: Negative.   Skin: Negative.   Allergic/Immunologic: Negative.   Neurological: Negative.   Hematological: Negative.   Psychiatric/Behavioral: Negative.    All other systems reviewed and are negative.    Objective: Vital Signs: There were no vitals taken for this visit.  Physical Exam Vitals and nursing note reviewed.  Constitutional:      Appearance: He is well-developed.  HENT:     Head:  Normocephalic and atraumatic.  Eyes:     Pupils: Pupils are equal, round, and reactive to light.  Pulmonary:     Effort: Pulmonary effort is normal.  Abdominal:     Palpations: Abdomen is soft.  Musculoskeletal:        General: Normal range of motion.     Cervical back: Neck supple.  Skin:    General: Skin is warm.  Neurological:     Mental Status: He is alert and oriented to person, place, and time.  Psychiatric:        Behavior: Behavior normal.        Thought Content: Thought content normal.        Judgment: Judgment normal.     Ortho Exam Exam of the right hip shows severe pain with hip flexion and internal rotation.  Antalgic gait.  No trochanteric tenderness. Specialty Comments:  No specialty comments available.  Imaging: No results found.   PMFS History: Patient Active Problem List   Diagnosis Date Noted   Primary osteoarthritis of right hip 06/30/2023   Status post total replacement of left hip 05/06/2021   Displaced fracture of lateral malleolus of right fibula, initial encounter for closed fracture 12/21/2020   Lobar pneumonia, unspecified organism (HCC)    DVT (deep venous thrombosis) (  HCC) 10/09/2020   Pulmonary emboli (HCC) 10/08/2020   Primary osteoarthritis of left hip 10/02/2020   Discitis of thoracolumbar region 09/19/2020   Medication monitoring encounter 09/19/2020   Osteomyelitis (HCC) 08/31/2020   HTN (hypertension) 08/31/2020   Type 2 diabetes mellitus without complication (HCC) 08/31/2020   Past Medical History:  Diagnosis Date   Diabetes mellitus without complication (HCC)    Hypertension    PE (pulmonary thromboembolism) (HCC) 10/09/2020   Pneumonia 09/2020    No family history on file.  Past Surgical History:  Procedure Laterality Date   COLONOSCOPY     HERNIA REPAIR     KNEE SURGERY Right    ROTATOR CUFF REPAIR     TOTAL HIP ARTHROPLASTY Left 05/06/2021   Procedure: LEFT TOTAL HIP ARTHROPLASTY ANTERIOR APPROACH;  Surgeon: Tarry Kos, MD;  Location: [facility] OR;  Service: Orthopedics;  Laterality: Left;   Social History   Occupational History   Not on file  Tobacco Use   Smoking status: Never   Smokeless tobacco: Never  Vaping Use   Vaping status: Never Used  Substance and Sexual Activity   Alcohol use: Not Currently   Drug use: Never   Sexual activity: Yes

## 2023-07-07 ENCOUNTER — Telehealth: Payer: Self-pay | Admitting: Orthopaedic Surgery

## 2023-07-07 DIAGNOSIS — Z86711 Personal history of pulmonary embolism: Secondary | ICD-10-CM | POA: Diagnosis not present

## 2023-07-07 DIAGNOSIS — E119 Type 2 diabetes mellitus without complications: Secondary | ICD-10-CM | POA: Diagnosis not present

## 2023-07-07 DIAGNOSIS — M1611 Unilateral primary osteoarthritis, right hip: Secondary | ICD-10-CM | POA: Diagnosis not present

## 2023-07-07 DIAGNOSIS — I10 Essential (primary) hypertension: Secondary | ICD-10-CM | POA: Diagnosis not present

## 2023-07-07 DIAGNOSIS — Z23 Encounter for immunization: Secondary | ICD-10-CM | POA: Diagnosis not present

## 2023-07-07 DIAGNOSIS — Z7984 Long term (current) use of oral hypoglycemic drugs: Secondary | ICD-10-CM | POA: Diagnosis not present

## 2023-07-07 NOTE — Telephone Encounter (Signed)
[facility] stating [facility] stating why pt will need [facility], info will need to be faxed over to [phone]

## 2023-08-26 DIAGNOSIS — E119 Type 2 diabetes mellitus without complications: Secondary | ICD-10-CM | POA: Diagnosis not present

## 2023-08-26 DIAGNOSIS — H527 Unspecified disorder of refraction: Secondary | ICD-10-CM | POA: Diagnosis not present

## 2023-08-26 DIAGNOSIS — H35363 Drusen (degenerative) of macula, bilateral: Secondary | ICD-10-CM | POA: Diagnosis not present

## 2023-08-26 DIAGNOSIS — H35411 Lattice degeneration of retina, right eye: Secondary | ICD-10-CM | POA: Diagnosis not present

## 2023-08-31 DIAGNOSIS — M1611 Unilateral primary osteoarthritis, right hip: Secondary | ICD-10-CM | POA: Diagnosis not present

## 2023-09-02 DIAGNOSIS — J069 Acute upper respiratory infection, unspecified: Secondary | ICD-10-CM | POA: Diagnosis not present

## 2023-09-02 DIAGNOSIS — R6889 Other general symptoms and signs: Secondary | ICD-10-CM | POA: Diagnosis not present

## 2023-09-18 ENCOUNTER — Ambulatory Visit (INDEPENDENT_AMBULATORY_CARE_PROVIDER_SITE_OTHER): Payer: No Typology Code available for payment source | Admitting: Orthopaedic Surgery

## 2023-09-18 DIAGNOSIS — M1611 Unilateral primary osteoarthritis, right hip: Secondary | ICD-10-CM | POA: Diagnosis not present

## 2023-09-18 NOTE — Progress Notes (Signed)
Office Visit Note   Patient: Antonio Nash           Date of Birth: 14-Feb-1956           MRN: [id number] Visit Date: 09/18/2023              Requested by: [facility] [address] [place],  [place] [address] PCP: [facility]   Assessment & Plan: Visit Diagnoses:  1. Primary osteoarthritis of right hip     Plan: Impression is severe right hip degenerative joint disease secondary to Osteoarthritis.  Imaging shows bone on bone joint space narrowing.  At this point, conservative treatments fail to provide any significant relief and the pain is severely affecting ADLs and quality of life.  Based on treatment options, the patient has elected to move forward with a hip replacement.  We have discussed the surgical risks that include but are not limited to infection, DVT, leg length discrepancy, numbness, tingling, incomplete relief of pain.  Recovery and prognosis were also reviewed.    Current anticoagulants: No antithrombotic Postop anticoagulation: Xarelto Diabetic: Yes  Prior DVT/PE: Yes, DVT and PE Tobacco use: No Clearances needed for surgery: none Anticipate discharge dispo: home   Follow-Up Instructions: No follow-ups on file.   Orders:  No orders of the defined types were placed in this encounter.  No orders of the defined types were placed in this encounter.     Procedures: No procedures performed   Clinical Data: No additional findings.   Subjective: Chief Complaint  Patient presents with   Right Hip - Follow-up    HPI patient is a pleasant 68 year old gentleman who comes in today with continued right hip pain.  History of osteoarthritis which has continued to worsen.  All of his pain is to the groin and is worse with sitting or riding in a car.  He takes ibuprofen without relief.  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  He has not seen pulmonology in years.  Currently  not on any anticoagulants.  He is a diabetic.  I am unable to see any recent hemoglobin A1c in the chart.  Review of Systems as detailed in HPI.  All others reviewed and are negative.   Objective: Vital Signs: There were no vitals taken for this visit.  Physical Exam well-developed well-nourished gentleman in no acute distress.  Alert and oriented x 3.  Ortho Exam right hip exam: Pain with hip flexion and logroll.  He is neurovascularly intact distally.  Specialty Comments:  No specialty comments available.  Imaging: No new imaging   PMFS History: Patient Active Problem List   Diagnosis Date Noted   Primary osteoarthritis of right hip 06/30/2023   Status post total replacement of left hip 05/06/2021   Displaced fracture of lateral malleolus of right fibula, initial encounter for closed fracture 12/21/2020   Lobar pneumonia, unspecified organism (HCC)    DVT (deep venous thrombosis) (HCC) 10/09/2020   Pulmonary emboli (HCC) 10/08/2020   Primary osteoarthritis of left hip 10/02/2020   Discitis of thoracolumbar region 09/19/2020   Medication monitoring encounter 09/19/2020   Osteomyelitis (HCC) 08/31/2020   HTN (hypertension) 08/31/2020   Type 2 diabetes mellitus without complication (HCC) 08/31/2020   Past Medical History:  Diagnosis Date   Diabetes mellitus without complication (HCC)    Hypertension    PE (pulmonary thromboembolism) (HCC) 10/09/2020   Pneumonia 09/2020    No family  history on file.  Past Surgical History:  Procedure Laterality Date   COLONOSCOPY     HERNIA REPAIR     KNEE SURGERY Right    ROTATOR CUFF REPAIR     TOTAL HIP ARTHROPLASTY Left 05/06/2021   Procedure: LEFT TOTAL HIP ARTHROPLASTY ANTERIOR APPROACH;  Surgeon: Tarry Kos, MD;  Location: [facility] OR;  Service: Orthopedics;  Laterality: Left;   Social History   Occupational History   Not on file  Tobacco Use   Smoking status: Never   Smokeless tobacco: Never  Vaping Use   Vaping status:  Never Used  Substance and Sexual Activity   Alcohol use: Not Currently   Drug use: Never   Sexual activity: Yes

## 2023-09-25 DIAGNOSIS — H35411 Lattice degeneration of retina, right eye: Secondary | ICD-10-CM | POA: Diagnosis not present

## 2023-10-05 ENCOUNTER — Other Ambulatory Visit: Payer: Self-pay

## 2023-10-09 DIAGNOSIS — E1136 Type 2 diabetes mellitus with diabetic cataract: Secondary | ICD-10-CM | POA: Diagnosis not present

## 2023-10-09 DIAGNOSIS — H25813 Combined forms of age-related cataract, bilateral: Secondary | ICD-10-CM | POA: Diagnosis not present

## 2023-10-09 DIAGNOSIS — H35411 Lattice degeneration of retina, right eye: Secondary | ICD-10-CM | POA: Diagnosis not present

## 2023-11-03 ENCOUNTER — Other Ambulatory Visit: Payer: Self-pay | Admitting: Physician Assistant

## 2023-11-03 MED ORDER — DOCUSATE SODIUM 100 MG PO CAPS: 100.0000 mg | ORAL_CAPSULE | Freq: Every day | ORAL | 2 refills | Status: DC | PRN

## 2023-11-03 MED ORDER — METHOCARBAMOL 750 MG PO TABS: 750.0000 mg | ORAL_TABLET | Freq: Three times a day (TID) | ORAL | 2 refills | Status: DC | PRN

## 2023-11-03 MED ORDER — DOXYCYCLINE HYCLATE 100 MG PO TABS: 100.0000 mg | ORAL_TABLET | Freq: Two times a day (BID) | ORAL | 0 refills | Status: DC

## 2023-11-03 MED ORDER — OXYCODONE-ACETAMINOPHEN 5-325 MG PO TABS: 1.0000 | ORAL_TABLET | Freq: Four times a day (QID) | ORAL | 0 refills | Status: DC | PRN

## 2023-11-03 MED ORDER — ONDANSETRON HCL 4 MG PO TABS: 4.0000 mg | ORAL_TABLET | Freq: Three times a day (TID) | ORAL | 0 refills | Status: DC | PRN

## 2023-11-05 NOTE — Progress Notes (Signed)
 Surgical Instructions   Your procedure is scheduled on Monday, March 31st, 2025. Report to [facility] Main Entrance "A" at 7:45 A.M., then check in with the Admitting office. Any questions or running late day of surgery: call [phone]  Questions prior to your surgery date: call [phone], Monday-Friday, 8am-4pm. If you experience any cold or flu symptoms such as cough, fever, chills, shortness of breath, etc. between now and your scheduled surgery, please notify us at the above number.     Remember:  Do not eat after midnight the night before your surgery  You may drink clear liquids until 7:15 the morning of your surgery.   Clear liquids allowed are: Water, Non-Citrus Juices (without pulp), Carbonated Beverages, Clear Tea (no milk, honey, etc.), Black Coffee Only (NO MILK, CREAM OR POWDERED CREAMER of any kind), and Gatorade.  Patient Instructions   The day of surgery (if you have diabetes): Drink ONE (1) 12 oz G2 given to you in your pre admission testing appointment by 7:15 the morning of surgery. Drink in one sitting. Do not sip.  This drink was given to you during your hospital  pre-op appointment visit.  Nothing else to drink after completing the  12 oz bottle of G2.         If you have questions, please contact your surgeon's office.     Take these medicines the morning of surgery with A SIP OF WATER: Amlodipine (Norvasc) Carvedilol (Coreg)    May take these medicines IF NEEDED: Fluticasone (Flonase)    One week prior to surgery, STOP taking any Aspirin (unless otherwise instructed by your surgeon) Aleve, Naproxen, Ibuprofen, Motrin, Advil, Goody's, BC's, all herbal medications, fish oil, and non-prescription vitamins.   WHAT DO I DO ABOUT MY DIABETES MEDICATION?   Do not take oral diabetes medicines (pills) the morning of surgery.  Do NOT take Glimepiride (Amaryl) the morning of surgery.    Empagliflozin (Jardiance) should be held for 72 hours prior to  surgery.  Your last dose should be Thursday, March 27th.         HOW TO MANAGE YOUR DIABETES BEFORE AND AFTER SURGERY  Why is it important to control my blood sugar before and after surgery? Improving blood sugar levels before and after surgery helps healing and can limit problems. A way of improving blood sugar control is eating a healthy diet by:  Eating less sugar and carbohydrates  Increasing activity/exercise  Talking with your doctor about reaching your blood sugar goals High blood sugars (greater than 180 mg/dL) can raise your risk of infections and slow your recovery, so you will need to focus on controlling your diabetes during the weeks before surgery. Make sure that the doctor who takes care of your diabetes knows about your planned surgery including the date and location.  How do I manage my blood sugar before surgery? Check your blood sugar at least 4 times a day, starting 2 days before surgery, to make sure that the level is not too high or low.  Check your blood sugar the morning of your surgery when you wake up and every 2 hours until you get to the Short Stay unit.  If your blood sugar is less than 70 mg/dL, you will need to treat for low blood sugar: Do not take insulin. Treat a low blood sugar (less than 70 mg/dL) with  cup of clear juice (cranberry or apple), 4 glucose tablets, OR glucose gel. Recheck blood sugar in 15 minutes after treatment (to  make sure it is greater than 70 mg/dL). If your blood sugar is not greater than 70 mg/dL on recheck, call [phone] for further instructions. Report your blood sugar to the short stay nurse when you get to Short Stay.  If you are admitted to the hospital after surgery: Your blood sugar will be checked by the staff and you will probably be given insulin after surgery (instead of oral diabetes medicines) to make sure you have good blood sugar levels. The goal for blood sugar control after surgery is 80-180 mg/dL.                       Do NOT Smoke (Tobacco/Vaping) for 24 hours prior to your procedure.  If you use a CPAP at night, you may bring your mask/headgear for your overnight stay.   You will be asked to remove any contacts, glasses, piercing's, hearing aid's, dentures/partials prior to surgery. Please bring cases for these items if needed.    Patients discharged the day of surgery will not be allowed to drive home, and someone needs to stay with them for 24 hours.  SURGICAL WAITING ROOM VISITATION Patients may have no more than 2 support people in the waiting area - these visitors may rotate.   Pre-op nurse will coordinate an appropriate time for 1 ADULT support person, who may not rotate, to accompany patient in pre-op.  Children under the age of 45 must have an adult with them who is not the patient and must remain in the main waiting area with an adult.  If the patient needs to stay at the hospital during part of their recovery, the visitor guidelines for inpatient rooms apply.  Please refer to the [facility] website for the visitor guidelines for any additional information.   If you received a COVID test during your pre-op visit  it is requested that you wear a mask when out in public, stay away from anyone that may not be feeling well and notify your surgeon if you develop symptoms. If you have been in contact with anyone that has tested positive in the last 10 days please notify you surgeon.      Pre-operative 5 CHG Bathing Instructions   You can play a key role in reducing the risk of infection after surgery. Your skin needs to be as free of germs as possible. You can reduce the number of germs on your skin by washing with CHG (chlorhexidine gluconate) soap before surgery. CHG is an antiseptic soap that kills germs and continues to kill germs even after washing.   DO NOT use if you have an allergy to chlorhexidine/CHG or antibacterial soaps. If your skin becomes reddened or irritated, stop  using the CHG and notify one of our RNs at [phone].   Please shower with the CHG soap starting 4 days before surgery using the following schedule:     Please keep in mind the following:  DO NOT shave, including legs and underarms, starting the day of your first shower.   You may shave your face at any point before/day of surgery.  Place clean sheets on your bed the day you start using CHG soap. Use a clean washcloth (not used since being washed) for each shower. DO NOT sleep with pets once you start using the CHG.   CHG Shower Instructions:  Wash your face and private area with normal soap. If you choose to wash your hair, wash first with your normal shampoo.  After you use shampoo/soap, rinse your hair and body thoroughly to remove shampoo/soap residue.  Turn the water OFF and apply about 3 tablespoons (45 ml) of CHG soap to a CLEAN washcloth.  Apply CHG soap ONLY FROM YOUR NECK DOWN TO YOUR TOES (washing for 3-5 minutes)  DO NOT use CHG soap on face, private areas, open wounds, or sores.  Pay special attention to the area where your surgery is being performed.  If you are having back surgery, having someone wash your back for you may be helpful. Wait 2 minutes after CHG soap is applied, then you may rinse off the CHG soap.  Pat dry with a clean towel  Put on clean clothes/pajamas   If you choose to wear lotion, please use ONLY the CHG-compatible lotions that are listed below.  Additional instructions for the day of surgery: DO NOT APPLY any lotions, deodorants, cologne, or perfumes.   Do not bring valuables to the hospital. [facility] is not responsible for any belongings/valuables. Do not wear nail polish, gel polish, artificial nails, or any other type of covering on natural nails (fingers and toes) Do not wear jewelry or makeup Put on clean/comfortable clothes.  Please brush your teeth.  Ask your nurse before applying any prescription medications to the skin.     CHG  Compatible Lotions   Aveeno Moisturizing lotion  Cetaphil Moisturizing Cream  Cetaphil Moisturizing Lotion  Clairol Herbal Essence Moisturizing Lotion, Dry Skin  Clairol Herbal Essence Moisturizing Lotion, Extra Dry Skin  Clairol Herbal Essence Moisturizing Lotion, Normal Skin  Curel Age Defying Therapeutic Moisturizing Lotion with Alpha Hydroxy  Curel Extreme Care Body Lotion  Curel Soothing Hands Moisturizing Hand Lotion  Curel Therapeutic Moisturizing Cream, Fragrance-Free  Curel Therapeutic Moisturizing Lotion, Fragrance-Free  Curel Therapeutic Moisturizing Lotion, Original Formula  Eucerin Daily Replenishing Lotion  Eucerin Dry Skin Therapy Plus Alpha Hydroxy Crme  Eucerin Dry Skin Therapy Plus Alpha Hydroxy Lotion  Eucerin Original Crme  Eucerin Original Lotion  Eucerin Plus Crme Eucerin Plus Lotion  Eucerin TriLipid Replenishing Lotion  Keri Anti-Bacterial Hand Lotion  Keri Deep Conditioning Original Lotion Dry Skin Formula Softly Scented  Keri Deep Conditioning Original Lotion, Fragrance Free Sensitive Skin Formula  Keri Lotion Fast Absorbing Fragrance Free Sensitive Skin Formula  Keri Lotion Fast Absorbing Softly Scented Dry Skin Formula  Keri Original Lotion  Keri Skin Renewal Lotion Keri Silky Smooth Lotion  Keri Silky Smooth Sensitive Skin Lotion  Nivea Body Creamy Conditioning Oil  Nivea Body Extra Enriched Lotion  Nivea Body Original Lotion  Nivea Body Sheer Moisturizing Lotion Nivea Crme  Nivea Skin Firming Lotion  NutraDerm 30 Skin Lotion  NutraDerm Skin Lotion  NutraDerm Therapeutic Skin Cream  NutraDerm Therapeutic Skin Lotion  ProShield Protective Hand Cream  Provon moisturizing lotion  Please read over the following fact sheets that you were given.

## 2023-11-06 ENCOUNTER — Encounter (HOSPITAL_COMMUNITY)
Admission: RE | Admit: 2023-11-06 | Discharge: 2023-11-06 | Disposition: A | Discharge status: Home / Self Care | Source: Ambulatory Visit | Attending: Orthopaedic Surgery | Admitting: Orthopaedic Surgery

## 2023-11-06 ENCOUNTER — Encounter (HOSPITAL_COMMUNITY): Payer: Self-pay

## 2023-11-06 ENCOUNTER — Other Ambulatory Visit: Payer: Self-pay

## 2023-11-06 VITALS — BP 133/72 | HR 71 | Temp 97.8°F | Resp 18 | Ht 68.0 in | Wt 207.4 lb

## 2023-11-06 DIAGNOSIS — I10 Essential (primary) hypertension: Secondary | ICD-10-CM | POA: Diagnosis not present

## 2023-11-06 DIAGNOSIS — E119 Type 2 diabetes mellitus without complications: Secondary | ICD-10-CM | POA: Insufficient documentation

## 2023-11-06 DIAGNOSIS — Z01818 Encounter for other preprocedural examination: Secondary | ICD-10-CM | POA: Diagnosis present

## 2023-11-06 DIAGNOSIS — Z86711 Personal history of pulmonary embolism: Secondary | ICD-10-CM | POA: Diagnosis not present

## 2023-11-06 DIAGNOSIS — M1611 Unilateral primary osteoarthritis, right hip: Secondary | ICD-10-CM | POA: Diagnosis not present

## 2023-11-06 DIAGNOSIS — Z7984 Long term (current) use of oral hypoglycemic drugs: Secondary | ICD-10-CM | POA: Insufficient documentation

## 2023-11-06 HISTORY — DX: Unspecified osteoarthritis, unspecified site: M19.90

## 2023-11-06 LAB — COMPREHENSIVE METABOLIC PANEL
ALT: 26 U/L (ref 0–44)
AST: 23 U/L (ref 15–41)
Albumin: 3.8 g/dL (ref 3.5–5.0)
Alkaline Phosphatase: 45 U/L (ref 38–126)
Anion gap: 7 (ref 5–15)
BUN: 18 mg/dL (ref 8–23)
CO2: 25 mmol/L (ref 22–32)
Calcium: 8.8 mg/dL — ABNORMAL LOW (ref 8.9–10.3)
Chloride: 105 mmol/L (ref 98–111)
Creatinine, Ser: 0.91 mg/dL (ref 0.61–1.24)
GFR, Estimated: >60 mL/min (ref >60)
Glucose, Bld: 225 mg/dL — ABNORMAL HIGH (ref 70–99)
Potassium: 4.6 mmol/L (ref 3.5–5.1)
Sodium: 137 mmol/L (ref 135–145)
Total Bilirubin: 0.8 mg/dL (ref 0.0–1.2)
Total Protein: 6.9 g/dL (ref 6.5–8.1)

## 2023-11-06 LAB — CBC
HCT: 45.9 % (ref 39.0–52.0)
Hemoglobin: 14.6 g/dL (ref 13.0–17.0)
MCH: 28.1 pg (ref 26.0–34.0)
MCHC: 31.8 g/dL (ref 30.0–36.0)
MCV: 88.3 fL (ref 80.0–100.0)
Platelets: 247 10*3/uL (ref 150–400)
RBC: 5.2 MIL/uL (ref 4.22–5.81)
RDW: 13.3 % (ref 11.5–15.5)
WBC: 5.7 10*3/uL (ref 4.0–10.5)
nRBC: 0 % (ref 0.0–0.2)

## 2023-11-06 LAB — GLUCOSE, CAPILLARY: Glucose-Capillary: 202 mg/dL — ABNORMAL HIGH (ref 70–99)

## 2023-11-06 LAB — SURGICAL PCR SCREEN
MRSA, PCR: NEGATIVE
Staphylococcus aureus: NEGATIVE

## 2023-11-06 LAB — TYPE AND SCREEN
ABO/RH(D): O POS
Antibody Screen: NEGATIVE

## 2023-11-06 LAB — HEMOGLOBIN A1C
Hgb A1c MFr Bld: 9.6 % — ABNORMAL HIGH (ref 4.8–5.6)
Mean Plasma Glucose: 228.82 mg/dL

## 2023-11-06 NOTE — Progress Notes (Signed)
 PCP - [facility] Cardiologist - denies Pulmonologist - Dr. Judeth Horn but has not seen in several years - patient states that he was cleared from him  PPM/ICD - denies Device Orders - n/a Rep Notified - n/a  Chest x-ray - denies EKG - 11/06/23 Stress Test - denies ECHO - 10/09/20 Cardiac Cath - denies  Sleep Study - denies CPAP - n/a  Fasting Blood Sugar - patient is unsure of fasting blood sugar - states that he only checks his blood sugar 1-2 a week and that it's normally in the evening.  He said in the evening it is normally low 100's.  At his PAT appointment, blood sugar was 202.  Patient stated that he has not had anything to eat or drink this AM.   Last dose of GLP1 agonist-  n/a GLP1 instructions: n/a  Blood Thinner Instructions: n/a Aspirin Instructions: n/a  ERAS Protcol - clears until 0715 PRE-SURGERY Ensure or G2-  G2 as ordered  COVID TEST- n/a   Anesthesia review: yes - HTN, history of PE,   Patient denies shortness of breath, fever, cough and chest pain at PAT appointment   All instructions explained to the patient, with a verbal understanding of the material. Patient agrees to go over the instructions while at home for a better understanding. Patient also instructed to self quarantine after being tested for COVID-19. The opportunity to ask questions was provided.

## 2023-11-09 ENCOUNTER — Encounter (HOSPITAL_COMMUNITY): Payer: Self-pay | Admitting: Vascular Surgery

## 2023-11-09 NOTE — Progress Notes (Incomplete)
 Anesthesia Chart Review:  Case: [id number] Date/Time: 11/16/23 1000   Procedure: RIGHT TOTAL HIP ARTHROPLASTY ANTERIOR APPROACH (Right: Hip) - 3-C   Anesthesia type: Spinal   Pre-op diagnosis: right hip osteoarthritis   [facility] ROOM 04 / [facility]   Surgeons: Tarry Kos, MD       DISCUSSION: Patient is a 68 year old male scheduled for the above procedure.  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    A1c 9.6% on 11/06/23. Will communicate with surgeon.  VS: BP 133/72   Pulse 71   Temp 36.6 C (Oral)   Resp 18   Ht 5\' 8"  (1.727 m)   Wt 94.1 kg   SpO2 100%   BMI 31.54 kg/m   PROVIDERS: [facility] - Vilinda Boehringer is PCP    LABS: {CHL AN LABS REVIEWED:112001::"Labs reviewed: Acceptable for surgery."} (all labs ordered are listed, but only abnormal results are displayed)  Labs Reviewed  GLUCOSE, CAPILLARY - Abnormal; Notable for the following components:      Result Value   Glucose-Capillary 202 (*)    All other components within normal limits  HEMOGLOBIN A1C - Abnormal; Notable for the following components:   Hgb A1c MFr Bld 9.6 (*)    All other components within normal limits  COMPREHENSIVE METABOLIC PANEL - Abnormal; Notable for the following components:   Glucose, Bld 225 (*)    Calcium 8.8 (*)    All other components within normal limits  SURGICAL PCR SCREEN  CBC  TYPE AND SCREEN     IMAGES:   EKG: EKG 11/06/23: Normal sinus rhythm Nonspecific T wave abnormality Abnormal ECG When compared with ECG of 24-Apr-2021 10:59, No significant change was found Confirmed by Sherryl Manges [phone]) on 11/08/2023 2:55:54 PM   CV: Echo 10/09/20 (in setting of acute PE): IMPRESSIONS   1. Left ventricular ejection fraction, by estimation, is 55 to 60%. The  left  ventricle has normal function. The left ventricle has no regional  wall motion abnormalities. Left ventricular diastolic parameters were  normal.   2. Right ventricular systolic function is normal. The right ventricular  size is normal. There is moderately elevated pulmonary artery systolic  pressure.   3. Left atrial size was mildly dilated.   4. Right atrial size was mildly dilated.   5. The mitral valve is normal in structure. Trivial mitral valve  regurgitation. No evidence of mitral stenosis.   6. The aortic valve is tricuspid. Aortic valve regurgitation is not  visualized. No aortic stenosis is present.   7. The inferior vena cava is normal in size with greater than 50%  respiratory variability, suggesting right atrial pressure of 3 mmHg.  - Comparison(s): No prior Echocardiogram.   Past Medical History:  Diagnosis Date  . Arthritis   . Diabetes mellitus without complication (HCC)   . Hypertension   . PE (pulmonary thromboembolism) (HCC) 10/09/2020  . Pneumonia 09/2020    Past Surgical History:  Procedure Laterality Date  . COLONOSCOPY    . HERNIA REPAIR    . KNEE SURGERY Right   . ROTATOR CUFF REPAIR Right   . TOTAL HIP ARTHROPLASTY Left 05/06/2021   Procedure: LEFT TOTAL HIP ARTHROPLASTY ANTERIOR APPROACH;  Surgeon: Tarry Kos, MD;  Location: [facility] OR;  Service: Orthopedics;  Laterality: Left;    MEDICATIONS: .  docusate sodium (COLACE) 100 MG capsule  . doxycycline (VIBRA-TABS) 100 MG tablet  . methocarbamol (ROBAXIN-750) 750 MG tablet  . ondansetron (ZOFRAN) 4 MG tablet  . oxyCODONE-acetaminophen (PERCOCET) 5-325 MG tablet  . amLODipine (NORVASC) 10 MG tablet  . amoxicillin (AMOXIL) 500 MG tablet  . carvedilol (COREG) 25 MG tablet  . diphenhydrAMINE (BENADRYL) 25 MG tablet  . empagliflozin (JARDIANCE) 25 MG TABS tablet  . fluticasone (FLONASE) 50 MCG/ACT nasal spray  . glimepiride (AMARYL) 4 MG tablet  . ibuprofen (ADVIL) 200 MG tablet  . lisinopril (ZESTRIL)  40 MG tablet  . sildenafil (VIAGRA) 50 MG tablet   No current facility-administered medications for this encounter.

## 2023-11-09 NOTE — Progress Notes (Signed)
 Anesthesia Chart Review:  Case: [id number] Date/Time: 11/16/23 1000   Procedure: RIGHT TOTAL HIP ARTHROPLASTY ANTERIOR APPROACH (Right: Hip) - 3-C   Anesthesia type: Spinal   Pre-op diagnosis: right hip osteoarthritis   [facility]   Surgeons: Tarry Kos, MD       DISCUSSION:  VS: BP 133/72   Pulse 71   Temp 36.6 C (Oral)   Resp 18   Ht 5\' 8"  (1.727 m)   Wt 94.1 kg   SpO2 100%   BMI 31.54 kg/m   PROVIDERS: [facility]   LABS: {CHL AN LABS REVIEWED:112001::"Labs reviewed: Acceptable for surgery."} (all labs ordered are listed, but only abnormal results are displayed)  Labs Reviewed  GLUCOSE, CAPILLARY - Abnormal; Notable for the following components:      Result Value   Glucose-Capillary 202 (*)    All other components within normal limits  HEMOGLOBIN A1C - Abnormal; Notable for the following components:   Hgb A1c MFr Bld 9.6 (*)    All other components within normal limits  COMPREHENSIVE METABOLIC PANEL - Abnormal; Notable for the following components:   Glucose, Bld 225 (*)    Calcium 8.8 (*)    All other components within normal limits  SURGICAL PCR SCREEN  CBC  TYPE AND SCREEN     IMAGES:   EKG:   CV:  Past Medical History:  Diagnosis Date   Arthritis    Diabetes mellitus without complication (HCC)    Hypertension    PE (pulmonary thromboembolism) (HCC) 10/09/2020   Pneumonia 09/2020    Past Surgical History:  Procedure Laterality Date   COLONOSCOPY     HERNIA REPAIR     KNEE SURGERY Right    ROTATOR CUFF REPAIR Right    TOTAL HIP ARTHROPLASTY Left 05/06/2021   Procedure: LEFT TOTAL HIP ARTHROPLASTY ANTERIOR APPROACH;  Surgeon: Tarry Kos, MD;  Location: [facility];  Service: Orthopedics;  Laterality: Left;    MEDICATIONS:  docusate sodium (COLACE) 100 MG capsule   doxycycline (VIBRA-TABS) 100 MG tablet   methocarbamol (ROBAXIN-750) 750 MG tablet   ondansetron (ZOFRAN) 4 MG tablet   oxyCODONE-acetaminophen  (PERCOCET) 5-325 MG tablet   amLODipine (NORVASC) 10 MG tablet   amoxicillin (AMOXIL) 500 MG tablet   carvedilol (COREG) 25 MG tablet   diphenhydrAMINE (BENADRYL) 25 MG tablet   empagliflozin (JARDIANCE) 25 MG TABS tablet   fluticasone (FLONASE) 50 MCG/ACT nasal spray   glimepiride (AMARYL) 4 MG tablet   ibuprofen (ADVIL) 200 MG tablet   lisinopril (ZESTRIL) 40 MG tablet   sildenafil (VIAGRA) 50 MG tablet   No current facility-administered medications for this encounter.

## 2023-11-10 ENCOUNTER — Telehealth: Payer: Self-pay | Admitting: Orthopaedic Surgery

## 2023-11-10 NOTE — Telephone Encounter (Signed)
 Called patient to inform the right total hip replacement for 11-16-23 with Dr. Roda Shutters will need to be cancelled due to A1c at 9.6.  Patient was advised to contact PCP ([facility] to discuss the plan to lower A1c through medication adjustment or dietary changes.  Patient is aware the hip surgery cannot be scheduled within 90 days and his levels will need to be 7.5 or below .  [facility] verbalized understanding [facility].

## 2023-11-11 NOTE — Telephone Encounter (Signed)
 7.8 or lower

## 2023-11-16 ENCOUNTER — Ambulatory Visit (HOSPITAL_COMMUNITY)
Admission: RE | Admit: 2023-11-16 | Payer: No Typology Code available for payment source | Source: Home / Self Care | Admitting: Orthopaedic Surgery

## 2023-11-16 ENCOUNTER — Encounter (HOSPITAL_COMMUNITY): Admission: RE | Payer: Self-pay | Source: Home / Self Care

## 2023-11-16 DIAGNOSIS — M1611 Unilateral primary osteoarthritis, right hip: Secondary | ICD-10-CM

## 2023-11-16 SURGERY — ARTHROPLASTY, HIP, TOTAL, ANTERIOR APPROACH
Anesthesia: Spinal | Site: Hip | Laterality: Right

## 2023-12-01 ENCOUNTER — Encounter: Payer: No Typology Code available for payment source | Admitting: Physician Assistant

## 2023-12-18 DIAGNOSIS — R059 Cough, unspecified: Secondary | ICD-10-CM | POA: Diagnosis not present

## 2024-05-13 ENCOUNTER — Encounter: Payer: Self-pay | Admitting: *Deleted

## 2024-05-13 NOTE — Progress Notes (Signed)
 Antonio Nash                                          MRN: [id number]   05/13/2024   The [facility]BCI Quality Team Specialist reviewed this patient medical record for the purposes of chart review for care gap closure. The following were reviewed: abstraction for care gap closure-kidney health evaluation for diabetes:eGFR  and uACR.    [facility]BCI Quality Team

## 2024-06-03 LAB — LAB REPORT - SCANNED
A1c: 6.6
EGFR: 83

## 2024-06-29 ENCOUNTER — Ambulatory Visit (INDEPENDENT_AMBULATORY_CARE_PROVIDER_SITE_OTHER): Admitting: Orthopaedic Surgery

## 2024-06-29 ENCOUNTER — Other Ambulatory Visit (INDEPENDENT_AMBULATORY_CARE_PROVIDER_SITE_OTHER)

## 2024-06-29 DIAGNOSIS — M1611 Unilateral primary osteoarthritis, right hip: Secondary | ICD-10-CM

## 2024-06-29 NOTE — Progress Notes (Addendum)
 Office Visit Note   Patient: Antonio Nash           Date of Birth: 1956-04-28           MRN: [id number] Visit Date: 06/29/2024              Requested by: Antonio Dorn SAUNDERS, PA-C [address] [place],  [place] [address] PCP: [facility]   Assessment & Plan: Visit Diagnoses:  1. Primary osteoarthritis of right hip     Plan: Impression is severe right hip degenerative joint disease secondary to Osteoarthritis.  Patient has attempted conservative treatment for at least 6 consecutive weeks within the past 12 weeks, including but not limited to physical therapy, home exercise program, NSAIDs, activity modification, and/or corticosteroid injections. Despite these efforts, symptoms have not improved or have worsened. Conservative measures have been deemed unsuccessful at this time. After a detailed discussion covering diagnosis and treatment options--including the risks, benefits, alternatives, and potential complications of surgical and nonsurgical management--the patient elected to proceed with surgery.  Current anticoagulants: No antithrombotic Postop anticoagulation: Xarelto  Diabetic: No  Prior DVT/PE: No Tobacco use: No Clearances needed for surgery: None Anticipate discharge dispo: home   Follow-Up Instructions: No follow-ups on file.   Orders:  Orders Placed This Encounter  Procedures  . XR HIP UNILAT W OR W/O PELVIS 2-3 VIEWS RIGHT   No orders of the defined types were placed in this encounter.     Procedures: No procedures performed   Clinical Data: No additional findings.   Subjective: Chief Complaint  Patient presents with  . Right Hip - Follow-up    HPI Antonio Nash returns today to get reevaluated for severe right hip osteoarthritis.  His surgery was canceled earlier in the year due to an elevated A1c.  He just saw his PCP last week and an A1c was 6.6.  He would like to get back on the schedule. Review of Systems  Constitutional: Negative.   HENT:  Negative.    Eyes: Negative.   Respiratory: Negative.    Cardiovascular: Negative.   Gastrointestinal: Negative.   Endocrine: Negative.   Genitourinary: Negative.   Skin: Negative.   Allergic/Immunologic: Negative.   Neurological: Negative.   Hematological: Negative.   Psychiatric/Behavioral: Negative.    All other systems reviewed and are negative.    Objective: Vital Signs: There were no vitals taken for this visit.  Physical Exam Vitals and nursing note reviewed.  Constitutional:      Appearance: He is well-developed.  HENT:     Head: Normocephalic and atraumatic.  Eyes:     Pupils: Pupils are equal, round, and reactive to light.  Pulmonary:     Effort: Pulmonary effort is normal.  Abdominal:     Palpations: Abdomen is soft.  Musculoskeletal:        General: Normal range of motion.     Cervical back: Neck supple.  Skin:    General: Skin is warm.  Neurological:     Mental Status: He is alert and oriented to person, place, and time.  Psychiatric:        Behavior: Behavior normal.        Thought Content: Thought content normal.        Judgment: Judgment normal.     Ortho Exam Examination of the right hip shows significant pain with manipulation of the hip joint.  Antalgic gait. Specialty Comments:  No specialty comments available.  Imaging: XR HIP UNILAT W OR W/O PELVIS 2-3 VIEWS RIGHT  Result Date: 06/29/2024 X-rays of the right hip show advanced degenerative joint disease with bone-on-bone joint space narrowing.  Kellgren-Lawrence stage IV.      PMFS History: Patient Active Problem List   Diagnosis Date Noted  . Primary osteoarthritis of right hip 06/30/2023  . 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 Displaced fracture of lateral malleolus of right fibula, initial encounter for closed fracture 12/21/2020  . Lobar pneumonia, unspecified organism   . DVT (deep venous thrombosis) (HCC) 10/09/2020  . Pulmonary emboli (HCC) 10/08/2020  .  Primary osteoarthritis of left hip 10/02/2020  . Discitis of thoracolumbar region 09/19/2020  . Medication monitoring encounter 09/19/2020  . Osteomyelitis (HCC) 08/31/2020  . HTN (hypertension) 08/31/2020  . Type 2 diabetes mellitus without complication 08/31/2020   Past Medical History:  Diagnosis Date  . Arthritis   . Diabetes mellitus without complication (HCC)   . Hypertension   . PE (pulmonary thromboembolism) (HCC) 10/09/2020  . Pneumonia 09/2020    No family history on file.  Past Surgical History:  Procedure Laterality Date  . COLONOSCOPY    . HERNIA REPAIR    . KNEE SURGERY Right   . ROTATOR CUFF REPAIR Right   . TOTAL HIP ARTHROPLASTY Left 05/06/2021   Procedure: LEFT TOTAL HIP ARTHROPLASTY ANTERIOR APPROACH;  Surgeon: Jerri Kay HERO, MD;  Location: [facility];  Service: Orthopedics;  Laterality: Left;   Social History   Occupational History  . Not on file  Tobacco Use  . Smoking status: Never  . Smokeless tobacco: Never  Vaping Use  . Vaping status: Never Used  Substance and Sexual Activity  . Alcohol use: Yes    Comment: 1 shot a day  . Drug use: Never  . Sexual activity: Yes

## 2024-07-06 NOTE — Progress Notes (Signed)
 Antonio Nash                                          MRN: [id number]   07/06/2024   The [facility]BCI Quality Team Specialist reviewed this patient medical record for the purposes of chart review for care gap closure. The following were reviewed: abstraction for care gap closure-diabetic eye exam, glycemic status assessment, and kidney health evaluation for diabetes:eGFR  and uACR.    VBCI Quality Team

## 2024-07-18 DIAGNOSIS — K5730 Diverticulosis of large intestine without perforation or abscess without bleeding: Secondary | ICD-10-CM | POA: Diagnosis not present

## 2024-07-18 DIAGNOSIS — Z1211 Encounter for screening for malignant neoplasm of colon: Secondary | ICD-10-CM | POA: Diagnosis not present

## 2024-08-14 NOTE — Progress Notes (Signed)
 COVID Vaccine received:  []  No [x]  Yes Date of any COVID positive Test in last 90 days:  PCP - Dorn Novas, PA-C at [facility]   Tinnie Coaster, D Cardiologist - none Pulmonologist- Dr. Donnice Beals, MD (LOV in 2022 post PE/PNA)   Chest x-ray -  EKG - 11-06-23 Epic Stress Test -  ECHO - 10-09-2020 Cardiac Cath -  CT Coronary Calcium score:   Pacemaker / ICD device [x]  No []  Yes   Spinal Cord Stimulator:[x]  No []  Yes       History of Sleep Apnea? [x]  No []  Yes   CPAP used?- [x]  No []  Yes    Medication on DOS: amlodipine , carvedilol , Tylenol , Flonase Nasal spray and Eye drops  Hold DOS:  lisinopril    Patient has: []  NO Hx DM   []  Pre-DM   []  DM1  [x]   DM2 Does the patient monitor blood sugar?   []  N/A   []  No []  Yes  Last A1c was:  9.6 at [place],   down to 6.6    Does patient have a [employer] or Dexcom? []  No []  Yes   Fasting Blood Sugar Ranges-  Checks Blood Sugar _____ times a day  Empagliflozin  25 mg - Hold x 72 hrs Glimepiride  4mg  tab Hold DOS Sitagliptin (Januvia)100 mg tab hold DOS   Blood Thinner / Instructions:  none  Aspirin Instructions:  none  Activity level: Able to walk up 2 flights of stairs without becoming significantly short of breath or having chest pain?   []    Yes   []  No,  would have:  Patient can perform ADLs without assistance.  []   Yes    []  No   Comments: Case was originally on 11-16-23 at [facility], cancelled d/t A1c of 9.6  Anesthesia review: hx PE (provoked PE with borderline right heart strain in setting on acute infection and RUE DVT 2/2 to PICC line 10/08/20), HTN, DM2,   Patient denies any S&S of respiratory illness or Covid - no shortness of breath, fever, cough or chest pain at PAT appointment.  Patient verbalized understanding and agreement to the Pre-Surgical Instructions that were given to them at this PAT appointment. Patient was also educated of the need to review these PAT instructions again prior to his surgery.I reviewed the  appropriate phone numbers to call if they have any and questions or concerns.

## 2024-08-14 NOTE — Patient Instructions (Signed)
 SURGICAL WAITING ROOM VISITATION Patients having surgery or a procedure may have no more than 2 support people in the waiting area - these visitors may rotate in the visitor waiting room.   If the patient needs to stay at the hospital during part of their recovery, the visitor guidelines for inpatient rooms apply.  PRE-OP VISITATION  Pre-op nurse will coordinate an appropriate time for 1 support person to accompany the patient in pre-op.  This support person may not rotate.  This visitor will be contacted when the time is appropriate for the visitor to come back in the pre-op area.  Please refer to the [facility] website for the visitor guidelines for Inpatients (after your surgery is over and you are in a regular room).  Temporary Visitor Restrictions  Children ages 27 and under will not be able to visit patients in [facility] under most circumstances. Visitation is not restricted outside of hospitals unless noted otherwise in the [facility] and Location Specific Visitation Guidelines at :      [email or web address] Visitors with respiratory illnesses are discouraged from visiting and should remain at home.  You are not required to quarantine at this time prior to your surgery. However, you must do this: Hand Hygiene often Do NOT share personal items Notify your provider if you are in close contact with someone who has COVID or you develop fever 100.4 or greater, new onset of sneezing, cough, sore throat, shortness of breath or body aches.  If you test positive for Covid or have been in contact with anyone that has tested positive in the last 10 days please notify you surgeon.    Your procedure is scheduled on:  FRIDAY  08-26-2024  Report to [facility] Main Entrance: [address] entrance where the [employer] is available.   Report to admitting at: 05:15    AM  Call this number if you have any questions or problems the morning of surgery [phone]  Do not eat food  after Midnight the night prior to your surgery/procedure.  After Midnight you may have the following liquids until 04:30 AM  DAY OF SURGERY  Clear Liquid Diet Water  Black Coffee (sugar ok, NO MILK/CREAM OR CREAMERS)  Tea (sugar ok, NO MILK/CREAM OR CREAMERS) regular and decaf                             Plain Jell-O  with no fruit (NO RED)                                           Fruit ices (not with fruit pulp, NO RED)                                     Popsicles (NO RED)                                                                  Juice: NO CITRUS JUICES: only apple, WHITE grape, WHITE cranberry Sports drinks like Gatorade or Powerade (NO RED)  The day of surgery:  Drink ONE (1) Pre-Surgery G2 at  04:30 AM the morning of surgery. Drink in one sitting. Do not sip.  This drink was given to you during your hospital pre-op appointment visit. Nothing else to drink after completing the Pre-Surgery G2 : No candy, chewing gum or throat lozenges.    FOLLOW ANY ADDITIONAL PRE OP INSTRUCTIONS YOU RECEIVED FROM YOUR SURGEON'S OFFICE!!!   Oral Hygiene is also important to reduce your risk of infection.        Remember - BRUSH YOUR TEETH THE MORNING OF SURGERY WITH YOUR REGULAR TOOTHPASTE  Do NOT smoke after Midnight the night before surgery.  Empagliflozin  25 mg - Stop taking 72 hours before your surgery. Last dose will be taken on Monday 08-22-24  Glimepiride  4mg  tab - Day BEFORE surgery, take normal dose.  DAY OF SURGERY, DO NOT TAKE GLIMEPIRIDE .   Sitagliptin (Januvia)100 mg tab- day BEFORE surgery, take normal dose, DAY OF SURGERY, DO NOT TAKE.   STOP TAKING all Vitamins, Herbs and supplements 1 week before your surgery.   Take ONLY these medicines the morning of surgery with A SIP OF WATER : amlodipine , carvedilol , Tylenol ,  and you may use Flonase Nasal spray and Eye drops.   DO NOT TAKE lisinopril  the morning of your surgery.                    You may not  have any metal on your body including jewelry, and body piercing  Do not wear lotions, powders, cologne, or deodorant  Men may shave face and neck.  Contacts, Hearing Aids, dentures or bridgework may not be worn into surgery. DENTURES WILL BE REMOVED PRIOR TO SURGERY PLEASE DO NOT APPLY Poly grip OR ADHESIVES!!!  You may bring a small overnight bag with you on the day of surgery, only pack items that are not valuable. [facility] IS NOT RESPONSIBLE   FOR VALUABLES THAT ARE LOST OR STOLEN.   Do not bring your home medications to the hospital. The Pharmacy will dispense medications listed on your medication list to you during your admission in the Hospital.  Please read over the following fact sheets you were given: IF YOU HAVE QUESTIONS ABOUT YOUR PRE-OP INSTRUCTIONS, PLEASE CALL [phone].      Pre-operative 4 CHG Bath Instructions   You can play a key role in reducing the risk of infection after surgery. Your skin needs to be as free of germs as possible. You can reduce the number of germs on your skin by washing with CHG (chlorhexidine  gluconate) soap before surgery. CHG is an antiseptic soap that kills germs and continues to kill germs even after washing.   DO NOT use if you have an allergy to chlorhexidine /CHG or antibacterial soaps. If your skin becomes reddened or irritated, stop using the CHG and notify one of our RNs at [phone]  Please shower with the CHG soap starting 4 days before surgery using the following schedule: [facility] 08-22-2024     Do NOT use CHG soap  the morning of your                                                                                                                                 surgery.         Please keep in mind the following:  DO NOT shave, including legs and underarms, starting the day of your first shower.   You may  shave your face at any point before/day of surgery.  Place clean sheets on your bed the day you start using CHG soap. Use a clean washcloth (not used since being washed) for each shower. DO NOT sleep with pets once you start using the CHG.  CHG Shower Instructions:  If you choose to wash your hair and private area, wash first with your normal shampoo/soap.  After you use shampoo/soap, rinse your hair and body thoroughly to remove shampoo/soap residue.  Turn the water  OFF and apply about 3 tablespoons (45 ml) of CHG soap to a CLEAN washcloth.  Apply CHG soap ONLY FROM YOUR NECK DOWN TO YOUR TOES (washing for 3-5 minutes)  DO NOT use CHG soap on face, private areas, open wounds, or sores.  Pay special attention to the area where your surgery is being performed.  If you are having back surgery, having someone wash your back for you may be helpful. Wait 2 minutes after CHG soap is applied, then you may rinse off the CHG soap.  Pat dry with a clean towel  Put on clean clothes/pajamas   If you choose to wear lotion, please use ONLY the CHG-compatible lotions on the back of this paper.     Additional instructions for the day of surgery: DO NOT APPLY any CHG Soap,  lotions, deodorants, cologne, or perfumes on the day of surgery  Put on clean/comfortable clothes.  Brush your teeth.  Ask your nurse before applying any prescription medications to the skin.   CHG Compatible Lotions   Aveeno Moisturizing lotion  Cetaphil Moisturizing Cream  Cetaphil Moisturizing Lotion  Clairol Herbal Essence Moisturizing Lotion, Dry Skin  Clairol Herbal Essence Moisturizing Lotion, Extra Dry Skin  Clairol Herbal Essence Moisturizing Lotion, Normal Skin  Curel Age Defying Therapeutic Moisturizing Lotion with Alpha Hydroxy  Curel Extreme Care Body Lotion  Curel Soothing Hands Moisturizing Hand Lotion  Curel Therapeutic Moisturizing Cream, Fragrance-Free  Curel Therapeutic Moisturizing Lotion, Fragrance-Free   Curel Therapeutic Moisturizing Lotion, Original Formula  Eucerin Daily Replenishing Lotion  Eucerin Dry Skin Therapy Plus Alpha Hydroxy Crme  Eucerin Dry Skin Therapy Plus Alpha Hydroxy Lotion  Eucerin Original Crme  Eucerin Original Lotion  Eucerin Plus Crme Eucerin Plus Lotion  Eucerin TriLipid Replenishing Lotion  Keri Anti-Bacterial Hand Lotion  Keri Deep Conditioning Original Lotion Dry Skin Formula Softly Scented  Keri Deep Conditioning Original Lotion, Fragrance Free Sensitive Skin Formula  Keri Lotion Fast Absorbing Fragrance Free Sensitive Skin Formula  Keri Lotion Fast Absorbing Softly Scented Dry Skin  Formula  Keri Original Lotion  Keri Skin Renewal Lotion Keri Silky Smooth Lotion  Keri Silky Smooth Sensitive Skin Lotion  Nivea Body Creamy Conditioning Oil  Nivea Body Extra Enriched Lotion  Nivea Body Original Lotion  Nivea Body Sheer Moisturizing Lotion Nivea Crme  Nivea Skin Firming Lotion  NutraDerm 30 Skin Lotion  NutraDerm Skin Lotion  NutraDerm Therapeutic Skin Cream  NutraDerm Therapeutic Skin Lotion  ProShield Protective Hand Cream  Provon moisturizing lotion   FAILURE TO FOLLOW THESE INSTRUCTIONS MAY RESULT IN THE CANCELLATION OF YOUR SURGERY  PATIENT SIGNATURE_________________________________  NURSE SIGNATURE__________________________________  ________________________________________________________________________       Antonio Nash    An incentive spirometer is a tool that can help keep your lungs clear and active. This tool measures how well you are filling your lungs with each breath. Taking long deep breaths may help reverse or decrease the chance of developing breathing (pulmonary) problems (especially infection) following: A long period of time when you are unable to move or be active. BEFORE THE PROCEDURE  If the spirometer includes an indicator to show your best effort, your nurse or respiratory therapist will set it to a  desired goal. If possible, sit up straight or lean slightly forward. Try not to slouch. Hold the incentive spirometer in an upright position. INSTRUCTIONS FOR USE  Sit on the edge of your bed if possible, or sit up as far as you can in bed or on a chair. Hold the incentive spirometer in an upright position. Breathe out normally. Place the mouthpiece in your mouth and seal your lips tightly around it. Breathe in slowly and as deeply as possible, raising the piston or the ball toward the top of the column. Hold your breath for 3-5 seconds or for as long as possible. Allow the piston or ball to fall to the bottom of the column. Remove the mouthpiece from your mouth and breathe out normally. Rest for a few seconds and repeat Steps 1 through 7 at least 10 times every 1-2 hours when you are awake. Take your time and take a few normal breaths between deep breaths. The spirometer may include an indicator to show your best effort. Use the indicator as a goal to work toward during each repetition. After each set of 10 deep breaths, practice coughing to be sure your lungs are clear. If you have an incision (the cut made at the time of surgery), support your incision when coughing by placing a pillow or rolled up towels firmly against it. Once you are able to get out of bed, walk around indoors and cough well. You may stop using the incentive spirometer when instructed by your caregiver.  RISKS AND COMPLICATIONS Take your time so you do not get dizzy or light-headed. If you are in pain, you may need to take or ask for pain medication before doing incentive spirometry. It is harder to take a deep breath if you are having pain. AFTER USE Rest and breathe slowly and easily. It can be helpful to keep track of a log of your progress. Your caregiver can provide you with a simple table to help with this. If you are using the spirometer at home, follow these instructions: SEEK MEDICAL CARE IF:  You are having  difficultly using the spirometer. You have trouble using the spirometer as often as instructed. Your pain medication is not giving enough relief while using the spirometer. You develop fever of 100.5 F (38.1 C) or higher.  SEEK IMMEDIATE MEDICAL CARE IF:  You cough up bloody sputum that had not been present before. You develop fever of 102 F (38.9 C) or greater. You develop worsening pain at or near the incision site. MAKE SURE YOU:  Understand these instructions. Will watch your condition. Will get help right away if you are not doing well or get worse. Document Released: 12/15/2006 Document Revised: 10/27/2011 Document Reviewed: 02/15/2007 [facility] Patient Information 2014 [place], [place].       WHAT IS A BLOOD TRANSFUSION? Blood Transfusion Information  A transfusion is the replacement of blood or some of its parts. Blood is made up of multiple cells which provide different functions. Red blood cells carry oxygen and are used for blood loss replacement. White blood cells fight against infection. Platelets control bleeding. Plasma helps clot blood. Other blood products are available for specialized needs, such as hemophilia or other clotting disorders. BEFORE THE TRANSFUSION  Who gives blood for transfusions?  Healthy volunteers who are fully evaluated to make sure their blood is safe. This is blood bank blood. Transfusion therapy is the safest it has ever been in the practice of medicine. Before blood is taken from a donor, a complete history is taken to make sure that person has no history of diseases nor engages in risky social behavior (examples are intravenous drug use or sexual activity with multiple partners). The donor's travel history is screened to minimize risk of transmitting infections, such as malaria. The donated blood is tested for signs of infectious diseases, such  as HIV and hepatitis. The blood is then tested to be sure it is compatible with you in order to minimize the chance of a transfusion reaction. If you or a relative donates blood, this is often done in anticipation of surgery and is not appropriate for emergency situations. It takes many days to process the donated blood. RISKS AND COMPLICATIONS Although transfusion therapy is very safe and saves many lives, the main dangers of transfusion include:  Getting an infectious disease. Developing a transfusion reaction. This is an allergic reaction to something in the blood you were given. Every precaution is taken to prevent this. The decision to have a blood transfusion has been considered carefully by your caregiver before blood is given. Blood is not given unless the benefits outweigh the risks. AFTER THE TRANSFUSION Right after receiving a blood transfusion, you will usually feel much better and more energetic. This is especially true if your red blood cells have gotten low (anemic). The transfusion raises the level of the red blood cells which carry oxygen, and this usually causes an energy increase. The nurse administering the transfusion will monitor you carefully for complications. HOME CARE INSTRUCTIONS  No special instructions are needed after a transfusion. You may find your energy is better. Speak with your caregiver about any limitations on activity for underlying diseases you may have. SEEK MEDICAL CARE IF:  Your condition is not improving after your transfusion. You develop redness or irritation at the intravenous (IV) site. SEEK IMMEDIATE MEDICAL CARE IF:  Any of the following symptoms occur over the next 12 hours: Shaking chills. You have a temperature by mouth above 102 F (38.9 C), not controlled by medicine. Chest, back, or muscle pain. People around you feel you are not acting correctly or are confused. Shortness of breath or difficulty breathing. Dizziness and fainting. You get  a rash or develop hives. You have a decrease in urine output. Your urine turns a dark color or changes to  pink, red, or brown. Any of the following symptoms occur over the next 10 days: You have a temperature by mouth above 102 F (38.9 C), not controlled by medicine. Shortness of breath. Weakness after normal activity. The white part of the eye turns yellow (jaundice). You have a decrease in the amount of urine or are urinating less often. Your urine turns a dark color or changes to pink, red, or brown. Document Released: 08/01/2000 Document Revised: 10/27/2011 Document Reviewed: 03/20/2008 [facility] Patient Information 2014 [place], [place].  _______________________________________________________________________          If you would like to see a video about joint replacement:   [email or web address]

## 2024-08-16 ENCOUNTER — Other Ambulatory Visit: Payer: Self-pay

## 2024-08-16 ENCOUNTER — Encounter (HOSPITAL_COMMUNITY)
Admission: RE | Admit: 2024-08-16 | Discharge: 2024-08-16 | Disposition: A | Discharge status: Home / Self Care | Source: Ambulatory Visit | Attending: Orthopaedic Surgery | Admitting: Orthopaedic Surgery

## 2024-08-16 ENCOUNTER — Encounter (HOSPITAL_COMMUNITY): Payer: Self-pay

## 2024-08-16 VITALS — BP 139/76 | HR 64 | Temp 98.3°F | Resp 20 | Ht 68.0 in | Wt 200.0 lb

## 2024-08-16 DIAGNOSIS — E119 Type 2 diabetes mellitus without complications: Secondary | ICD-10-CM | POA: Diagnosis not present

## 2024-08-16 DIAGNOSIS — Z01812 Encounter for preprocedural laboratory examination: Secondary | ICD-10-CM | POA: Diagnosis present

## 2024-08-16 DIAGNOSIS — M1611 Unilateral primary osteoarthritis, right hip: Secondary | ICD-10-CM | POA: Diagnosis not present

## 2024-08-16 DIAGNOSIS — Z01818 Encounter for other preprocedural examination: Secondary | ICD-10-CM

## 2024-08-16 DIAGNOSIS — I10 Essential (primary) hypertension: Secondary | ICD-10-CM | POA: Insufficient documentation

## 2024-08-16 LAB — BASIC METABOLIC PANEL WITH GFR
Anion gap: 11 (ref 5–15)
BUN: 16 mg/dL (ref 8–23)
CO2: 25 mmol/L (ref 22–32)
Calcium: 9.3 mg/dL (ref 8.9–10.3)
Chloride: 104 mmol/L (ref 98–111)
Creatinine, Ser: 0.94 mg/dL (ref 0.61–1.24)
GFR, Estimated: >60 mL/min
Glucose, Bld: 127 mg/dL — ABNORMAL HIGH (ref 70–99)
Potassium: 4.6 mmol/L (ref 3.5–5.1)
Sodium: 140 mmol/L (ref 135–145)

## 2024-08-16 LAB — SURGICAL PCR SCREEN
MRSA, PCR: NEGATIVE
Staphylococcus aureus: NEGATIVE

## 2024-08-16 LAB — CBC
HCT: 49.2 % (ref 39.0–52.0)
Hemoglobin: 15.6 g/dL (ref 13.0–17.0)
MCH: 27.9 pg (ref 26.0–34.0)
MCHC: 31.7 g/dL (ref 30.0–36.0)
MCV: 88 fL (ref 80.0–100.0)
Platelets: 257 K/uL (ref 150–400)
RBC: 5.59 MIL/uL (ref 4.22–5.81)
RDW: 13.5 % (ref 11.5–15.5)
WBC: 7.3 K/uL (ref 4.0–10.5)
nRBC: 0 % (ref 0.0–0.2)

## 2024-08-16 LAB — GLUCOSE, CAPILLARY: Glucose-Capillary: 128 mg/dL — ABNORMAL HIGH (ref 70–99)

## 2024-08-17 ENCOUNTER — Other Ambulatory Visit: Payer: Self-pay | Admitting: Physician Assistant

## 2024-08-17 MED ORDER — METHOCARBAMOL 750 MG PO TABS: 750.0000 mg | ORAL_TABLET | Freq: Three times a day (TID) | ORAL | 2 refills | Status: DC | PRN

## 2024-08-17 MED ORDER — OXYCODONE-ACETAMINOPHEN 5-325 MG PO TABS: 1.0000 | ORAL_TABLET | Freq: Three times a day (TID) | ORAL | 0 refills | Status: AC | PRN

## 2024-08-17 MED ORDER — DOXYCYCLINE HYCLATE 100 MG PO CAPS: 100.0000 mg | ORAL_CAPSULE | Freq: Two times a day (BID) | ORAL | 0 refills | Status: AC

## 2024-08-17 MED ORDER — DOCUSATE SODIUM 100 MG PO CAPS: 100.0000 mg | ORAL_CAPSULE | Freq: Every day | ORAL | 2 refills | Status: AC | PRN

## 2024-08-17 MED ORDER — ONDANSETRON HCL 4 MG PO TABS: 4.0000 mg | ORAL_TABLET | Freq: Three times a day (TID) | ORAL | 0 refills | Status: AC | PRN

## 2024-08-25 ENCOUNTER — Other Ambulatory Visit: Payer: Self-pay | Admitting: Physician Assistant

## 2024-08-25 MED ORDER — TRANEXAMIC ACID 1000 MG/10ML IV SOLN
2000.0000 mg | INTRAVENOUS | Status: DC
  Filled 2024-08-25: qty 20

## 2024-08-26 ENCOUNTER — Other Ambulatory Visit: Payer: Self-pay | Admitting: Physician Assistant

## 2024-08-26 ENCOUNTER — Other Ambulatory Visit (HOSPITAL_COMMUNITY): Payer: Self-pay

## 2024-08-26 ENCOUNTER — Ambulatory Visit (HOSPITAL_COMMUNITY): Admitting: Anesthesiology

## 2024-08-26 ENCOUNTER — Other Ambulatory Visit: Payer: Self-pay

## 2024-08-26 ENCOUNTER — Observation Stay (HOSPITAL_COMMUNITY)
Admission: RE | Admit: 2024-08-26 | Discharge: 2024-08-27 | Disposition: A | Attending: Orthopaedic Surgery | Admitting: Orthopaedic Surgery

## 2024-08-26 ENCOUNTER — Ambulatory Visit (HOSPITAL_COMMUNITY)

## 2024-08-26 ENCOUNTER — Observation Stay (HOSPITAL_COMMUNITY)

## 2024-08-26 ENCOUNTER — Encounter (HOSPITAL_COMMUNITY): Admission: RE | Payer: Self-pay | Source: Home / Self Care

## 2024-08-26 ENCOUNTER — Encounter (HOSPITAL_COMMUNITY): Payer: Self-pay | Admitting: Orthopaedic Surgery

## 2024-08-26 DIAGNOSIS — M21751 Unequal limb length (acquired), right femur: Secondary | ICD-10-CM | POA: Insufficient documentation

## 2024-08-26 DIAGNOSIS — Z96642 Presence of left artificial hip joint: Secondary | ICD-10-CM | POA: Insufficient documentation

## 2024-08-26 DIAGNOSIS — I10 Essential (primary) hypertension: Secondary | ICD-10-CM | POA: Insufficient documentation

## 2024-08-26 DIAGNOSIS — Z79899 Other long term (current) drug therapy: Secondary | ICD-10-CM | POA: Insufficient documentation

## 2024-08-26 DIAGNOSIS — Z96641 Presence of right artificial hip joint: Secondary | ICD-10-CM

## 2024-08-26 DIAGNOSIS — M1611 Unilateral primary osteoarthritis, right hip: Secondary | ICD-10-CM | POA: Diagnosis not present

## 2024-08-26 DIAGNOSIS — E119 Type 2 diabetes mellitus without complications: Secondary | ICD-10-CM | POA: Insufficient documentation

## 2024-08-26 DIAGNOSIS — Z01818 Encounter for other preprocedural examination: Secondary | ICD-10-CM

## 2024-08-26 HISTORY — PX: TOTAL HIP ARTHROPLASTY: SHX124

## 2024-08-26 LAB — GLUCOSE, CAPILLARY
Glucose-Capillary: 201 mg/dL — ABNORMAL HIGH (ref 70–99)
Glucose-Capillary: 162 mg/dL — ABNORMAL HIGH (ref 70–99)
Glucose-Capillary: 226 mg/dL — ABNORMAL HIGH (ref 70–99)
Glucose-Capillary: 171 mg/dL — ABNORMAL HIGH (ref 70–99)
Glucose-Capillary: 162 mg/dL — ABNORMAL HIGH (ref 70–99)

## 2024-08-26 LAB — TYPE AND SCREEN
ABO/RH(D): O POS
Antibody Screen: NEGATIVE

## 2024-08-26 LAB — HEMOGLOBIN A1C
Hgb A1c MFr Bld: 7 % — ABNORMAL HIGH (ref 4.8–5.6)
Mean Plasma Glucose: 154.2 mg/dL

## 2024-08-26 SURGERY — ARTHROPLASTY, HIP, TOTAL, ANTERIOR APPROACH
Anesthesia: General | Site: Hip | Laterality: Right

## 2024-08-26 MED ORDER — ACETAMINOPHEN 325 MG PO TABS: 325.0000 mg | ORAL_TABLET | Freq: Four times a day (QID) | ORAL | Status: DC | PRN

## 2024-08-26 MED ORDER — APIXABAN 2.5 MG PO TABS
2.5000 mg | ORAL_TABLET | Freq: Two times a day (BID) | ORAL | Status: DC
  Administered 2024-08-27: 2.5 mg via ORAL
  Filled 2024-08-26: qty 1

## 2024-08-26 MED ORDER — DIPHENHYDRAMINE HCL 12.5 MG/5ML PO ELIX: 25.0000 mg | ORAL_SOLUTION | ORAL | Status: DC | PRN

## 2024-08-26 MED ORDER — ALUM & MAG HYDROXIDE-SIMETH 200-200-20 MG/5ML PO SUSP: 30.0000 mL | ORAL | Status: DC | PRN

## 2024-08-26 MED ORDER — ACETAMINOPHEN 500 MG PO TABS
1000.0000 mg | ORAL_TABLET | Freq: Four times a day (QID) | ORAL | Status: DC
  Administered 2024-08-26 – 2024-08-27 (×3): 1000 mg via ORAL
  Filled 2024-08-26 (×3): qty 2

## 2024-08-26 MED ORDER — FENTANYL CITRATE (PF) 100 MCG/2ML IJ SOLN
INTRAMUSCULAR | Status: AC
  Filled 2024-08-26: qty 2

## 2024-08-26 MED ORDER — MIDAZOLAM HCL 5 MG/5ML IJ SOLN
INTRAMUSCULAR | Status: DC | PRN
  Administered 2024-08-26: 2 mg via INTRAVENOUS

## 2024-08-26 MED ORDER — OXYCODONE HCL 5 MG/5ML PO SOLN: 5.0000 mg | Freq: Once | ORAL | Status: DC | PRN

## 2024-08-26 MED ORDER — METHOCARBAMOL 1000 MG/10ML IJ SOLN: 500.0000 mg | Freq: Four times a day (QID) | INTRAMUSCULAR | Status: DC | PRN

## 2024-08-26 MED ORDER — INSULIN ASPART 100 UNIT/ML IJ SOLN
0.0000 [IU] | INTRAMUSCULAR | Status: DC | PRN
  Administered 2024-08-26: 2 [IU] via SUBCUTANEOUS

## 2024-08-26 MED ORDER — PROPOFOL 1000 MG/100ML IV EMUL
INTRAVENOUS | Status: AC
  Filled 2024-08-26: qty 100

## 2024-08-26 MED ORDER — ONDANSETRON HCL 4 MG/2ML IJ SOLN: 4.0000 mg | Freq: Once | INTRAMUSCULAR | Status: DC | PRN

## 2024-08-26 MED ORDER — GLIMEPIRIDE 4 MG PO TABS
4.0000 mg | ORAL_TABLET | Freq: Every day | ORAL | Status: DC
  Administered 2024-08-27: 4 mg via ORAL
  Filled 2024-08-26: qty 1

## 2024-08-26 MED ORDER — AMISULPRIDE (ANTIEMETIC) 5 MG/2ML IV SOLN: 10.0000 mg | Freq: Once | INTRAVENOUS | Status: DC | PRN

## 2024-08-26 MED ORDER — INSULIN ASPART 100 UNIT/ML IJ SOLN: 0.0000 [IU] | Freq: Every day | INTRAMUSCULAR | Status: DC

## 2024-08-26 MED ORDER — PROPOFOL 10 MG/ML IV BOLUS
INTRAVENOUS | Status: DC | PRN
  Administered 2024-08-26: 150 mg via INTRAVENOUS

## 2024-08-26 MED ORDER — EPHEDRINE 5 MG/ML INJ
INTRAVENOUS | Status: AC
  Filled 2024-08-26: qty 5

## 2024-08-26 MED ORDER — OXYCODONE HCL 5 MG PO TABS: 5.0000 mg | ORAL_TABLET | Freq: Once | ORAL | Status: DC | PRN

## 2024-08-26 MED ORDER — VANCOMYCIN HCL 1 G IV SOLR
INTRAVENOUS | Status: DC | PRN
  Administered 2024-08-26: 1000 mg

## 2024-08-26 MED ORDER — CEFAZOLIN SODIUM-DEXTROSE 2-4 GM/100ML-% IV SOLN
2.0000 g | INTRAVENOUS | Status: AC
  Administered 2024-08-26: 2 g via INTRAVENOUS
  Filled 2024-08-26: qty 100

## 2024-08-26 MED ORDER — 0.9 % SODIUM CHLORIDE (POUR BTL) OPTIME
TOPICAL | Status: DC | PRN
  Administered 2024-08-26: 1000 mL

## 2024-08-26 MED ORDER — CEFAZOLIN SODIUM-DEXTROSE 2-4 GM/100ML-% IV SOLN
2.0000 g | Freq: Four times a day (QID) | INTRAVENOUS | Status: AC
  Administered 2024-08-26 (×2): 2 g via INTRAVENOUS
  Filled 2024-08-26 (×2): qty 100

## 2024-08-26 MED ORDER — INSULIN ASPART 100 UNIT/ML IJ SOLN
INTRAMUSCULAR | Status: AC
  Filled 2024-08-26: qty 2

## 2024-08-26 MED ORDER — LACTATED RINGERS IV SOLN: INTRAVENOUS | Status: DC

## 2024-08-26 MED ORDER — TRANEXAMIC ACID-NACL 1000-0.7 MG/100ML-% IV SOLN
1000.0000 mg | Freq: Once | INTRAVENOUS | Status: AC
  Administered 2024-08-26: 1000 mg via INTRAVENOUS
  Filled 2024-08-26: qty 100

## 2024-08-26 MED ORDER — TRANEXAMIC ACID 1000 MG/10ML IV SOLN
INTRAVENOUS | Status: DC | PRN
  Administered 2024-08-26: 2000 mg via TOPICAL

## 2024-08-26 MED ORDER — CARVEDILOL 12.5 MG PO TABS
12.5000 mg | ORAL_TABLET | Freq: Two times a day (BID) | ORAL | Status: DC
  Administered 2024-08-26 – 2024-08-27 (×2): 12.5 mg via ORAL
  Filled 2024-08-26 (×2): qty 1

## 2024-08-26 MED ORDER — CHLORHEXIDINE GLUCONATE 0.12 % MT SOLN
15.0000 mL | Freq: Once | OROMUCOSAL | Status: AC
  Administered 2024-08-26: 15 mL via OROMUCOSAL

## 2024-08-26 MED ORDER — FENTANYL CITRATE (PF) 100 MCG/2ML IJ SOLN
INTRAMUSCULAR | Status: DC | PRN
  Administered 2024-08-26 (×2): 50 ug via INTRAVENOUS

## 2024-08-26 MED ORDER — DOCUSATE SODIUM 100 MG PO CAPS
100.0000 mg | ORAL_CAPSULE | Freq: Two times a day (BID) | ORAL | Status: DC
  Administered 2024-08-26 – 2024-08-27 (×3): 100 mg via ORAL
  Filled 2024-08-26 (×3): qty 1

## 2024-08-26 MED ORDER — BUPIVACAINE-MELOXICAM ER 400-12 MG/14ML IJ SOLN
INTRAMUSCULAR | Status: AC
  Filled 2024-08-26: qty 1

## 2024-08-26 MED ORDER — HYDROMORPHONE HCL 2 MG/ML IJ SOLN
INTRAMUSCULAR | Status: AC
  Filled 2024-08-26: qty 1

## 2024-08-26 MED ORDER — ISOPROPYL ALCOHOL 70 % SOLN
Status: AC
  Filled 2024-08-26: qty 480

## 2024-08-26 MED ORDER — SORBITOL 70 % SOLN: 30.0000 mL | Freq: Every day | Status: DC | PRN

## 2024-08-26 MED ORDER — ROCURONIUM BROMIDE 10 MG/ML (PF) SYRINGE
PREFILLED_SYRINGE | INTRAVENOUS | Status: AC
  Filled 2024-08-26: qty 10

## 2024-08-26 MED ORDER — POVIDONE-IODINE 10 % EX SWAB: 2.0000 | Freq: Once | CUTANEOUS | Status: DC

## 2024-08-26 MED ORDER — LIDOCAINE HCL (PF) 2 % IJ SOLN
INTRAMUSCULAR | Status: AC
  Filled 2024-08-26: qty 5

## 2024-08-26 MED ORDER — BUPIVACAINE-MELOXICAM ER 200-6 MG/7ML IJ SOLN
INTRAMUSCULAR | Status: DC | PRN
  Administered 2024-08-26: 60 mg

## 2024-08-26 MED ORDER — DEXAMETHASONE SOD PHOSPHATE PF 10 MG/ML IJ SOLN
INTRAMUSCULAR | Status: AC
  Filled 2024-08-26: qty 1

## 2024-08-26 MED ORDER — ONDANSETRON HCL 4 MG/2ML IJ SOLN
4.0000 mg | Freq: Four times a day (QID) | INTRAMUSCULAR | Status: DC | PRN
  Administered 2024-08-26: 4 mg via INTRAVENOUS
  Filled 2024-08-26: qty 2

## 2024-08-26 MED ORDER — PHENOL 1.4 % MT LIQD: 1.0000 | OROMUCOSAL | Status: DC | PRN

## 2024-08-26 MED ORDER — PROPOFOL 10 MG/ML IV BOLUS
INTRAVENOUS | Status: AC
  Filled 2024-08-26: qty 20

## 2024-08-26 MED ORDER — ROCURONIUM BROMIDE 10 MG/ML (PF) SYRINGE
PREFILLED_SYRINGE | INTRAVENOUS | Status: DC | PRN
  Administered 2024-08-26: 60 mg via INTRAVENOUS

## 2024-08-26 MED ORDER — ORAL CARE MOUTH RINSE: 15.0000 mL | Freq: Once | OROMUCOSAL | Status: AC

## 2024-08-26 MED ORDER — MENTHOL 3 MG MT LOZG: 1.0000 | LOZENGE | OROMUCOSAL | Status: DC | PRN

## 2024-08-26 MED ORDER — HYDROCODONE-ACETAMINOPHEN 5-325 MG PO TABS: 1.0000 | ORAL_TABLET | Freq: Three times a day (TID) | ORAL | Status: DC | PRN

## 2024-08-26 MED ORDER — LIDOCAINE HCL (PF) 2 % IJ SOLN
INTRAMUSCULAR | Status: DC | PRN
  Administered 2024-08-26: 60 mg via INTRADERMAL

## 2024-08-26 MED ORDER — SUGAMMADEX SODIUM 200 MG/2ML IV SOLN
INTRAVENOUS | Status: AC
  Filled 2024-08-26: qty 2

## 2024-08-26 MED ORDER — EMPAGLIFLOZIN 10 MG PO TABS
10.0000 mg | ORAL_TABLET | Freq: Every day | ORAL | Status: DC
  Administered 2024-08-26 – 2024-08-27 (×2): 10 mg via ORAL
  Filled 2024-08-26 (×2): qty 1

## 2024-08-26 MED ORDER — ONDANSETRON HCL 4 MG/2ML IJ SOLN
INTRAMUSCULAR | Status: DC | PRN
  Administered 2024-08-26: 4 mg via INTRAVENOUS

## 2024-08-26 MED ORDER — ONDANSETRON HCL 4 MG/2ML IJ SOLN
INTRAMUSCULAR | Status: AC
  Filled 2024-08-26: qty 2

## 2024-08-26 MED ORDER — MORPHINE SULFATE (PF) 2 MG/ML IV SOLN: 0.5000 mg | Freq: Four times a day (QID) | INTRAVENOUS | Status: DC | PRN

## 2024-08-26 MED ORDER — SUGAMMADEX SODIUM 200 MG/2ML IV SOLN
INTRAVENOUS | Status: DC | PRN
  Administered 2024-08-26: 200 mg via INTRAVENOUS

## 2024-08-26 MED ORDER — DOXYCYCLINE HYCLATE 100 MG PO TABS
100.0000 mg | ORAL_TABLET | Freq: Two times a day (BID) | ORAL | Status: DC
  Administered 2024-08-26 – 2024-08-27 (×3): 100 mg via ORAL
  Filled 2024-08-26 (×3): qty 1

## 2024-08-26 MED ORDER — LINAGLIPTIN 5 MG PO TABS
5.0000 mg | ORAL_TABLET | Freq: Every day | ORAL | Status: DC
  Administered 2024-08-26 – 2024-08-27 (×2): 5 mg via ORAL
  Filled 2024-08-26 (×2): qty 1

## 2024-08-26 MED ORDER — AMLODIPINE BESYLATE 10 MG PO TABS
10.0000 mg | ORAL_TABLET | Freq: Every day | ORAL | Status: DC
  Administered 2024-08-27: 10 mg via ORAL
  Filled 2024-08-26: qty 1

## 2024-08-26 MED ORDER — HYDROCODONE-ACETAMINOPHEN 7.5-325 MG PO TABS
1.0000 | ORAL_TABLET | Freq: Three times a day (TID) | ORAL | Status: DC | PRN
  Administered 2024-08-26 – 2024-08-27 (×2): 1 via ORAL
  Filled 2024-08-26 (×2): qty 1
  Filled 2024-08-26: qty 2

## 2024-08-26 MED ORDER — SODIUM CHLORIDE 0.9 % IR SOLN
Status: DC | PRN
  Administered 2024-08-26: 3000 mL

## 2024-08-26 MED ORDER — METOCLOPRAMIDE HCL 5 MG/ML IJ SOLN: 5.0000 mg | Freq: Three times a day (TID) | INTRAMUSCULAR | Status: DC | PRN

## 2024-08-26 MED ORDER — HYDROMORPHONE HCL 1 MG/ML IJ SOLN
INTRAMUSCULAR | Status: DC | PRN
  Administered 2024-08-26 (×2): 1 mg via INTRAVENOUS

## 2024-08-26 MED ORDER — METOCLOPRAMIDE HCL 5 MG PO TABS: 5.0000 mg | ORAL_TABLET | Freq: Three times a day (TID) | ORAL | Status: DC | PRN

## 2024-08-26 MED ORDER — PHENYLEPHRINE 80 MCG/ML (10ML) SYRINGE FOR IV PUSH (FOR BLOOD PRESSURE SUPPORT)
PREFILLED_SYRINGE | INTRAVENOUS | Status: DC | PRN
  Administered 2024-08-26: 80 ug via INTRAVENOUS

## 2024-08-26 MED ORDER — MIDAZOLAM HCL 2 MG/2ML IJ SOLN
INTRAMUSCULAR | Status: AC
  Filled 2024-08-26: qty 2

## 2024-08-26 MED ORDER — HYDROMORPHONE HCL 1 MG/ML IJ SOLN
0.2500 mg | INTRAMUSCULAR | Status: DC | PRN
  Administered 2024-08-26: 0.25 mg via INTRAVENOUS
  Administered 2024-08-26: 0.5 mg via INTRAVENOUS

## 2024-08-26 MED ORDER — ISOPROPYL ALCOHOL 70 % SOLN
Status: DC | PRN
  Administered 2024-08-26: 1 via TOPICAL

## 2024-08-26 MED ORDER — TRANEXAMIC ACID-NACL 1000-0.7 MG/100ML-% IV SOLN
1000.0000 mg | INTRAVENOUS | Status: AC
  Administered 2024-08-26: 1000 mg via INTRAVENOUS
  Filled 2024-08-26: qty 100

## 2024-08-26 MED ORDER — ONDANSETRON HCL 4 MG PO TABS: 4.0000 mg | ORAL_TABLET | Freq: Four times a day (QID) | ORAL | Status: DC | PRN

## 2024-08-26 MED ORDER — INSULIN ASPART 100 UNIT/ML IJ SOLN
0.0000 [IU] | Freq: Three times a day (TID) | INTRAMUSCULAR | Status: DC
  Administered 2024-08-26: 3 [IU] via SUBCUTANEOUS
  Administered 2024-08-27: 2 [IU] via SUBCUTANEOUS
  Filled 2024-08-26: qty 3
  Filled 2024-08-26: qty 2

## 2024-08-26 MED ORDER — PANTOPRAZOLE SODIUM 40 MG PO TBEC
40.0000 mg | DELAYED_RELEASE_TABLET | Freq: Every day | ORAL | Status: DC
  Administered 2024-08-26 – 2024-08-27 (×2): 40 mg via ORAL
  Filled 2024-08-26 (×2): qty 1

## 2024-08-26 MED ORDER — HYDROMORPHONE HCL 1 MG/ML IJ SOLN
INTRAMUSCULAR | Status: AC
  Filled 2024-08-26: qty 1

## 2024-08-26 MED ORDER — VANCOMYCIN HCL 1000 MG IV SOLR
INTRAVENOUS | Status: AC
  Filled 2024-08-26: qty 20

## 2024-08-26 MED ORDER — APIXABAN 2.5 MG PO TABS
ORAL_TABLET | ORAL | 0 refills | Status: DC
  Filled 2024-08-26: qty 60, 30d supply, fill #0

## 2024-08-26 MED ORDER — POLYETHYLENE GLYCOL 3350 17 G PO PACK
17.0000 g | PACK | Freq: Every day | ORAL | Status: DC
  Administered 2024-08-26: 17 g via ORAL
  Filled 2024-08-26 (×2): qty 1

## 2024-08-26 MED ORDER — DEXAMETHASONE SOD PHOSPHATE PF 10 MG/ML IJ SOLN
10.0000 mg | Freq: Once | INTRAMUSCULAR | Status: AC
  Administered 2024-08-27: 10 mg via INTRAVENOUS
  Filled 2024-08-26: qty 1

## 2024-08-26 MED ORDER — ACETAMINOPHEN 500 MG PO TABS
1000.0000 mg | ORAL_TABLET | Freq: Once | ORAL | Status: DC
  Filled 2024-08-26: qty 2

## 2024-08-26 MED ORDER — DEXAMETHASONE SOD PHOSPHATE PF 10 MG/ML IJ SOLN
INTRAMUSCULAR | Status: DC | PRN
  Administered 2024-08-26: 10 mg via INTRAVENOUS

## 2024-08-26 MED ORDER — MAGNESIUM CITRATE PO SOLN: 1.0000 | Freq: Once | ORAL | Status: DC | PRN

## 2024-08-26 MED ORDER — RIVAROXABAN 10 MG PO TABS: 10.0000 mg | ORAL_TABLET | Freq: Every day | ORAL | Status: DC

## 2024-08-26 MED ORDER — METHOCARBAMOL 500 MG PO TABS
500.0000 mg | ORAL_TABLET | Freq: Four times a day (QID) | ORAL | Status: DC | PRN
  Administered 2024-08-26: 500 mg via ORAL
  Filled 2024-08-26: qty 1

## 2024-08-26 MED ORDER — SODIUM CHLORIDE 0.9 % IV SOLN: INTRAVENOUS | Status: DC

## 2024-08-26 SURGICAL SUPPLY — 43 items
BAG COUNTER SPONGE SURGICOUNT (BAG) IMPLANT
BAG ZIPLOCK 12X15 (MISCELLANEOUS) ×1 IMPLANT
BLADE SAG 18X100X1.27 (BLADE) ×1 IMPLANT
CLSR STERI-STRIP ANTIMIC 1/2X4 (GAUZE/BANDAGES/DRESSINGS) IMPLANT
COOLER ICEMAN CLASSIC (MISCELLANEOUS) IMPLANT
COVER PERINEAL POST (MISCELLANEOUS) ×1 IMPLANT
COVER SURGICAL LIGHT HANDLE (MISCELLANEOUS) ×1 IMPLANT
CUP ACETAB W/GRIPTION 54 (Plate) IMPLANT
DRAPE FOOT SWITCH (DRAPES) ×1 IMPLANT
DRAPE IMP U-DRAPE 54X76 (DRAPES) ×1 IMPLANT
DRAPE POUCH INSTRU U-SHP 10X18 (DRAPES) ×1 IMPLANT
DRAPE STERI IOBAN 125X83 (DRAPES) ×1 IMPLANT
DRAPE TOP 10253 STERILE (DRAPES) ×2 IMPLANT
DRAPE U-SHAPE 47X51 STRL (DRAPES) ×1 IMPLANT
DRSG AQUACEL AG ADV 3.5X10 (GAUZE/BANDAGES/DRESSINGS) ×1 IMPLANT
DURAPREP 26ML APPLICATOR (WOUND CARE) ×2 IMPLANT
ELECT PENCIL ROCKER SW 15FT (MISCELLANEOUS) ×1 IMPLANT
ELECT REM PT RETURN 15FT ADLT (MISCELLANEOUS) ×1 IMPLANT
GLOVE BIOGEL PI IND STRL 7.0 (GLOVE) ×1 IMPLANT
GLOVE BIOGEL PI IND STRL 7.5 (GLOVE) ×1 IMPLANT
GLOVE ECLIPSE 7.0 STRL STRAW (GLOVE) ×3 IMPLANT
GLOVE SURG SYN 7.5 PF PI (GLOVE) ×3 IMPLANT
GOWN SRG XL LVL 4 BRTHBL STRL (GOWNS) ×2 IMPLANT
HEAD CERAMIC 36 PLUS5 (Hips) IMPLANT
HOOD PEEL AWAY T7 (MISCELLANEOUS) ×3 IMPLANT
KIT TURNOVER KIT A (KITS) ×1 IMPLANT
LINER NEUTRAL 54X36MM PLUS 4 (Hips) IMPLANT
MARKER SKIN DUAL TIP RULER LAB (MISCELLANEOUS) ×1 IMPLANT
NEEDLE SPNL 18GX3.5 QUINCKE PK (NEEDLE) ×1 IMPLANT
PACK ANTERIOR HIP CUSTOM (KITS) ×1 IMPLANT
PAD COLD SHLDR WRAP-ON (PAD) IMPLANT
SCREW 6.5MMX25MM (Screw) IMPLANT
SET HNDPC FAN SPRY TIP SCT (DISPOSABLE) ×1 IMPLANT
SOLUTION PRONTOSAN WOUND 350ML (IRRIGATION / IRRIGATOR) ×1 IMPLANT
STEM FEM ACTIS HIGH SZ8 (Stem) IMPLANT
SUT ETHIBOND 2 V 37 (SUTURE) ×1 IMPLANT
SUT ETHILON 2 0 PS N (SUTURE) ×1 IMPLANT
SUT MNCRL AB 3-0 PS2 18 (SUTURE) IMPLANT
SUT NYLON 3 0 (SUTURE) IMPLANT
SUT STRATAFIX PDS+ 0 24IN (SUTURE) ×1 IMPLANT
SUT VIC AB 2-0 CT1 TAPERPNT 27 (SUTURE) ×2 IMPLANT
TRAY CATH INTERMITTENT SS 16FR (CATHETERS) IMPLANT
TUBE SUCTION HIGH CAP CLEAR NV (SUCTIONS) ×1 IMPLANT

## 2024-08-26 NOTE — Transfer of Care (Signed)
 Immediate Anesthesia Transfer of Care Note  Patient: Antonio Nash  Procedure(s) Performed: ARTHROPLASTY, HIP, TOTAL, ANTERIOR APPROACH (Right: Hip)  Patient Location: PACU  Anesthesia Type:General  Level of Consciousness: awake, alert , and oriented  Airway & Oxygen Therapy: Patient Spontanous Breathing and Patient connected to face mask oxygen  Post-op Assessment: Report given to RN and Post -op Vital signs reviewed and stable  Post vital signs: Reviewed and stable  Last Vitals:  Vitals Value Taken Time  BP    Temp    Pulse 71 08/26/24 09:22  Resp 19 08/26/24 09:22  SpO2 100 % 08/26/24 09:22  Vitals shown include unfiled device data.  Last Pain:  Vitals:   08/26/24 0608  TempSrc: Oral  PainSc:          Complications: No notable events documented.

## 2024-08-26 NOTE — Discharge Instructions (Addendum)

## 2024-08-26 NOTE — Anesthesia Procedure Notes (Signed)
 Procedure Name: Intubation Date/Time: 08/26/2024 7:39 AM  Performed by: Nickolas Chalfin D, CRNAPre-anesthesia Checklist: Patient identified, Emergency Drugs available, Suction available and Patient being monitored Patient Re-evaluated:Patient Re-evaluated prior to induction Oxygen Delivery Method: Circle system utilized Preoxygenation: Pre-oxygenation with 100% oxygen Induction Type: IV induction Ventilation: Mask ventilation without difficulty Laryngoscope Size: Mac and 4 Tube type: Oral Tube size: 7.5 mm Number of attempts: 1 Airway Equipment and Method: Stylet and Oral airway Placement Confirmation: ETT inserted through vocal cords under direct vision, positive ETCO2 and breath sounds checked- equal and bilateral Secured at: 22 cm Tube secured with: Tape Dental Injury: Teeth and Oropharynx as per pre-operative assessment

## 2024-08-26 NOTE — Evaluation (Signed)
 Physical Therapy Evaluation Patient Details Name: Antonio Nash MRN: [id number] DOB: 1956/06/28 Today's Date: 08/26/2024  History of Present Illness  [name] and with hx of L THR (22), DM< and PE  Clinical Impression  [name] and presents with decreased R LE strength/ROM and post op pain limiting functional mobility.  Pt should progress well to dc home with family assist.        If plan is discharge home, recommend the following: A little help with walking and/or transfers;A little help with bathing/dressing/bathroom;Assistance with cooking/housework;Assist for transportation   Can travel by private vehicle        Equipment Recommendations None recommended by PT  Recommendations for Other Services       Functional Status Assessment Patient has had a recent decline in their functional status and demonstrates the ability to make significant improvements in function in a reasonable and predictable amount of time.     Precautions / Restrictions Precautions Precautions: Fall Restrictions Weight Bearing Restrictions Per Provider Order: Yes RLE Weight Bearing Per Provider Order: Weight bearing as tolerated      Mobility  Bed Mobility Overal bed mobility: Needs Assistance Bed Mobility: Supine to Sit     Supine to sit: Min assist     General bed mobility comments: cues for sequence and use of  L LE to self assist    Transfers Overall transfer level: Needs assistance Equipment used: Rolling walker (2 wheels) Transfers: Sit to/from Stand Sit to Stand: Min assist           General transfer comment: cues for LE management and use of UEs to self assist    Ambulation/Gait Ambulation/Gait assistance: Min assist Gait Distance (Feet): 74 Feet Assistive device: Rolling walker (2 wheels) Gait Pattern/deviations: Step-to pattern, Step-through pattern, Decreased step length - right, Decreased step length - left, Shuffle Gait velocity: dec     General Gait  Details: cues for posture and position from [employer]            Wheelchair Mobility     Tilt Bed    Modified Rankin (Stroke Patients Only)       Balance Overall balance assessment: Needs assistance Sitting-balance support: No upper extremity supported, Feet supported Sitting balance-Leahy Scale: Good     Standing balance support: Bilateral upper extremity supported Standing balance-Leahy Scale: Poor                               Pertinent Vitals/Pain Pain Assessment Pain Assessment: 0-10 Pain Score: 7  Pain Location: R hip Pain Descriptors / Indicators: Aching, Sore Pain Intervention(s): Limited activity within patient's tolerance, Monitored during session, Premedicated before session, Ice applied, Patient requesting pain meds-RN notified    Home Living Family/patient expects to be discharged to:: Private residence Living Arrangements: Spouse/significant other Available Help at Discharge: Family Type of Home: House Home Access: Stairs to enter Entrance Stairs-Rails: Right Entrance Stairs-Number of Steps: 3   Home Layout: One level Home Equipment: Agricultural [employer]ant (2 wheels)      Prior Function Prior Level of Function : Independent/Modified Independent                     Extremity/Trunk Assessment   Upper Extremity Assessment Upper Extremity Assessment: Overall WFL for tasks assessed    Lower Extremity Assessment Lower Extremity Assessment: RLE deficits/detail    Cervical / Trunk Assessment Cervical / Trunk Assessment:  Normal  Communication   Communication Communication: No apparent difficulties    Cognition Arousal: Alert Behavior During Therapy: WFL for tasks assessed/performed   PT - Cognitive impairments: No apparent impairments                         Following commands: Intact       Cueing Cueing Techniques: Verbal cues     General Comments      Exercises Total Joint Exercises Ankle  Circles/Pumps: AROM, 15 reps, Supine   Assessment/Plan    PT Assessment Patient needs continued PT services  PT Problem List Decreased strength;Decreased range of motion;Decreased activity tolerance;Decreased balance;Decreased mobility;Decreased knowledge of use of DME       PT Treatment Interventions DME instruction;Gait training;Stair training;Functional mobility training;Therapeutic activities;Therapeutic exercise;Patient/family education    PT Goals (Current goals can be found in the Care Plan section)  Acute Rehab PT Goals Patient Stated Goal: Regain IND PT Goal Formulation: With patient Time For Goal Achievement: 09/09/24 Potential to Achieve Goals: Good    Frequency 7X/week     Co-evaluation               AM-PAC PT 6 Clicks Mobility  Outcome Measure Help needed turning from your back to your side while in a flat bed without using bedrails?: A Little Help needed moving from lying on your back to sitting on the side of a flat bed without using bedrails?: A Little Help needed moving to and from a bed to a chair (including a wheelchair)?: A Little Help needed standing up from a chair using your arms (e.g., wheelchair or bedside chair)?: A Little Help needed to walk in hospital room?: A Little Help needed climbing 3-5 steps with a railing? : A Lot 6 Click Score: 17    End of Session Equipment Utilized During Treatment: Gait belt Activity Tolerance: Patient tolerated treatment well Patient left: in chair;with call bell/phone within reach;with chair alarm set;with family/visitor present Nurse Communication: Mobility status PT Visit Diagnosis: Difficulty in walking, not elsewhere classified (R26.2)    Time: 8642-8581 PT Time Calculation (min) (ACUTE ONLY): 21 min   Charges:   PT Evaluation $PT Eval Low Complexity: 1 Low   PT General Charges $$ ACUTE PT VISIT: 1 Visit         [facility] PT Acute Rehabilitation Services Office  [phone]   Youa Deloney 08/26/2024, 2:36 PM

## 2024-08-26 NOTE — Anesthesia Preprocedure Evaluation (Addendum)
"                                    Anesthesia Evaluation  Patient identified by MRN, date of birth, ID band Patient awake    Reviewed: Allergy & Precautions, H&P , NPO status , Patient's Chart, lab work & pertinent test results, reviewed documented beta blocker date and time   Airway Mallampati: III  TM Distance: >3 FB Neck ROM: Full    Dental  (+) Teeth Intact, Dental Advisory Given   Pulmonary PE (2022)   Pulmonary exam normal breath sounds clear to auscultation       Cardiovascular hypertension (147/85 preop), Pt. on medications and Pt. on home beta blockers Normal cardiovascular exam Rhythm:Regular Rate:Normal     Neuro/Psych negative neurological ROS  negative psych ROS   GI/Hepatic negative GI ROS, Neg liver ROS,,,  Endo/Other  diabetes, Well Controlled, Type 2, Oral Hypoglycemic Agents  BMI 30 2 units insulin  this AM  Renal/GU negative Renal ROS  negative genitourinary   Musculoskeletal  (+) Arthritis , Osteoarthritis,    Abdominal   Peds negative pediatric ROS (+)  Hematology negative hematology ROS (+) Hb 15.6, plt 257   Anesthesia Other Findings   Reproductive/Obstetrics negative OB ROS                              Anesthesia Physical Anesthesia Plan  ASA: 2  Anesthesia Plan: General   Post-op Pain Management: Tylenol  PO (pre-op)*   Induction:   PONV Risk Score and Plan: 2 and Ondansetron , Dexamethasone , Treatment may vary due to age or medical condition and Midazolam   Airway Management Planned: Oral ETT  Additional Equipment: None  Intra-op Plan:   Post-operative Plan: Extubation in OR  Informed Consent: I have reviewed the patients History and Physical, chart, labs and discussed the procedure including the risks, benefits and alternatives for the proposed anesthesia with the patient or authorized representative who has indicated his/her understanding and acceptance.     Dental advisory  given  Plan Discussed with: CRNA  Anesthesia Plan Comments: (2022 THR attempted spinal, unable to place)         Anesthesia Quick Evaluation  "

## 2024-08-26 NOTE — H&P (Signed)
 "   PREOPERATIVE H&P  Chief Complaint: right hip osteoarthritis  HPI: Antonio Nash is a 69 y.o. male who presents for surgical treatment of right hip osteoarthritis.  He denies any changes in medical history.  Past Surgical History:  Procedure Laterality Date   COLONOSCOPY     HERNIA REPAIR Right    inguinal hernia   KNEE SURGERY Right 2005   arthroscopy w/ debridement   ROTATOR CUFF REPAIR Right 2004   arthroscopy   TOTAL HIP ARTHROPLASTY Left 05/06/2021   Procedure: LEFT TOTAL HIP ARTHROPLASTY ANTERIOR APPROACH;  Surgeon: Jerri Kay HERO, MD;  Location: [facility] OR;  Service: Orthopedics;  Laterality: Left;   Social History   Socioeconomic History   Marital status: Married    Spouse name: Not on file   Number of children: 4   Years of education: Not on file   Highest education level: Not on file  Occupational History   Not on file  Tobacco Use   Smoking status: Never   Smokeless tobacco: Never  Vaping Use   Vaping status: Never Used  Substance and Sexual Activity   Alcohol  use: Yes    Comment: 1 shot a day   Drug use: Never   Sexual activity: Yes  Other Topics Concern   Not on file  Social History Narrative   Not on file   Social Drivers of Health   Tobacco Use: Low Risk (08/26/2024)   Patient History    Smoking Tobacco Use: Never    Smokeless Tobacco Use: Never    Passive Exposure: Not on file  Financial Resource Strain: Not on file  Food Insecurity: Not on file  Transportation Needs: Not on file  Physical Activity: Not on file  Stress: Not on file  Social Connections: Not on file  Depression (EYV7-0): Not on file  Alcohol  Screen: Not on file  Housing: Not on file  Utilities: Not on file  Health Literacy: Not on file   History reviewed. No pertinent family history. Allergies  Allergen Reactions   Codeine Itching    Skin crawls   Metformin Diarrhea   Prior to Admission medications  Medication Sig Start Date End Date Taking?  Authorizing Provider  acetaminophen  (TYLENOL ) 500 MG tablet Take 1,000 mg by mouth every 6 (six) hours as needed for mild pain (pain score 1-3).   Yes [provider]  amLODipine  (NORVASC ) 10 MG tablet Take 10 mg by mouth daily.   Yes [provider]  carvedilol  (COREG ) 25 MG tablet Take 12.5 mg by mouth 2 (two) times daily with a meal.   Yes [provider]  diphenhydrAMINE  (BENADRYL ) 25 MG tablet Take 25 mg by mouth daily as needed for allergies.   Yes [provider]  docusate sodium  (COLACE) 100 MG capsule Take 1 capsule (100 mg total) by mouth daily as needed. 08/17/24 08/17/25 Yes Jule Ronal CROME, PA-C  doxycycline  (VIBRAMYCIN ) 100 MG capsule Take 1 capsule (100 mg total) by mouth 2 (two) times daily. To be taken after surgery 08/17/24  Yes Jule Ronal CROME, PA-C  empagliflozin  (JARDIANCE ) 25 MG TABS tablet Take 12.5 mg by mouth daily.   Yes [provider]  fluticasone (FLONASE) 50 MCG/ACT nasal spray Place 1 spray into both nostrils daily as needed for allergies. 03/13/21  Yes [provider]  glimepiride  (AMARYL ) 4 MG tablet Take 4 mg by mouth daily with breakfast.   Yes [provider]  ibuprofen (ADVIL) 200 MG tablet Take 400 mg by mouth  every 6 (six) hours as needed for moderate pain (pain score 4-6).   Yes [provider]  ketotifen (ZADITOR) 0.035 % ophthalmic solution Place 1 drop into both eyes in the morning and at bedtime.   Yes [provider]  lisinopril  (ZESTRIL ) 40 MG tablet Take 40 mg by mouth daily. 08/03/17  Yes [provider]  methocarbamol  (ROBAXIN ) 750 MG tablet Take 1 tablet (750 mg total) by mouth 3 (three) times daily as needed. 08/17/24  Yes Jule Ronal CROME, PA-C  ondansetron  (ZOFRAN ) 4 MG tablet Take 1 tablet (4 mg total) by mouth every 8 (eight) hours as needed for nausea or vomiting. 08/17/24   Jule Ronal CROME, PA-C  oxyCODONE -acetaminophen  (PERCOCET) 5-325 MG tablet Take 1-2  tablets by mouth every 8 (eight) hours as needed. 08/17/24  Yes Jule Ronal CROME, PA-C  sildenafil (VIAGRA) 50 MG tablet Take 25 mg by mouth as needed for erectile dysfunction. 03/19/20  Yes [provider]  sitaGLIPtin (JANUVIA) 100 MG tablet Take 100 mg by mouth daily.   Yes [provider]     Positive ROS: All other systems have been reviewed and were otherwise negative with the exception of those mentioned in the HPI and as above.  Physical Exam: General: Alert, no acute distress Cardiovascular: No pedal edema Respiratory: No cyanosis, no use of accessory musculature GI: abdomen soft Skin: No lesions in the area of chief complaint Neurologic: Sensation intact distally Psychiatric: Patient is competent for consent with normal mood and affect Lymphatic: no lymphedema  MUSCULOSKELETAL: exam stable  Assessment: right hip osteoarthritis  Plan: Plan for Procedures: ARTHROPLASTY, HIP, TOTAL, ANTERIOR APPROACH  The risks benefits and alternatives were discussed with the patient including but not limited to the risks of nonoperative treatment, versus surgical intervention including infection, bleeding, nerve injury,  blood clots, cardiopulmonary complications, morbidity, mortality, among others, and they were willing to proceed.   Ozell Cummins, MD 08/26/2024 6:01 AM  "

## 2024-08-26 NOTE — Anesthesia Postprocedure Evaluation (Signed)
"   Anesthesia Post Note  Patient: [place] [facility] [place]  Procedure(s) Performed: ARTHROPLASTY, HIP, TOTAL, ANTERIOR APPROACH (Right: Hip)     Patient location during evaluation: PACU Anesthesia Type: General Level of consciousness: awake and alert, oriented and patient cooperative Pain management: pain level controlled Vital Signs Assessment: post-procedure vital signs reviewed and stable Respiratory status: spontaneous breathing, nonlabored ventilation and respiratory function stable Cardiovascular status: blood pressure returned to baseline and stable Postop Assessment: no apparent nausea or vomiting Anesthetic complications: no   No notable events documented.  Last Vitals:  Vitals:   08/26/24 1015 08/26/24 1021  BP: (!) 168/95   Pulse: 63   Resp: 10   Temp:  36.4 C  SpO2: 93%     Last Pain:  Vitals:   08/26/24 1015  TempSrc:   PainSc: 6                  Almarie HERO Belicia Difatta      "

## 2024-08-26 NOTE — Plan of Care (Signed)
 " Problem: Bowel/Gastric: Goal: Gastrointestinal status for postoperative course will improve Outcome: Progressing   Problem: Cardiac: Goal: Ability to maintain an adequate cardiac output Outcome: Progressing Goal: Will show no evidence of cardiac arrhythmias Outcome: Progressing   Problem: Nutritional: Goal: Will attain and maintain optimal nutritional status Outcome: Progressing   Problem: Neurological: Goal: Will regain or maintain usual level of consciousness Outcome: Progressing   Problem: Clinical Measurements: Goal: Ability to maintain clinical measurements within normal limits Outcome: Progressing Goal: Postoperative complications will be avoided or minimized Outcome: Progressing   Problem: Respiratory: Goal: Will regain and/or maintain adequate ventilation Outcome: Progressing Goal: Respiratory status will improve Outcome: Progressing   Problem: Skin Integrity: Goal: Demonstrates signs of wound healing without infection Outcome: Progressing   Problem: Urinary Elimination: Goal: Will remain free from infection Outcome: Progressing Goal: Ability to achieve and maintain adequate urine output Outcome: Progressing   Problem: Education: Goal: Knowledge of General Education information will improve Description: Including pain rating scale, medication(s)/side effects and non-pharmacologic comfort measures Outcome: Progressing   Problem: Health Behavior/Discharge Planning: Goal: Ability to manage health-related needs will improve Outcome: Progressing   Problem: Clinical Measurements: Goal: Ability to maintain clinical measurements within normal limits will improve Outcome: Progressing Goal: Will remain free from infection Outcome: Progressing Goal: Diagnostic test results will improve Outcome: Progressing Goal: Respiratory complications will improve Outcome: Progressing Goal: Cardiovascular complication will be avoided Outcome: Progressing   Problem:  Activity: Goal: Risk for activity intolerance will decrease Outcome: Progressing   Problem: Nutrition: Goal: Adequate nutrition will be maintained Outcome: Progressing   Problem: Coping: Goal: Level of anxiety will decrease Outcome: Progressing   Problem: Elimination: Goal: Will not experience complications related to bowel motility Outcome: Progressing Goal: Will not experience complications related to urinary retention Outcome: Progressing   Problem: Pain Managment: Goal: General experience of comfort will improve and/or be controlled Outcome: Progressing   Problem: Safety: Goal: Ability to remain free from injury will improve Outcome: Progressing   Problem: Skin Integrity: Goal: Risk for impaired skin integrity will decrease Outcome: Progressing   Problem: Education: Goal: Ability to describe self-care measures that may prevent or decrease complications (Diabetes Survival Skills Education) will improve Outcome: Progressing Goal: Individualized Educational Video(s) Outcome: Progressing   Problem: Coping: Goal: Ability to adjust to condition or change in health will improve Outcome: Progressing   Problem: Fluid Volume: Goal: Ability to maintain a balanced intake and output will improve Outcome: Progressing   Problem: Health Behavior/Discharge Planning: Goal: Ability to identify and utilize available resources and services will improve Outcome: Progressing Goal: Ability to manage health-related needs will improve Outcome: Progressing   Problem: Metabolic: Goal: Ability to maintain appropriate glucose levels will improve Outcome: Progressing   Problem: Nutritional: Goal: Maintenance of adequate nutrition will improve Outcome: Progressing Goal: Progress toward achieving an optimal weight will improve Outcome: Progressing   Problem: Skin Integrity: Goal: Risk for impaired skin integrity will decrease Outcome: Progressing   Problem: Tissue Perfusion: Goal:  Adequacy of tissue perfusion will improve Outcome: Progressing   Problem: Education: Goal: Knowledge of the prescribed therapeutic regimen will improve Outcome: Progressing Goal: Understanding of discharge needs will improve Outcome: Progressing Goal: Individualized Educational Video(s) Outcome: Progressing   Problem: Activity: Goal: Ability to avoid complications of mobility impairment will improve Outcome: Progressing Goal: Ability to tolerate increased activity will improve Outcome: Progressing   Problem: Clinical Measurements: Goal: Postoperative complications will be avoided or minimized Outcome: Progressing   Problem: Pain Management: Goal: Pain level will decrease with appropriate  interventions Outcome: Progressing   "

## 2024-08-26 NOTE — Op Note (Signed)
 "  ARTHROPLASTY, HIP, TOTAL, ANTERIOR APPROACH  Procedure Note ZLATAN HORNBACK   [id number]  Pre-op Diagnosis: right hip osteoarthritis     Post-op Diagnosis: same  Operative Findings Severe OA Successful correction of leg length discrepancy   Operative Procedures  1. Total hip replacement; Right hip; uncemented [id number]   Surgeon: Kay Cummins, M.D.  Assist: Ronal Morna Grave, PA-C   Anesthesia: general  Prosthesis: Depuy Acetabulum: Pinnacle 54 mm Femur: Actis 8 HO Head: 36 mm size: +5 Liner: +4 neutral Bearing Type: ceramic/poly  Total Hip Arthroplasty (Anterior Approach) Op Note:  After informed consent was obtained and the operative extremity marked in the holding area, the patient was brought back to the operating room and placed supine on the HANA table. Next, the operative extremity was prepped and draped in normal sterile fashion. Surgical timeout occurred verifying patient identification, surgical site, surgical procedure and administration of antibiotics.  A 10 cm longitudinal incision was made starting from 2 fingerbreadths lateral and inferior to the ASIS towards the lateral aspect of the patella.  A Hueter approach to the hip was performed, using the interval between tensor fascia lata and sartorius.  Dissection was carried bluntly down onto the anterior hip capsule. The lateral femoral circumflex vessels were identified and coagulated. A capsulotomy was performed and the capsular flaps tagged for later repair.  The neck osteotomy was performed 1 fingerbreadth above the lesser trochanter. The femoral head was removed which showed severe disease, the acetabular rim was cleared of soft tissue and osteophytes and attention was turned to reaming the acetabulum.  Sequential reaming was performed under fluoroscopic guidance down to the floor of the cotyloid fossa. We reamed to a size 53 mm, and then impacted the acetabular shell. A 25 mm cancellous screw was placed to secure  the shell.  A +4 neutral liner was then placed after irrigation and attention turned to the femur.  After placing the femoral hook, the leg was taken to externally rotated, extended and adducted position taking care to perform soft tissue releases to allow for adequate mobilization of the femur. Soft tissue was cleared from the shoulder of the greater trochanter and the hook elevator used to improve exposure of the proximal femur.  Lateral bone from the shoulder was rasped away for relief.  Sequential broaching performed up to a size 8.  High offset trial neck and +1.5 head were placed. The leg was brought back up to neutral and the construct reduced.  The position and sizing of components, offset and leg lengths were checked using fluoroscopy.  Based on fluoroscopic findings, we chose to retrial with high offset neck and +5 head ball.  Stability of the construct was checked in 45 degrees of hip extension and 90 degrees of external rotation without any subluxation, shuck or impingement of prosthesis. We dislocated the prosthesis, dropped the leg back into position, removed trial components, and irrigated copiously. The final stem and head were chosen then placed, the leg brought back up, the system reduced and fluoroscopy used to verify positioning.  Antibiotic irrigation was placed in the surgical wound.   We irrigated, obtained hemostasis and closed the capsule using #2 ethibond suture.  A topical mixture of 0.25% bupivacaine  and meloxicam  was placed deep to the fascia.  One gram of vancomycin  powder was placed in the surgical bed.   One gram of topical tranexamic acid  was injected into the joint.  The fascia was closed with #1 stratafix, the deep fat layer was closed with  0 vicryl, the subcutaneous layers closed with 2.0 Vicryl Plus and the skin closed with 2.0 nylon and dermabond. A sterile dressing was applied. The patient was awakened in the operating room and taken to recovery in stable condition.  All  sponge, needle, and instrument counts were correct at the end of the case.   Morna Grave, my PA, was a medical necessity for opening, closing, limb positioning, retracting, exposing, and overall facilitation and timely completion of the surgery.  Position: supine  Complications: see description of procedure.  Time Out: performed   Drains/Packing: none  Estimated blood loss: see anesthesia record  Returned to Recovery Room: in good condition.   Antibiotics: yes   Mechanical VTE (DVT) Prophylaxis: sequential compression devices, TED thigh-high  Chemical VTE (DVT) Prophylaxis: resume xarelto  POD 1   Fluid Replacement: see anesthesia record  Specimens Removed: 1 to pathology   Sponge and Instrument Count Correct? yes   PACU: portable radiograph - low AP   Plan/RTC: Return in 2 weeks for suture removal. Weight Bearing/Load Lower Extremity: full  Hip precautions: none Suture Removal: 2 weeks   N. Ozell Cummins, MD [facility] 9:02 AM   Implant Name Type Inv. Item Serial No. Manufacturer Lot No. LRB No. Used Action  CUP ACETAB W/GRIPTION 54 - [id number] Plate CUP ACETAB W/GRIPTION 54  [employer] [id number] Right 1 Implanted  SCREW 6.5MMX25MM - [id number] Screw SCREW 6.5MMX25MM  [employer] [id number] Right 1 Implanted  LINER NEUTRAL 54X36MM PLUS 4 - [id number] Hips LINER NEUTRAL 54X36MM PLUS 4  [employer] [id number] Right 1 Implanted  STEM FEM ACTIS HIGH SZ8 - [id number] Stem STEM FEM ACTIS HIGH SZ8  [employer] [id number] Right 1 Implanted  HEAD CERAMIC 36 PLUS5 - [id number] Hips HEAD CERAMIC 36 PLUS5  [employer] [id number] Right 1 Implanted   "

## 2024-08-27 ENCOUNTER — Other Ambulatory Visit (HOSPITAL_COMMUNITY): Payer: Self-pay

## 2024-08-27 DIAGNOSIS — M1611 Unilateral primary osteoarthritis, right hip: Secondary | ICD-10-CM | POA: Diagnosis not present

## 2024-08-27 LAB — GLUCOSE, CAPILLARY: Glucose-Capillary: 145 mg/dL — ABNORMAL HIGH (ref 70–99)

## 2024-08-27 MED ORDER — APIXABAN 2.5 MG PO TABS: ORAL_TABLET | ORAL | 0 refills | Status: AC

## 2024-08-27 MED ORDER — RIVAROXABAN 10 MG PO TABS
ORAL_TABLET | ORAL | 0 refills | Status: DC
  Filled 2024-08-27 (×2): qty 30, 30d supply, fill #0

## 2024-08-27 NOTE — Progress Notes (Signed)
 Physical Therapy Treatment Patient Details Name: Antonio Nash MRN: [id number] DOB: 28-Aug-1955 Today's Date: 08/27/2024   History of Present Illness [name] and with hx of L THR (20), DM< and PE    PT Comments  Pt very motivated and progressing well with mobility.  Pt up to ambulate increased distance in hall, negotiated stairs, initiated HEP and reviewed car transfers.  Pt eager for dc home this date.    If plan is discharge home, recommend the following: A little help with walking and/or transfers;A little help with bathing/dressing/bathroom;Assistance with cooking/housework;Assist for transportation   Can travel by private vehicle        Equipment Recommendations  None recommended by PT    Recommendations for Other Services       Precautions / Restrictions Precautions Precautions: Fall Restrictions Weight Bearing Restrictions Per Provider Order: Yes RLE Weight Bearing Per Provider Order: Weight bearing as tolerated     Mobility  Bed Mobility Overal bed mobility: Needs Assistance Bed Mobility: Supine to Sit     Supine to sit: Supervision     General bed mobility comments: min cues with pt self assiting with gait belt    Transfers Overall transfer level: Needs assistance Equipment used: Rolling walker (2 wheels) Transfers: Sit to/from Stand Sit to Stand: Contact guard assist, Supervision           General transfer comment: min cues for LE management and use of UEs to self assist    Ambulation/Gait Ambulation/Gait assistance: Contact guard assist, Supervision Gait Distance (Feet): 150 Feet Assistive device: Rolling walker (2 wheels) Gait Pattern/deviations: Step-to pattern, Step-through pattern, Decreased step length - right, Decreased step length - left, Shuffle Gait velocity: dec     General Gait Details: cues for posture and position from RW   Stairs Stairs: Yes Stairs assistance: Contact guard assist Stair Management: One rail Right,  Step to pattern, Forwards, With walker, With cane, No rails Number of Stairs: 6 General stair comments: 5 stairs with rail and cane, single step bkwd with RW for stool into high bed   Wheelchair Mobility     Tilt Bed    Modified Rankin (Stroke Patients Only)       Balance Overall balance assessment: Needs assistance Sitting-balance support: No upper extremity supported, Feet supported Sitting balance-Leahy Scale: Good     Standing balance support: No upper extremity supported Standing balance-Leahy Scale: Fair                              Hotel Manager: No apparent difficulties  Cognition Arousal: Alert Behavior During Therapy: WFL for tasks assessed/performed   PT - Cognitive impairments: No apparent impairments                         Following commands: Intact      Cueing Cueing Techniques: Verbal cues  Exercises Total Joint Exercises Ankle Circles/Pumps: AROM, 15 reps, Supine Quad Sets: AROM, Both, 10 reps, Supine Heel Slides: AAROM, Right, 20 reps, Supine Hip ABduction/ADduction: AAROM, Left, 15 reps, Supine    General Comments        Pertinent Vitals/Pain Pain Assessment Pain Assessment: 0-10 Pain Score: 6  Pain Location: R hip Pain Descriptors / Indicators: Aching, Sore Pain Intervention(s): Limited activity within patient's tolerance, Monitored during session, Patient requesting pain meds-RN notified, RN gave pain meds during session, Ice applied (Pt declined premed)  Home Living                          Prior Function            PT Goals (current goals can now be found in the care plan section) Acute Rehab PT Goals Patient Stated Goal: Regain IND PT Goal Formulation: With patient Time For Goal Achievement: 09/09/24 Potential to Achieve Goals: Good Progress towards PT goals: Progressing toward goals    Frequency    7X/week      PT Plan      Co-evaluation               AM-PAC PT 6 Clicks Mobility   Outcome Measure  Help needed turning from your back to your side while in a flat bed without using bedrails?: A Little Help needed moving from lying on your back to sitting on the side of a flat bed without using bedrails?: A Little Help needed moving to and from a bed to a chair (including a wheelchair)?: A Little Help needed standing up from a chair using your arms (e.g., wheelchair or bedside chair)?: A Little Help needed to walk in hospital room?: A Little Help needed climbing 3-5 steps with a railing? : A Little 6 Click Score: 18    End of Session Equipment Utilized During Treatment: Gait belt Activity Tolerance: Patient tolerated treatment well Patient left: in chair;with call bell/phone within reach;with chair alarm set;with family/visitor present Nurse Communication: Mobility status PT Visit Diagnosis: Difficulty in walking, not elsewhere classified (R26.2)     Time: 9179-9142 PT Time Calculation (min) (ACUTE ONLY): 37 min  Charges:    $Gait Training: 8-22 mins $Therapeutic Exercise: 8-22 mins PT General Charges $$ ACUTE PT VISIT: 1 Visit                     [facility] PT Acute Rehabilitation Services Office [phone]    March Steyer 08/27/2024, 9:03 AM

## 2024-08-27 NOTE — Discharge Summary (Signed)
 "    Patient ID: Antonio Nash MRN: [id number] DOB/AGE: December 15, 1955 69 y.o.  Admit date: 08/26/2024 Discharge date: 08/27/2024  Admission Diagnoses:  Primary osteoarthritis of right hip  Discharge Diagnoses:  Principal Problem:   Primary osteoarthritis of right hip Active Problems:   Status post total replacement of right hip   Past Medical History:  Diagnosis Date   Arthritis    Diabetes mellitus without complication (HCC)    Hypertension    PE (pulmonary thromboembolism) (HCC) 10/09/2020   Pneumonia 09/2020    Surgeries: Procedures: ARTHROPLASTY, HIP, TOTAL, ANTERIOR APPROACH on 08/26/2024   Consultants (if any):   Discharged Condition: Improved  Hospital Course: Antonio Nash is an 69 y.o. male who was admitted 08/26/2024 with a diagnosis of Primary osteoarthritis of right hip and went to the operating room on 08/26/2024 and underwent the above named procedures.    He was given perioperative antibiotics:  Anti-infectives (From admission, onward)    Start     Dose/Rate Route Frequency Ordered Stop   08/26/24 1330  ceFAZolin  (ANCEF ) IVPB 2g/100 mL premix        2 g 200 mL/hr over 30 Minutes Intravenous Every 6 hours 08/26/24 1122 08/26/24 2230   08/26/24 1215  doxycycline  (VIBRA -TABS) tablet 100 mg       Note to Pharmacy: To be taken after surgery     100 mg Oral 2 times daily 08/26/24 1122     08/26/24 0840  vancomycin  (VANCOCIN ) powder  Status:  Discontinued          As needed 08/26/24 0855 08/26/24 0918   08/26/24 0600  ceFAZolin  (ANCEF ) IVPB 2g/100 mL premix        2 g 200 mL/hr over 30 Minutes Intravenous On call to O.R. 08/26/24 0530 08/26/24 0741     .  He was given sequential compression devices, early ambulation, and appropriate chemoprophylaxis for DVT prophylaxis.  He benefited maximally from the hospital stay and there were no complications.    Recent vital signs:  Vitals:   08/27/24 0116 08/27/24 0613  BP: (!) 140/88 130/75  Pulse: 84 95  Resp:  15 14  Temp: 99.2 F (37.3 C) 99.3 F (37.4 C)  SpO2: 98% 99%    Recent laboratory studies:  Lab Results  Component Value Date   HGB 15.6 08/16/2024   HGB 14.6 11/06/2023   HGB 13.2 05/07/2021   Lab Results  Component Value Date   WBC 7.3 08/16/2024   PLT 257 08/16/2024   Lab Results  Component Value Date   INR 1.0 04/24/2021   Lab Results  Component Value Date   NA 140 08/16/2024   K 4.6 08/16/2024   CL 104 08/16/2024   CO2 25 08/16/2024   BUN 16 08/16/2024   CREATININE 0.94 08/16/2024   GLUCOSE 127 (H) 08/16/2024    Discharge Medications:   Allergies as of 08/27/2024       Reactions   Codeine Itching   Skin crawls   Metformin Diarrhea        Medication List     STOP taking these medications    acetaminophen  500 MG tablet Commonly known as: TYLENOL    ibuprofen 200 MG tablet Commonly known as: ADVIL   sildenafil 50 MG tablet Commonly known as: VIAGRA       TAKE these medications    amLODipine  10 MG tablet Commonly known as: NORVASC  Take 10 mg by mouth daily.   apixaban  2.5 MG Tabs tablet Commonly known as:  Eliquis  Take one tab po bid x 30 days after surgery to prevent blood clots   carvedilol  25 MG tablet Commonly known as: COREG  Take 12.5 mg by mouth 2 (two) times daily with a meal.   diphenhydrAMINE  25 MG tablet Commonly known as: BENADRYL  Take 25 mg by mouth daily as needed for allergies.   docusate sodium  100 MG capsule Commonly known as: Colace Take 1 capsule (100 mg total) by mouth daily as needed.   doxycycline  100 MG capsule Commonly known as: Vibramycin  Take 1 capsule (100 mg total) by mouth 2 (two) times daily. To be taken after surgery   empagliflozin  25 MG Tabs tablet Commonly known as: JARDIANCE  Take 12.5 mg by mouth daily.   fluticasone 50 MCG/ACT nasal spray Commonly known as: FLONASE Place 1 spray into both nostrils daily as needed for allergies.   glimepiride  4 MG tablet Commonly known as: AMARYL  Take 4  mg by mouth daily with breakfast.   ketotifen 0.035 % ophthalmic solution Commonly known as: ZADITOR Place 1 drop into both eyes in the morning and at bedtime.   lisinopril  40 MG tablet Commonly known as: ZESTRIL  Take 40 mg by mouth daily.   methocarbamol  750 MG tablet Commonly known as: ROBAXIN  Take 1 tablet (750 mg total) by mouth 3 (three) times daily as needed.   ondansetron  4 MG tablet Commonly known as: Zofran  Take 1 tablet (4 mg total) by mouth every 8 (eight) hours as needed for nausea or vomiting.   oxyCODONE -acetaminophen  5-325 MG tablet Commonly known as: Percocet Take 1-2 tablets by mouth every 8 (eight) hours as needed.   sitaGLIPtin 100 MG tablet Commonly known as: JANUVIA Take 100 mg by mouth daily.               Durable Medical Equipment  (From admission, onward)           Start     Ordered   08/26/24 1123  DME Walker rolling  Once       Question:  Patient needs a walker to treat with the following condition  Answer:  History of hip replacement   08/26/24 1122   08/26/24 1123  DME 3 n 1  Once        08/26/24 1122   08/26/24 1123  DME Bedside commode  Once       Question:  Patient needs a bedside commode to treat with the following condition  Answer:  History of hip replacement   08/26/24 1122            Diagnostic Studies: DG Pelvis Portable Result Date: 08/26/2024 CLINICAL DATA:  Status post right hip replacement. EXAM: PORTABLE PELVIS 1-2 VIEWS COMPARISON:  Preoperative imaging FINDINGS: Right hip arthroplasty in expected alignment. No periprosthetic lucency or fracture. Recent postsurgical change includes air and edema in the soft tissues. Previous left hip arthroplasty. IMPRESSION: Right hip arthroplasty without immediate postoperative complication. Electronically Signed   By: Andrea Gasman M.D.   On: 08/26/2024 15:12   DG HIP UNILAT WITH PELVIS 1V RIGHT Result Date: 08/26/2024 CLINICAL DATA:  Elective surgery. EXAM: DG HIP (WITH OR  WITHOUT PELVIS) 1V RIGHT COMPARISON:  06/29/2024 FINDINGS: Three fluoroscopic spot views of the pelvis and right hip obtained in the operating room. Images during hip arthroplasty. Fluoroscopy time 18 seconds. Dose 3.1 mGy. IMPRESSION: Intraoperative fluoroscopy during right hip arthroplasty. Electronically Signed   By: Andrea Gasman M.D.   On: 08/26/2024 15:12   DG C-Arm 1-60 Min-No Report Result Date:  08/26/2024 Fluoroscopy was utilized by the requesting physician.  No radiographic interpretation.   DG C-Arm 1-60 Min-No Report Result Date: 08/26/2024 Fluoroscopy was utilized by the requesting physician.  No radiographic interpretation.    Disposition: Discharge disposition: 01-Home or Self Care       Discharge Instructions     Call MD / Call 911   Complete by: As directed    If you experience chest pain or shortness of breath, CALL 911 and be transported to the hospital emergency room.  If you develope a fever above 101.5 F, pus (white drainage) or increased drainage or redness at the wound, or calf pain, call your surgeon's office.   Constipation Prevention   Complete by: As directed    Drink plenty of fluids.  Prune juice may be helpful.  You may use a stool softener, such as Colace (over the counter) 100 mg twice a day.  Use MiraLax  (over the counter) for constipation as needed.   Driving restrictions   Complete by: As directed    No driving while taking narcotic pain meds.   Increase activity slowly as tolerated   Complete by: As directed    Post-operative opioid taper instructions:   Complete by: As directed    POST-OPERATIVE OPIOID TAPER INSTRUCTIONS: It is important to wean off of your opioid medication as soon as possible. If you do not need pain medication after your surgery it is ok to stop day one. Opioids include: Codeine, Hydrocodone (Norco, Vicodin), Oxycodone (Percocet, oxycontin ) and hydromorphone  amongst others.  Long term and even short term use of opiods can  cause: Increased pain response Dependence Constipation Depression Respiratory depression And more.  Withdrawal symptoms can include Flu like symptoms Nausea, vomiting And more Techniques to manage these symptoms Hydrate well Eat regular healthy meals Stay active Use relaxation techniques(deep breathing, meditating, yoga) Do Not substitute Alcohol  to help with tapering If you have been on opioids for less than two weeks and do not have pain than it is ok to stop all together.  Plan to wean off of opioids This plan should start within one week post op of your joint replacement. Maintain the same interval or time between taking each dose and first decrease the dose.  Cut the total daily intake of opioids by one tablet each day Next start to increase the time between doses. The last dose that should be eliminated is the evening dose.           Follow-up Information     Jule Ronal CROME, PA-C. Schedule an appointment as soon as possible for a visit in 2 week(s).   Specialty: Orthopedic Surgery Contact information: [address] [place] [address] [phone]                  Signed: Ozell Cummins 08/27/2024, 7:57 AM  "

## 2024-08-27 NOTE — Progress Notes (Signed)
" ° °  Subjective:  Patient reports pain as mild.    Today's  total administered Morphine  Milligram Equivalents: 0  Objective:   VITALS:   Vitals:   08/26/24 1610 08/26/24 2141 08/27/24 0116 08/27/24 0613  BP: (!) 170/92 129/87 (!) 140/88 130/75  Pulse: 75 88 84 95  Resp: 16 15 15 14   Temp: 98.8 F (37.1 C) 99.3 F (37.4 C) 99.2 F (37.3 C) 99.3 F (37.4 C)  TempSrc: Oral Oral Oral Oral  SpO2: 100% 98% 98% 99%  Weight:      Height:        Sensation intact distally Intact pulses distally Dorsiflexion/Plantar flexion intact Incision: dressing C/D/I and no drainage   Lab Results  Component Value Date   WBC 7.3 08/16/2024   HGB 15.6 08/16/2024   HCT 49.2 08/16/2024   MCV 88.0 08/16/2024   PLT 257 08/16/2024     Assessment/Plan:  1 Day Post-Op   - Up with PT/OT - will progress well today - DVT ppx - SCDs, ambulation, eliquis  x 30 days - WBAT operative extremity - Pain control - home today after PT  Antonio Nash 08/27/2024, 7:57 AM  "

## 2024-08-29 ENCOUNTER — Telehealth: Payer: Self-pay | Admitting: Orthopaedic Surgery

## 2024-08-29 NOTE — Telephone Encounter (Signed)
 Spoke with patient and patient's wife. He is doing well at home with walking. He wants to continue with just walking. He will pursue outpatient PT if needed after evaluation at 2 week post op visit.

## 2024-08-29 NOTE — Telephone Encounter (Signed)
 Patient's wife called. Would like to know if patient would be having PT in home? Cb# [phone]

## 2024-08-31 ENCOUNTER — Encounter (HOSPITAL_COMMUNITY): Payer: Self-pay | Admitting: Orthopaedic Surgery

## 2024-09-09 ENCOUNTER — Ambulatory Visit: Admitting: Physician Assistant

## 2024-09-09 DIAGNOSIS — Z96641 Presence of right artificial hip joint: Secondary | ICD-10-CM

## 2024-09-09 MED ORDER — METHOCARBAMOL 750 MG PO TABS: 750.0000 mg | ORAL_TABLET | Freq: Three times a day (TID) | ORAL | 2 refills | Status: AC | PRN

## 2024-09-09 MED ORDER — HYDROCODONE-ACETAMINOPHEN 5-325 MG PO TABS: 1.0000 | ORAL_TABLET | Freq: Three times a day (TID) | ORAL | 0 refills | Status: AC | PRN

## 2024-09-09 NOTE — Progress Notes (Signed)
 "  Post-Op Visit Note   Patient: Antonio Nash           Date of Birth: 11/17/1955           MRN: [id number] Visit Date: 09/09/2024 PCP: [facility]   Assessment & Plan:  Chief Complaint:  Chief Complaint  Patient presents with   Right Hip - Follow-up    Right THA 08/26/2024   Visit Diagnoses:  1. Status post total replacement of right hip     Plan: Patient is a pleasant 69 year old gentleman who comes in today 2 weeks status post right total hip replacement 08/26/2024.  He has been doing okay.  He is ambulating with a cane.  He has been primarily taking Tylenol  and ibuprofen over the past few days as he ran out of muscle relaxers and Percocet.  He has been compliant taking Eliquis  for DVT prophylaxis.  Examination of his right hip reveals a well-healed surgical incision without evidence of infection or cellulitis.  Calves are soft and nontender.  He is neurovascular intact distally.  Today, sutures were removed and Steri-Strips applied.  I like for him to continue wearing his compression socks for another 2 weeks with a history of DVT.  Continue with his Eliquis  for 2 more weeks and then transition to a baby aspirin twice daily for 2 weeks.  I recommended not taking any ibuprofen or other NSAIDs while on the Eliquis .  I refilled his Robaxin  and sent in Norco to take as needed.  Follow-up with us  in 4 weeks for repeat evaluation and AP pelvis x-rays.  Call with concerns or questions.  Follow-Up Instructions: Return in about 4 weeks (around 10/07/2024).   Orders:  No orders of the defined types were placed in this encounter.  Meds ordered this encounter  Medications   HYDROcodone -acetaminophen  (NORCO/VICODIN) 5-325 MG tablet    Sig: Take 1 tablet by mouth 3 (three) times daily as needed.    Dispense:  21 tablet    Refill:  0   methocarbamol  (ROBAXIN ) 750 MG tablet    Sig: Take 1 tablet (750 mg total) by mouth 3 (three) times daily as needed.    Dispense:  30 tablet    Refill:   2    Imaging: No results found.  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  Past Surgical History:  Procedure Laterality Date   COLONOSCOPY     HERNIA REPAIR Right    inguinal hernia   KNEE SURGERY Right 2005   arthroscopy w/ debridement   ROTATOR CUFF REPAIR Right 2004   arthroscopy   TOTAL HIP  ARTHROPLASTY Left 05/06/2021   Procedure: LEFT TOTAL HIP ARTHROPLASTY ANTERIOR APPROACH;  Surgeon: Jerri Kay HERO, MD;  Location: [facility] OR;  Service: Orthopedics;  Laterality: Left;   TOTAL HIP ARTHROPLASTY Right 08/26/2024   Procedure: ARTHROPLASTY, HIP, TOTAL, ANTERIOR APPROACH;  Surgeon: Jerri Kay HERO, MD;  Location: [facility] ORS;  Service: Orthopedics;  Laterality: Right;   Social History   Occupational History   Not on file  Tobacco Use   Smoking status: Never   Smokeless tobacco: Never  Vaping Use   Vaping status: Never Used  Substance and Sexual Activity   Alcohol  use: Yes    Comment: 1 shot a day   Drug use: Never   Sexual activity: Yes     "

## 2024-10-11 ENCOUNTER — Encounter: Admitting: Physician Assistant
